# Patient Record
Sex: Female | Born: 1935 | Race: White | Hispanic: No | Smoking: Former smoker
Health system: Southern US, Community
[De-identification: ages and names within clinical notes are randomized; demographics above are authoritative.]

## PROBLEM LIST (undated history)

## (undated) DIAGNOSIS — I252 Old myocardial infarction: Secondary | ICD-10-CM

## (undated) DIAGNOSIS — J45909 Unspecified asthma, uncomplicated: Secondary | ICD-10-CM

## (undated) DIAGNOSIS — K219 Gastro-esophageal reflux disease without esophagitis: Secondary | ICD-10-CM

## (undated) DIAGNOSIS — K5792 Diverticulitis of intestine, part unspecified, without perforation or abscess without bleeding: Secondary | ICD-10-CM

## (undated) DIAGNOSIS — R0602 Shortness of breath: Secondary | ICD-10-CM

## (undated) DIAGNOSIS — I2510 Atherosclerotic heart disease of native coronary artery without angina pectoris: Secondary | ICD-10-CM

## (undated) DIAGNOSIS — Z9981 Dependence on supplemental oxygen: Secondary | ICD-10-CM

## (undated) DIAGNOSIS — J449 Chronic obstructive pulmonary disease, unspecified: Secondary | ICD-10-CM

## (undated) DIAGNOSIS — M1990 Unspecified osteoarthritis, unspecified site: Secondary | ICD-10-CM

## (undated) DIAGNOSIS — I255 Ischemic cardiomyopathy: Secondary | ICD-10-CM

## (undated) DIAGNOSIS — E039 Hypothyroidism, unspecified: Secondary | ICD-10-CM

## (undated) DIAGNOSIS — J8410 Pulmonary fibrosis, unspecified: Secondary | ICD-10-CM

## (undated) DIAGNOSIS — I10 Essential (primary) hypertension: Secondary | ICD-10-CM

## (undated) DIAGNOSIS — J189 Pneumonia, unspecified organism: Secondary | ICD-10-CM

## (undated) DIAGNOSIS — E785 Hyperlipidemia, unspecified: Secondary | ICD-10-CM

## (undated) DIAGNOSIS — I340 Nonrheumatic mitral (valve) insufficiency: Secondary | ICD-10-CM

## (undated) DIAGNOSIS — K7460 Unspecified cirrhosis of liver: Secondary | ICD-10-CM

## (undated) DIAGNOSIS — R51 Headache: Secondary | ICD-10-CM

## (undated) HISTORY — DX: Atherosclerotic heart disease of native coronary artery without angina pectoris: I25.10

## (undated) HISTORY — DX: Old myocardial infarction: I25.2

## (undated) HISTORY — PX: VARICOSE VEIN SURGERY: SHX832

## (undated) HISTORY — DX: Hypothyroidism, unspecified: E03.9

## (undated) HISTORY — PX: DILATION AND CURETTAGE OF UTERUS: SHX78

## (undated) HISTORY — PX: TONSILLECTOMY: SUR1361

## (undated) HISTORY — DX: Nonrheumatic mitral (valve) insufficiency: I34.0

## (undated) HISTORY — PX: RETINAL TEAR REPAIR CRYOTHERAPY: SHX5304

## (undated) HISTORY — PX: BACK SURGERY: SHX140

## (undated) HISTORY — DX: Ischemic cardiomyopathy: I25.5

## (undated) HISTORY — PX: APPENDECTOMY: SHX54

---

## 1997-12-08 ENCOUNTER — Other Ambulatory Visit: Admission: RE | Admit: 1997-12-08 | Discharge: 1997-12-08 | Payer: Self-pay | Admitting: Obstetrics and Gynecology

## 1998-08-09 ENCOUNTER — Ambulatory Visit (HOSPITAL_BASED_OUTPATIENT_CLINIC_OR_DEPARTMENT_OTHER): Admission: RE | Admit: 1998-08-09 | Discharge: 1998-08-09 | Payer: Self-pay | Admitting: Orthopaedic Surgery

## 1998-12-13 ENCOUNTER — Other Ambulatory Visit: Admission: RE | Admit: 1998-12-13 | Discharge: 1998-12-13 | Payer: Self-pay | Admitting: Obstetrics and Gynecology

## 1999-01-16 ENCOUNTER — Encounter: Payer: Self-pay | Admitting: Obstetrics and Gynecology

## 1999-01-16 ENCOUNTER — Encounter: Admission: RE | Admit: 1999-01-16 | Discharge: 1999-01-16 | Payer: Self-pay | Admitting: Obstetrics and Gynecology

## 1999-12-19 ENCOUNTER — Other Ambulatory Visit: Admission: RE | Admit: 1999-12-19 | Discharge: 1999-12-19 | Payer: Self-pay | Admitting: Obstetrics and Gynecology

## 2000-01-17 ENCOUNTER — Encounter: Payer: Self-pay | Admitting: Obstetrics and Gynecology

## 2000-01-17 ENCOUNTER — Encounter: Admission: RE | Admit: 2000-01-17 | Discharge: 2000-01-17 | Payer: Self-pay | Admitting: Obstetrics and Gynecology

## 2000-04-11 ENCOUNTER — Encounter: Payer: Self-pay | Admitting: *Deleted

## 2000-04-11 ENCOUNTER — Encounter: Admission: RE | Admit: 2000-04-11 | Discharge: 2000-04-11 | Payer: Self-pay | Admitting: *Deleted

## 2000-06-26 ENCOUNTER — Ambulatory Visit (HOSPITAL_COMMUNITY): Admission: RE | Admit: 2000-06-26 | Discharge: 2000-06-26 | Payer: Self-pay | Admitting: Endocrinology

## 2000-10-02 ENCOUNTER — Ambulatory Visit (HOSPITAL_COMMUNITY): Admission: RE | Admit: 2000-10-02 | Discharge: 2000-10-02 | Payer: Self-pay | Admitting: *Deleted

## 2000-10-02 ENCOUNTER — Encounter (INDEPENDENT_AMBULATORY_CARE_PROVIDER_SITE_OTHER): Payer: Self-pay | Admitting: Specialist

## 2000-11-26 ENCOUNTER — Other Ambulatory Visit: Admission: RE | Admit: 2000-11-26 | Discharge: 2000-11-26 | Payer: Self-pay | Admitting: Endocrinology

## 2000-12-19 ENCOUNTER — Other Ambulatory Visit: Admission: RE | Admit: 2000-12-19 | Discharge: 2000-12-19 | Payer: Self-pay | Admitting: Obstetrics and Gynecology

## 2001-01-20 ENCOUNTER — Encounter: Payer: Self-pay | Admitting: Obstetrics and Gynecology

## 2001-01-20 ENCOUNTER — Encounter: Admission: RE | Admit: 2001-01-20 | Discharge: 2001-01-20 | Payer: Self-pay | Admitting: Obstetrics and Gynecology

## 2002-01-21 ENCOUNTER — Encounter: Admission: RE | Admit: 2002-01-21 | Discharge: 2002-01-21 | Payer: Self-pay | Admitting: Obstetrics and Gynecology

## 2002-01-21 ENCOUNTER — Encounter: Payer: Self-pay | Admitting: Obstetrics and Gynecology

## 2002-02-12 ENCOUNTER — Encounter: Admission: RE | Admit: 2002-02-12 | Discharge: 2002-02-12 | Payer: Self-pay | Admitting: Endocrinology

## 2002-02-12 ENCOUNTER — Encounter: Payer: Self-pay | Admitting: Endocrinology

## 2003-01-13 ENCOUNTER — Ambulatory Visit (HOSPITAL_COMMUNITY): Admission: RE | Admit: 2003-01-13 | Discharge: 2003-01-13 | Payer: Self-pay | Admitting: Internal Medicine

## 2003-02-04 ENCOUNTER — Ambulatory Visit (HOSPITAL_COMMUNITY): Admission: RE | Admit: 2003-02-04 | Discharge: 2003-02-04 | Payer: Self-pay | Admitting: Obstetrics and Gynecology

## 2003-12-13 ENCOUNTER — Ambulatory Visit (HOSPITAL_COMMUNITY): Admission: RE | Admit: 2003-12-13 | Discharge: 2003-12-13 | Payer: Self-pay | Admitting: Cardiology

## 2004-01-11 ENCOUNTER — Encounter: Admission: RE | Admit: 2004-01-11 | Discharge: 2004-01-11 | Payer: Self-pay | Admitting: Endocrinology

## 2004-02-22 ENCOUNTER — Ambulatory Visit (HOSPITAL_COMMUNITY): Admission: RE | Admit: 2004-02-22 | Discharge: 2004-02-22 | Payer: Self-pay | Admitting: *Deleted

## 2005-02-27 ENCOUNTER — Other Ambulatory Visit: Admission: RE | Admit: 2005-02-27 | Discharge: 2005-02-27 | Payer: Self-pay | Admitting: Obstetrics and Gynecology

## 2005-03-01 ENCOUNTER — Ambulatory Visit (HOSPITAL_COMMUNITY): Admission: RE | Admit: 2005-03-01 | Discharge: 2005-03-01 | Payer: Self-pay | Admitting: *Deleted

## 2005-03-30 ENCOUNTER — Encounter: Admission: RE | Admit: 2005-03-30 | Discharge: 2005-03-30 | Payer: Self-pay | Admitting: Endocrinology

## 2005-10-12 ENCOUNTER — Inpatient Hospital Stay (HOSPITAL_COMMUNITY): Admission: AD | Admit: 2005-10-12 | Discharge: 2005-10-16 | Payer: Self-pay | Admitting: *Deleted

## 2005-10-15 ENCOUNTER — Encounter (INDEPENDENT_AMBULATORY_CARE_PROVIDER_SITE_OTHER): Payer: Self-pay | Admitting: Specialist

## 2005-10-17 ENCOUNTER — Ambulatory Visit: Payer: Self-pay | Admitting: Internal Medicine

## 2006-01-08 DIAGNOSIS — K5792 Diverticulitis of intestine, part unspecified, without perforation or abscess without bleeding: Secondary | ICD-10-CM

## 2006-01-08 HISTORY — DX: Diverticulitis of intestine, part unspecified, without perforation or abscess without bleeding: K57.92

## 2006-01-08 HISTORY — PX: ROTATOR CUFF REPAIR: SHX139

## 2006-05-09 ENCOUNTER — Encounter: Admission: RE | Admit: 2006-05-09 | Discharge: 2006-05-09 | Payer: Self-pay | Admitting: Obstetrics and Gynecology

## 2006-12-11 ENCOUNTER — Encounter: Admission: RE | Admit: 2006-12-11 | Discharge: 2006-12-11 | Payer: Self-pay | Admitting: Orthopedic Surgery

## 2006-12-12 ENCOUNTER — Ambulatory Visit (HOSPITAL_BASED_OUTPATIENT_CLINIC_OR_DEPARTMENT_OTHER): Admission: RE | Admit: 2006-12-12 | Discharge: 2006-12-12 | Payer: Self-pay | Admitting: Orthopedic Surgery

## 2007-07-17 ENCOUNTER — Encounter: Admission: RE | Admit: 2007-07-17 | Discharge: 2007-07-17 | Payer: Self-pay | Admitting: Obstetrics and Gynecology

## 2007-08-18 ENCOUNTER — Encounter (INDEPENDENT_AMBULATORY_CARE_PROVIDER_SITE_OTHER): Payer: Self-pay | Admitting: *Deleted

## 2007-08-18 ENCOUNTER — Ambulatory Visit (HOSPITAL_COMMUNITY): Admission: RE | Admit: 2007-08-18 | Discharge: 2007-08-18 | Payer: Self-pay | Admitting: *Deleted

## 2007-12-22 ENCOUNTER — Inpatient Hospital Stay (HOSPITAL_COMMUNITY): Admission: RE | Admit: 2007-12-22 | Discharge: 2007-12-23 | Payer: Self-pay | Admitting: Neurosurgery

## 2008-05-08 HISTORY — PX: EYE SURGERY: SHX253

## 2008-05-20 ENCOUNTER — Ambulatory Visit (HOSPITAL_BASED_OUTPATIENT_CLINIC_OR_DEPARTMENT_OTHER): Admission: RE | Admit: 2008-05-20 | Discharge: 2008-05-20 | Payer: Self-pay | Admitting: Orthopedic Surgery

## 2008-07-19 ENCOUNTER — Encounter: Admission: RE | Admit: 2008-07-19 | Discharge: 2008-07-19 | Payer: Self-pay | Admitting: Obstetrics and Gynecology

## 2008-11-08 HISTORY — PX: TOTAL KNEE ARTHROPLASTY: SHX125

## 2008-12-07 ENCOUNTER — Inpatient Hospital Stay (HOSPITAL_COMMUNITY): Admission: RE | Admit: 2008-12-07 | Discharge: 2008-12-10 | Payer: Self-pay | Admitting: Orthopaedic Surgery

## 2008-12-09 ENCOUNTER — Ambulatory Visit: Payer: Self-pay | Admitting: Surgery

## 2008-12-09 ENCOUNTER — Encounter (INDEPENDENT_AMBULATORY_CARE_PROVIDER_SITE_OTHER): Payer: Self-pay | Admitting: Orthopaedic Surgery

## 2009-02-14 ENCOUNTER — Encounter: Admission: RE | Admit: 2009-02-14 | Discharge: 2009-02-14 | Payer: Self-pay | Admitting: Neurosurgery

## 2009-08-29 ENCOUNTER — Encounter: Admission: RE | Admit: 2009-08-29 | Discharge: 2009-08-29 | Payer: Self-pay | Admitting: Obstetrics and Gynecology

## 2010-04-11 LAB — CBC
HCT: 27.7 % — ABNORMAL LOW (ref 36.0–46.0)
HCT: 29.8 % — ABNORMAL LOW (ref 36.0–46.0)
HCT: 32.8 % — ABNORMAL LOW (ref 36.0–46.0)
Hemoglobin: 10.3 g/dL — ABNORMAL LOW (ref 12.0–15.0)
Hemoglobin: 11.2 g/dL — ABNORMAL LOW (ref 12.0–15.0)
Hemoglobin: 9.5 g/dL — ABNORMAL LOW (ref 12.0–15.0)
MCHC: 34.1 g/dL (ref 30.0–36.0)
MCHC: 34.5 g/dL (ref 30.0–36.0)
MCV: 92.3 fL (ref 78.0–100.0)
MCV: 92.9 fL (ref 78.0–100.0)
MCV: 93 fL (ref 78.0–100.0)
Platelets: 168 10*3/uL (ref 150–400)
Platelets: 179 10*3/uL (ref 150–400)
Platelets: 199 10*3/uL (ref 150–400)
RBC: 3.23 MIL/uL — ABNORMAL LOW (ref 3.87–5.11)
RBC: 3.53 MIL/uL — ABNORMAL LOW (ref 3.87–5.11)
RDW: 13.6 % (ref 11.5–15.5)
RDW: 13.6 % (ref 11.5–15.5)
RDW: 13.7 % (ref 11.5–15.5)
WBC: 7.1 10*3/uL (ref 4.0–10.5)
WBC: 8.6 10*3/uL (ref 4.0–10.5)
WBC: 8.6 10*3/uL (ref 4.0–10.5)

## 2010-04-11 LAB — BASIC METABOLIC PANEL
BUN: 10 mg/dL (ref 6–23)
BUN: 10 mg/dL (ref 6–23)
BUN: 12 mg/dL (ref 6–23)
CO2: 27 mEq/L (ref 19–32)
CO2: 28 mEq/L (ref 19–32)
CO2: 29 mEq/L (ref 19–32)
Calcium: 8.3 mg/dL — ABNORMAL LOW (ref 8.4–10.5)
Calcium: 8.3 mg/dL — ABNORMAL LOW (ref 8.4–10.5)
Calcium: 8.5 mg/dL (ref 8.4–10.5)
Chloride: 101 mEq/L (ref 96–112)
Chloride: 103 mEq/L (ref 96–112)
Chloride: 105 mEq/L (ref 96–112)
Creatinine, Ser: 0.68 mg/dL (ref 0.4–1.2)
Creatinine, Ser: 0.7 mg/dL (ref 0.4–1.2)
Creatinine, Ser: 0.71 mg/dL (ref 0.4–1.2)
GFR calc Af Amer: >60 mL/min (ref >60)
GFR calc Af Amer: >60 mL/min (ref >60)
GFR calc Af Amer: >60 mL/min (ref >60)
GFR calc non Af Amer: >60 mL/min (ref >60)
GFR calc non Af Amer: >60 mL/min (ref >60)
GFR calc non Af Amer: >60 mL/min (ref >60)
Glucose, Bld: 122 mg/dL — ABNORMAL HIGH (ref 70–99)
Glucose, Bld: 133 mg/dL — ABNORMAL HIGH (ref 70–99)
Glucose, Bld: 169 mg/dL — ABNORMAL HIGH (ref 70–99)
Potassium: 3.7 mEq/L (ref 3.5–5.1)
Potassium: 3.8 mEq/L (ref 3.5–5.1)
Potassium: 4.6 mEq/L (ref 3.5–5.1)
Sodium: 133 mEq/L — ABNORMAL LOW (ref 135–145)
Sodium: 135 mEq/L (ref 135–145)
Sodium: 138 mEq/L (ref 135–145)

## 2010-04-11 LAB — PROTIME-INR
INR: 1.07 (ref 0.00–1.49)
INR: 1.81 — ABNORMAL HIGH (ref 0.00–1.49)
INR: 2.33 — ABNORMAL HIGH (ref 0.00–1.49)
Prothrombin Time: 13.8 seconds (ref 11.6–15.2)
Prothrombin Time: 20.8 seconds — ABNORMAL HIGH (ref 11.6–15.2)
Prothrombin Time: 25.4 s — ABNORMAL HIGH (ref 11.6–15.2)

## 2010-04-12 LAB — CBC
HCT: 40.9 % (ref 36.0–46.0)
MCHC: 34.3 g/dL (ref 30.0–36.0)
MCV: 92.6 fL (ref 78.0–100.0)
RBC: 4.42 MIL/uL (ref 3.87–5.11)
WBC: 6.9 10*3/uL (ref 4.0–10.5)

## 2010-04-12 LAB — COMPREHENSIVE METABOLIC PANEL
AST: 44 U/L — ABNORMAL HIGH (ref 0–37)
BUN: 16 mg/dL (ref 6–23)
CO2: 28 mEq/L (ref 19–32)
Calcium: 9.7 mg/dL (ref 8.4–10.5)
Chloride: 104 mEq/L (ref 96–112)
Creatinine, Ser: 0.82 mg/dL (ref 0.4–1.2)
GFR calc Af Amer: >60 mL/min (ref >60)
GFR calc non Af Amer: >60 mL/min (ref >60)
Total Bilirubin: 0.6 mg/dL (ref 0.3–1.2)

## 2010-04-12 LAB — URINALYSIS, ROUTINE W REFLEX MICROSCOPIC
Glucose, UA: NEGATIVE mg/dL
Ketones, ur: NEGATIVE mg/dL
Protein, ur: NEGATIVE mg/dL
Urobilinogen, UA: 0.2 mg/dL (ref 0.0–1.0)

## 2010-04-12 LAB — DIFFERENTIAL
Basophils Absolute: 0 10*3/uL (ref 0.0–0.1)
Eosinophils Relative: 1 % (ref 0–5)
Lymphocytes Relative: 20 % (ref 12–46)
Lymphs Abs: 1.4 10*3/uL (ref 0.7–4.0)
Neutro Abs: 4.8 10*3/uL (ref 1.7–7.7)
Neutrophils Relative %: 69 % (ref 43–77)

## 2010-04-12 LAB — URINE CULTURE: Colony Count: >=100000

## 2010-04-12 LAB — PROTIME-INR
INR: 0.95 (ref 0.00–1.49)
Prothrombin Time: 12.6 seconds (ref 11.6–15.2)

## 2010-04-12 LAB — TYPE AND SCREEN: ABO/RH(D): A POS

## 2010-04-12 LAB — URINE MICROSCOPIC-ADD ON

## 2010-04-12 LAB — APTT: aPTT: 34 seconds (ref 24–37)

## 2010-04-18 LAB — POCT HEMOGLOBIN-HEMACUE: Hemoglobin: 14.2 g/dL (ref 12.0–15.0)

## 2010-05-03 ENCOUNTER — Other Ambulatory Visit: Payer: Self-pay | Admitting: Orthopaedic Surgery

## 2010-05-03 DIAGNOSIS — R52 Pain, unspecified: Secondary | ICD-10-CM

## 2010-05-04 ENCOUNTER — Ambulatory Visit
Admission: RE | Admit: 2010-05-04 | Discharge: 2010-05-04 | Disposition: A | Discharge status: Home / Self Care | Payer: Medicare Other | Source: Ambulatory Visit | Attending: Orthopaedic Surgery | Admitting: Orthopaedic Surgery

## 2010-05-04 DIAGNOSIS — R52 Pain, unspecified: Secondary | ICD-10-CM

## 2010-05-04 MED ORDER — IOHEXOL 300 MG/ML  SOLN
100.0000 mL | Freq: Once | INTRAMUSCULAR | Status: AC | PRN
  Administered 2010-05-04: 100 mL via INTRAVENOUS

## 2010-05-23 NOTE — Op Note (Signed)
NAMELAURALYNN, [name]               ACCOUNT NO.:  [id number]   MEDICAL RECORD NO.:  [id number]          PATIENT TYPE:  INP   LOCATION:  3018                         FACILITY:  [facility]   PHYSICIAN:  Hewitt Shorts, M.D.DATE OF BIRTH:  [birth year]   DATE OF PROCEDURE:  12/22/2007  DATE OF DISCHARGE:                               OPERATIVE REPORT   PREOPERATIVE DIAGNOSES:  1. Multilevel, multifactorial lumbar stenosis.  2. Lumbar spondylosis.  3. Lumbar degenerative disk disease.  4. Lumbar radiculopathy.   POSTOPERATIVE DIAGNOSES:  1. Multilevel, multifactorial lumbar stenosis.  2. Lumbar spondylosis.  3. Lumbar degenerative disk disease.  4. Lumbar radiculopathy.   PROCEDURE:  L2-S1 decompressive lumbar laminectomy with decompressions  of the L2, L3, L4, L5, and S1 nerve roots bilaterally with  microdissection.   SURGEON:  Hewitt Shorts, MD   ASSISTANTS:  1. Russell L. Webb Silversmith, RN  2. Danae Orleans. Venetia Maxon, MD   ANESTHESIA:  General endotracheal.   INDICATIONS:  The patient is a 77 year old woman who presented with low  back and radicular pain.  She had extensive degenerative disk disease  and spondylosis throughout the lumbar spine with multilevel,  multifactorial lumbar stenosis with resulting back and radicular pain.  A decision was made to proceed with decompression.   PROCEDURE:  The patient was brought to the operating room and placed  under general endotracheal anesthesia.  The patient was turned to a  prone position.  Lumbar region was prepped with Betadine soap and  solution and draped in sterile fashion.  The midline was infiltrated  with local anesthetic with epinephrine and an x-ray was taken.  The L2-  S1 levels were identified and a midline incision made over the lumbar  region and carried down to the subcutaneous tissue.  Bipolar cautery and  electrocautery was used to maintain hemostasis throughout the procedure.  Dissection was carried down to the  subcutaneous tissue to the lumbar  fascia which was incised bilaterally.  The paraspinal muscles were  dissected from the spinous process and lamina in a subperiosteal  fashion.  The self-retaining retractors were placed and another x-ray  was taken and we identified the L2, L3, L4, L5, and S1 spinous process  and lamina.   Using modification with microdissection and microsurgical technique we  proceeded with the decompression.  Laminectomy was performed using  double action rongeurs, the X-Max drill and Kerrison punches.  Care was  taken to leave the underlying thecal sac undisturbed.  Edge of the bone  were waxed with a little bit of bone wax to maintain hemostasis and  Gelfoam with thrombin was used to help maintain hemostasis in the  epidural space.  There was extensive spondylotic overgrowth.  There was  significant ligamentum flavum thickening and calcification of ligamentum  flavum.  This was carefully dissected from the thecal sac and nerve  roots.  The decompression was carried out laterally using an upward-  angled curette to scrape the ligamentum flavum from the undersurface of  the lateral lamina and neural foramen and in the end good decompression  was achieved to the thecal sac  and central canal as well as the neural  foramina and nerve roots.  We were able to decompress the L2, L3, L4,  L5, and S1 nerve roots bilaterally.  Throughout the procedure the wound  was irrigated on numerous occasions with saline solution and towards the  conclusion of the procedure, it was irrigated with Bacitracin solution.  Good hemostasis was maintained throughout the procedure and after  decompression was completed and hemostasis was established, we placed  Gelfoam with thrombin in the laminectomy defect and proceeded with  closure.  Paraspinal muscles were reapproximated with interrupted undyed  1 Vicryl sutures.  Deep fascia was closed with interrupted undyed 1  Vicryl sutures.  The Scarpa  fascia closed with interrupted undyed 1  Vicryl sutures.  The subcutaneous and subcuticular were closed with  interrupted inverted 2-0 undyed Vicryl sutures, and skin was closed with  surgical staples.  The wound was dressed with Adaptic and sterile gauze.  Procedure is tolerated well.  The estimated blood loss was 75 mL.  Sponge and needle count were correct.  Following surgery, the patient  was returned back to the supine position to be reversed from the  anesthetic, extubated, and transferred to the recovery room for further  care.      Hewitt Shorts, M.D.  Electronically Signed     RWN/MEDQ  D:  12/22/2007  T:  12/23/2007  Job:  [id number]   cc:   Hewitt Shorts, M.D.

## 2010-05-23 NOTE — Op Note (Signed)
[name], [name]               ACCOUNT NO.:  [id number]   MEDICAL RECORD NO.:  [id number]          PATIENT TYPE:  AMB   LOCATION:  ENDO                         [facility]ACILITY:  [facility]   PHYSICIAN:  Georgiana Spinner, M.D.    DATE OF BIRTH:  [birth year]   DATE OF PROCEDURE:  08/18/2007  DATE OF DISCHARGE:                               OPERATIVE REPORT   PROCEDURE:  Upper endoscopy.   INDICATIONS:  GERD and GI bleeding.   ANESTHESIA:  Fentanyl 50 mcg, Versed 5 mg.   PROCEDURE:  With the patient mildly sedated in the left lateral  decubitus position, the Pentax videoscopic endoscope was inserted in the  mouth and passed under direct vision through the esophagus which  appeared normal.  There was a questionable area of Barrett's seen,  photographed and biopsied.  No evidence of esophageal varices noted.  We  entered into the stomach and blood was seen in the stomach prior to the  biopsy.  Flecks of blood seen throughout gastric body and fundus area,  but no lesions seen as we advanced the endoscope through the pylorus  into the duodenal bulb where a small amount of blood was seen here too  as  second portion duodenum appeared clear.  From this point, the  endoscope was slowly withdrawn taking circumferential views of duodenal  mucosa until the endoscope had been pulled back into the stomach and  placed in retroflexion to view the stomach from below.  The endoscope  was straightened and withdrawn, taking circumferential views of the  remaining gastric and esophageal mucosa.  The patient's vital signs and  pulse oximeter remained stable.  The patient tolerated the procedure  well without apparent complications.   FINDINGS:  Patient with chronic hoarseness and possibly reflux.  Will  start the patient on PPI to see if it affects her clinically, but also  blood noticed in the stomach, possibly related to reflux.  The distal  esophagus was mildly erythematous and biopsied.  Await biopsy  reports.  The patient will call me for results and follow-up with me as an  outpatient.           ______________________________  Georgiana Spinner, M.D.     GMO/MEDQ  D:  08/18/2007  T:  08/18/2007  Job:  [id number]

## 2010-05-23 NOTE — Op Note (Signed)
NAMEMARVETTA, [name]               ACCOUNT NO.:  [id number]   MEDICAL RECORD NO.:  [id number]          PATIENT TYPE:  AMB   LOCATION:  DSC                          FACILITY:  [facility]   PHYSICIAN:  Rodney A. Mortenson, M.D.DATE OF BIRTH:  January 03, 1928   DATE OF PROCEDURE:  12/12/2006  DATE OF DISCHARGE:                               OPERATIVE REPORT   JUSTIFICATION:  Elderly Caucasian female injured her right shoulder has  an 8 to 42-month history of shoulder pain which was getting  progressively worse.  Examination shows positive impingement sign.  Weakness to external rotation.  She has failed all conservative care.  Subsequently MRI was done and this shows a full thickness tear of the  distal supraspinatus some bone spur inferior surface of the AC joint and  secondary subacromial and subdeltoid bursitis.  Because of persistent  pain and discomfort, it is felt that surgical reconstruction was  indicated and necessary.  Complication discussed extensively  preoperatively.  Questions answered encouraged.   JUSTIFICATION FOR OUTPATIENT SURGERY:  Minimal morbidity.   PREOPERATIVE DIAGNOSIS:  Full-thickness rotator cuff tear, right  shoulder with bone spurring in the acromioclavicular joint.   POSTOPERATIVE DIAGNOSIS:  Avulsion tear biceps right shoulder; bone  spurs inferior aspect of the right distal clavicle; full thickness tear  rotator cuff right shoulder.   OPERATION:  Arthroscopic debridement and biceps right shoulder; open  acromioplasty and debride bone spur inferior aspect right distal  clavicle; open repair, full thickness tear rotator cuff right shoulder  using two Mitek anchors.   SURGEON:  Lenard Galloway. Chaney Malling, M.D.   ANESTHESIA:  General.   PROCEDURE:  The patient placed on the operating table in supine  position.  After satisfactory general  anesthesia, the patient was  placed in semi-sitting position.  The right upper extremity shoulder  prepped with DuraPrep, draped  out in the usual manner.  An arthroscope  was introduced into the posterior portal and an anterior operative  portal was made.  Very careful examination the shoulder was done.  The  humeral head and articular cartilage of the glenoid appeared normal as  did the labrum.  There was marked fraying of the biceps as it exited the  shoulder and there was a full thickness tear of the rotator cuff which  was seen from the articular surface.  Through the anterior portal a  radial resector was inserted and the marked fraying and tearing the  biceps was a very carefully debrided.  The undersurface of the rotator  cuff was also debrided.  The arthroscope was then placed in subacromial  space and the tear in the rotator cuff could clearly be seen again.  This was marked with spinal needle.   Next a saber-cut incision made over the anterolateral aspect of the  right shoulder.  Skin edges were retracted.  Bleeders were coagulated.  Deltoid fibers released off about 1 cm of the distal clavicle.  The  deltoid fibers were then split and self-retaining retractor put in  place.  The bursa was excised.  An acromioplasty was done with the power  saw and this  gave excellent access to the shoulder.  A large bone spur  was then felt on inferior surface of the distal clavicle and this could  be seen directly.  The saw was then used to debride the undersurface of  the distal clavicle.  Once this accomplished a rasp was inserted and  this was smoothed off.  The bursa was excised and a full-thickness tear  clearly could be seen.  This pulled off the footprint and retracted for  about a centimeter.  The footprint was then debrided with a rongeur.  A  Mitek anchor was then placed into the proximal humerus and suture  brought through the rotator cuff tear and this pulled down distally.  Excellent anatomic closure was achieved and the footprint was totally  covered with the rotator cuff.  Because of significantly soft  bone a  second Mitek anchor was used to reinforce the first repair and this was  also brought through the distal rotator cuff and anchored down more  securely.  Shoulder was then put through full range of motion.  There is  no impingement whatsoever.  Pulsating lavage was then used to irrigate  the shoulder with copious amounts saline solution.  Deltoid fibers then  reattached with 0 Vicryl.  2-0 Vicryl used to close subcutaneous tissue  and stainless steel staples used to close the skin.  Sterile dressings  were applied and the patient returned recovery room in excellent  condition.  Technically I was extremely pleased with the surgical  repair.   DISPOSITION:  1. To my office on Wednesday.  2. Percocet for pain.  3. Will start physical therapy next week.      Rodney A. Chaney Malling, M.D.  Electronically Signed     RAM/MEDQ  D:  12/12/2006  T:  12/12/2006  Job:  [id number]

## 2010-05-23 NOTE — Op Note (Signed)
[name], [name]               ACCOUNT NO.:  [id number]   MEDICAL RECORD NO.:  [id number]          PATIENT TYPE:  AMB   LOCATION:  DSC                          FACILITY:  [facility]   PHYSICIAN:  [name], M.D.DATE OF BIRTH:  [birth year]   DATE OF PROCEDURE:  05/20/2008  DATE OF DISCHARGE:                               OPERATIVE REPORT   JUSTIFICATION:  A 77 year old female with long history of left knee  pain.  She has been initially treated with cortisone injections for  about some time.  She continued to have significant pain and discomfort,  and injections became ineffective.  An MRI shows a tear at medial  meniscus and progressive osteoarthritis medial compartment, left knee.  Because of persistent pain and discomfort, she now wish to have  arthroscopic evaluation and hopes the debridement of the torn meniscus  were less in her pain.  She fully understands, no guarantee could be  given.  Pain could be caused by arthritic changes, which cannot be  addressed through the arthroscope.  Questions were answered and  encouraged preoperatively.  Complication were discussed extensively and  the patient wished to proceed.   JUSTIFICATION FOR OUTPATIENT SURGERY:  Minimal morbidity.   PREOPERATIVE DIAGNOSES:  1. Osteoarthritis, medial compartment, left knee.  2. Tear of medial meniscus, left knee.   POSTOPERATIVE DIAGNOSES:  1. Osteoarthritis, medial compartment, left knee, extensor tendon and      posterior horn, medial meniscus, left knee.  2. Tear, anterior horn and lateral meniscus, left knee.   OPERATION:  1. Diagnostic arthroscopy.  2. Partial debridement, anterior horn, lateral meniscus.  3. Partial debridement, posterior horn and medial meniscus, left knee.   SURGEON:  [name]. Mortenson, MD   ANESTHESIA:  MAC followed by LMA.   PATHOLOGY:  The arthroscope of knee with very careful examination was  undertaken.  The patellofemoral joint showed some early  arthritic  changes, some mild loss of articular cartilage.  Anterior cruciate  ligament was normal.  In lateral compartment, there was tear of the  anterior horn of the lateral meniscus.  The articular cartilage over  lateral femoral condyle and lateral tibial plateau appeared fairly  normal.  In the medial compartment, there was huge areas of total loss  of articular cartilage over the weightbearing area of medial femoral  condyle, nd medial tibial plateau, and extensive complex tear of the  posterior half of the medial meniscus.  This was markedly frayed and  torn.   PROCEDURE:  The patient was placed on the operating table in supine  position.  Pneumatic tourniquet was brought to the left upper thigh.  Left leg was placed in a leg holder.  Entire left lower extremity was  prepped with DuraPrep and draped down in the usual manner.  An infusion  cannula was placed in superior medial pouch and knee distended with  saline.  Anteromedial and anterolateral portals were made and the  arthroscope was introduced.  Findings were described as above.  Attention was first turned to the lateral compartment.  The anterior  horn was then debrided.  Once this  was done to my satisfaction, intra-  articular shaver was introduced and this was smoothed and balanced.  About 50% of width to the anterior horn was preserved and the posterior  two-thirds of lateral meniscus was preserved.  An excellent  decompression was achieved.  Attention was then turned to the medial  compartment.  Through both medial and lateral portals, a series of  baskets were inserted and markedly frayed and torn.  Posterior third of  medial meniscus was debrided back to smooth and stable rim.  Once this  was accomplished, intra-articular shaver was introduced and the  remaining rim was then smoothed and balanced and nice transition to the  anterior half of the medial meniscus, which was preserved.  Again, there  were large areas of  total articular cartilage loss in the medial  compartment.  Knee was then filled with Marcaine.  A large bulky  pressure dressing applied and the patient returned to recovery room in  nice condition.  Technically, procedure went extremely well.   DISPOSITION:  1. Percocet for pain.  2. Usual postoperative instructions.  3. To my office on Wednesday for followup exam.  4. Any problem, she is instructed to call me.      [name], M.D.  Electronically Signed     RAM/MEDQ  D:  05/20/2008  T:  05/20/2008  Job:  [id number]

## 2010-05-26 NOTE — H&P (Signed)
NAMEAVONDA, TOSO NO.:  [id number]   MEDICAL RECORD NO.:  [id number]          PATIENT TYPE:  INP   LOCATION:  5739                         FACILITY:  [facility]   PHYSICIAN:  Georgiana Spinner, M.D.    DATE OF BIRTH:  11/14/1927   DATE OF ADMISSION:  10/12/2005  DATE OF DISCHARGE:                                HISTORY & PHYSICAL   [name] is a 77 year old lady whom I have known for a number of years who  has a past medical history of primary biliary cirrhosis with a positive ANA  and she presented today stating that yesterday in the evening she had six  bloody bowel movements.  They were painless.  She thought that the stools  were bright red and she subsequently woke up this morning and had another  blood bowel movement early on about 7 a.m. and was seen in my office around  3 p.m.  The patient denies abdominal pain, vomiting, previous GI bleeding  except for a small amount of bleeding about a year ago.  She does not smoke.  Does not drink alcohol.  As stated, denies abdominal pain, weight loss,  fever, chills.  In addition to her past medical history of primary biliary  cirrhosis, she has had hypothyroidism on Synthroid 0.125 mg daily.  She  takes Restasis eye drops, Estrace 1 mg daily, Provera 2.5 mg daily, aspirin  81 mg, Aleve up to 3 times daily, and she has no known medical allergies.  She does not smoke.  Does not drink.  Family history noncontributory at this  time.  She has never really had a colonoscopy.  Has had a flexible  sigmoidoscopy many years ago, and had refused most recently a colonoscopy  within this year.  She did have an endoscopy done by me earlier this year  because of thickening seen on CT scan and the endoscopy done earlier this  year showed no esophageal or gastric varices.  but rather a loose wrap of  the GE junction around the endoscope indicating laxity of the GE junction  and otherwise a negative examination.  The patient denies chest  pain,  shortness of breath.  Has had some lightheadedness when she took her shower  earlier but did not pass out.  When I saw her today, she was an alert,  oriented female who appeared in no distress and on exam her weight is 155  pounds which is stable.  Blood pressure was 130/74 by the nurse and I got a  blood pressure of 150/90, both recumbent and upright, with a pulse of 88,  both recumbent and upright.  HEENT is without jaundice.  The thyroid was not  enlarged.  No bruits appreciated in neck.  Lungs were clear.  Heart regular  rhythm without murmur, gallop, or rub.  Abdomen was nontender.  She does  have 1 to 2+ peripheral edema which she says she has had all summer.  Most  recent laboratory studies done here showed earlier this year a prothrombin  time of 11.6 seconds and in January her hematocrit was 39,  a CMET was  entirely normal, a platelet count was 250,000, and her SGOT was 71-a normal  being 51, SGPT 63-normal being 51, and interestingly enough her alkaline  phosphatase was 146 which was normal.  Of note, she has had a diagnosis of  primary biliary cirrhosis and CT scan has shown an irregular surface with  relative enlargement of the left lobe compared to the right and this was  about a year and a half ago.  There was no evidence of portal hypertension.  No splenomegaly.  No ascites was noted.  The remainder of the examination  other than the ascending colon diverticulosis was unremarkable.  Impression  at this time, acute GI bleeding probably from diverticulosis of the colon.  It appears to have slowed or even stopped at this point with the last blood  per rectum being at least 8 hours ago and no orthostatic findings are noted  at this time but it was felt that best management of this patient would be  to admit her to the hospital for further observation and plan to do  colonoscopy electively hopefully on Monday.  We explained this all to her  and she is agreeable to this.  We  explained the procedure, the risks,  benefits, rationale, and she is also agreeable to this.  Further management  will be contained in the orders that we gave to her to take to the hospital.          ______________________________  Georgiana Spinner, M.D.    GMO/MEDQ  D:  10/12/2005  T:  10/13/2005  Job:  [id number]

## 2010-05-26 NOTE — Op Note (Signed)
NAMEELLAMARIE, NAEVE NO.:  [id number]   MEDICAL RECORD NO.:  [id number]          PATIENT TYPE:  INP   LOCATION:  5739                         FACILITY:  [facility]   PHYSICIAN:  Georgiana Spinner, M.D.    DATE OF BIRTH:  01-27-1927   DATE OF PROCEDURE:  10/15/2005  DATE OF DISCHARGE:                                 OPERATIVE REPORT   PROCEDURE:  Flexible sigmoidoscopy.   INDICATIONS:  GI bleeding.   ANESTHESIA:  Fentanyl 25 mcg, Versed 2 mg.   PROCEDURE:  With the patient mildly sedated in the left lateral decubitus  position, the Olympus videoscopic colonoscope was inserted into the rectum,  passed under direct vision to the sigmoid colon, at which point we  encountered a turn that we could not traverse with the pediatric colonoscope  and elected, therefore, to terminate the procedure and withdraw the  colonoscope taking circumferential views of remaining colonic mucosa,  stopping to photograph some of many diverticula seen in the sigmoid colon  until we reached the rectum, which appeared normal on direct and showed  hemorrhoids on retroflexed view.  The endoscope was straightened and  withdrawn.  The patient's vital signs, pulse oximeter remained stable.  The  patient tolerated the procedure well and without apparent complications.   FINDINGS:  1. Numerous diverticula of the sigmoid colon.  2. Internal hemorrhoids.  3. Otherwise an unremarkable examination.   PLAN:  Will proceed with barium enema.           ______________________________  Georgiana Spinner, M.D.     GMO/MEDQ  D:  10/15/2005  T:  10/16/2005  Job:  [id number]

## 2010-05-26 NOTE — Op Note (Signed)
NAMENATIA, FAHMY NO.:  [id number]   MEDICAL RECORD NO.:  [id number]          PATIENT TYPE:  INP   LOCATION:  5739                         FACILITY:  [facility]   PHYSICIAN:  Georgiana Spinner, M.D.    DATE OF BIRTH:  08/29/1927   DATE OF PROCEDURE:  DATE OF DISCHARGE:                                 OPERATIVE REPORT   PROCEDURE:  Upper endoscopy.   INDICATIONS:  GI bleed.   ANESTHESIA:  Fentanyl 50 mcg, Versed 4 mg.   PROCEDURE:  With the patient mildly sedated in the left lateral decubitus  position, the Olympus videoscopic endoscope was inserted in the mouth and  passed under direct vision through the esophagus which appeared normal.  There was no evidence of varices question of small area of Barrett's  esophagus that was photographed and biopsied.  We entered into the stomach,  fundus, body, antrum, duodenal bulb, second portion duodenum appeared  normal.  From this point the endoscope was slowly withdrawn taking  circumferential views of duodenal mucosa until the endoscope had been pulled  back into the stomach, placed in retroflexion to view the stomach from below  and once again a loose wrap of the GE junction was noted.  The endoscope was  straightened and withdrawn taking circumferential views of the remaining  gastric and esophageal mucosa.  The patient's vital signs, pulse oximeter  remained stable.  The patient tolerated procedure well without apparent  complications.   FINDINGS:  Loose wrap of the GE junction, rule out Barrett's esophagus.  Await biopsy report.  The patient will call me for results and follow-up  with me as an outpatient.  Proceed to colonoscopy.           ______________________________  Georgiana Spinner, M.D.     GMO/MEDQ  D:  10/15/2005  T:  10/16/2005  Job:  [id number]

## 2010-05-26 NOTE — Op Note (Signed)
NAMENYKERIA, MEALING               ACCOUNT NO.:  [id number]   MEDICAL RECORD NO.:  [id number]          PATIENT TYPE:  AMB   LOCATION:  ENDO                         FACILITY:  [facility]   PHYSICIAN:  Georgiana Spinner, M.D.    DATE OF BIRTH:  08/19/1927   DATE OF PROCEDURE:  03/01/2005  DATE OF DISCHARGE:                                 OPERATIVE REPORT   PROCEDURE:  Upper endoscopy.   INDICATIONS:  Thickening of the esophagus seen on CT scan, rule out GERD,  rule out esophageal varices.   ANESTHESIA:  Demerol 20, Versed 2 mg.   PROCEDURE:  With the patient mildly sedated in the left lateral decubitus  position, the Olympus videoscopic endoscope was inserted in the mouth and  passed under direct vision through the esophagus which appeared normal.  There was no evidence of varices.  We entered into the stomach, the fundus,  body, antrum, duodenal bulb, and second portion duodenum appeared normal.  From this point, the endoscope was slowly withdrawn taking circumferential  views of duodenal mucosa until the endoscope was pulled back into the  stomach and placed in retroflexion to view the stomach from below.  The  endoscope was straightened and withdrawn taking circumferential views of the  remaining gastric and esophageal mucosa.  The patient's vital signs and  pulse oximeter remained stable. The patient tolerated procedure well without  apparent complications.   FINDINGS:  Other than a loose wrap of the GE junction around the endoscope  indicating laxity of the GE junction, this was a negative examination.   PLAN:  Have the patient follow-up with me on as-needed basis.           ______________________________  Georgiana Spinner, M.D.     GMO/MEDQ  D:  03/01/2005  T:  03/01/2005  Job:  [id number]

## 2010-05-26 NOTE — Procedures (Signed)
[facility]  Patient:    [name], [name] Visit Number: [id number] MRN: [id number]          Service Type: END Location: ENDO Attending Physician:  Sabino Gasser Dictated by:   Sabino Gasser, M.D. Proc. Date: 10/02/00 Admit Date:  10/02/2000                             Procedure Report  PROCEDURE PERFORMED:  Upper endoscopy.  ENDOSCOPIST:  Sabino Gasser, M.D.  INDICATIONS FOR PROCEDURE:  The patient is here for endoscopy for evaluation of blood in stool and evaluation of varices.  She is known to have primary biliary cirrhosis.  ANESTHESIA:  Demerol 50 mg, Versed 5 mg.  DESCRIPTION OF PROCEDURE:  With the patient mildly sedated in the left lateral decubitus position, the Olympus video endoscope was inserted in the mouth and passed under direct vision through the esophagus which appeared normal.  We entered into the stomach.  The fundus, body, antrum, duodenal bulb and second portion of the duodenum were visualized.  Photographs were taken.  From this point, the endoscope was slowly withdrawn taking circumferential views of the entire duodenal mucosa until the endoscope had been pulled back into the stomach and placed on retroflexion to view the stomach from below and this appeared normal.  The endoscope was then straightened and pulled back.  It was noted that before we entered into the stomach, some blood streaking was seen in the stomach. The endoscope was then slowly withdrawn, taking circumferential views of the entire gastric and esophageal mucosa, stopping in the fundus to biopsy randomly gastric and subsequently esophageal mucosa which appeared grossly unremarkable.  Patients vital signs and pulse oximeter remained stable.  The patient tolerated the procedure well without apparent complications.  FINDINGS:  Blood in the stomach, the etiology of which is not clear from this examination.  Random biopsies of stomach and esophagus taken.  No varices,  no signs of passive gastropathy.  PLAN:  Will have patient call me for results of biopsy and follow up with me as an outpatient. Dictated by:   Sabino Gasser, M.D. Attending Physician:  Sabino Gasser DD:  10/02/00 TD:  10/02/00 Job: [id number]

## 2010-05-26 NOTE — Op Note (Signed)
[name], [name] NO.:  [id number]   MEDICAL RECORD NO.:  [id number]          PATIENT TYPE:  INP   LOCATION:  5739                         FACILITY:  [facility]   PHYSICIAN:  Georgiana Spinner, M.D.    DATE OF BIRTH:  1927/07/08   DATE OF PROCEDURE:  DATE OF DISCHARGE:  10/16/2005                                 OPERATIVE REPORT   Ms. [name] is a 77 year old lady admitted with a history of red blood per  rectum.  See history and physical for details, but appeared to stop when she  got to the hospital.  She remained stable without orthostatic changes.  The  patient had a hemoglobin 12.2 and hematocrit 35.6. PT/PTT was normal.  CMP  showed SGOT of 44, SGPT of 50, alkaline phosphatase of 165.  BUN 24, glucose  125.  A PTT was 40, slightly above normal.  The patient had a history of  primary biliary cirrhosis followed as an outpatient with no evidence of  portal hypertension but she did have a nodular liver on CT scan.   CLINICAL COURSE:  As stated, no further bleeding was noted over the hospital  stay. The patient remained stable on a liquid diet. On October 8, she  underwent endoscopy and colonoscopy. Endoscopy showed question of Barrett's  esophagus and biopsy was pending at the time of discharge.  Flexible  sigmoidoscopy was performed and showed diverticulosis of the sigmoid colon  and internal hemorrhoids.  The scope could not be advanced above this level  due to tortuosity of the colon.  A barium enema was performed and it showed  a large number of diverticula throughout the colon, most numerous in  transverse, descending and sigmoid colon, and no other lesions noted.  The  terminal ileum appeared normal.  No masses were noted and she was status  post appendectomy.  The patient continued to do well on a regular diet and  she was discharged home on her admitting medications with outpatient follow-  up in 4-6 weeks in my office.  At the time of discharge, hemoglobin was  10.9  and she was advised to start on vitamins with iron, eat a diet rich in iron,  and resume previous medications.   DIAGNOSIS:  Gastrointestinal bleeding, presumably from diverticulosis.   PLAN:  Follow clinically.  The patient was in improved condition.           ______________________________  Georgiana Spinner, M.D.     GMO/MEDQ  D:  10/16/2005  T:  10/17/2005  Job:  [id number]

## 2010-10-13 LAB — HEPATIC FUNCTION PANEL
ALT: 50 U/L — ABNORMAL HIGH (ref 0–35)
AST: 45 U/L — ABNORMAL HIGH (ref 0–37)
Albumin: 3.8 g/dL (ref 3.5–5.2)
Alkaline Phosphatase: 136 U/L — ABNORMAL HIGH (ref 39–117)
Bilirubin, Direct: 0.2 mg/dL (ref 0.0–0.3)
Indirect Bilirubin: 0.6 mg/dL (ref 0.3–0.9)
Total Bilirubin: 0.8 mg/dL (ref 0.3–1.2)
Total Protein: 6.9 g/dL (ref 6.0–8.3)

## 2010-10-13 LAB — BASIC METABOLIC PANEL
BUN: 15 mg/dL (ref 6–23)
CO2: 28 mEq/L (ref 19–32)
Calcium: 9 mg/dL (ref 8.4–10.5)
Chloride: 104 mEq/L (ref 96–112)
Creatinine, Ser: 0.78 mg/dL (ref 0.4–1.2)
GFR calc Af Amer: >60 mL/min (ref >60)
GFR calc non Af Amer: >60 mL/min (ref >60)
Glucose, Bld: 102 mg/dL — ABNORMAL HIGH (ref 70–99)
Potassium: 3.3 mEq/L — ABNORMAL LOW (ref 3.5–5.1)
Sodium: 137 mEq/L (ref 135–145)

## 2010-10-13 LAB — CBC
HCT: 42.2 % (ref 36.0–46.0)
Hemoglobin: 14.2 g/dL (ref 12.0–15.0)
MCHC: 33.7 g/dL (ref 30.0–36.0)
MCV: 92.1 fL (ref 78.0–100.0)
Platelets: 229 10*3/uL (ref 150–400)
RBC: 4.59 MIL/uL (ref 3.87–5.11)
RDW: 13.3 % (ref 11.5–15.5)
WBC: 6.3 10*3/uL (ref 4.0–10.5)

## 2011-02-05 ENCOUNTER — Other Ambulatory Visit: Payer: Self-pay | Admitting: Orthopaedic Surgery

## 2011-02-05 DIAGNOSIS — T148XXA Other injury of unspecified body region, initial encounter: Secondary | ICD-10-CM

## 2011-02-06 ENCOUNTER — Ambulatory Visit
Admission: RE | Admit: 2011-02-06 | Discharge: 2011-02-06 | Disposition: A | Discharge status: Home / Self Care | Payer: Medicare Other | Source: Ambulatory Visit | Attending: Orthopaedic Surgery | Admitting: Orthopaedic Surgery

## 2011-02-06 DIAGNOSIS — T148XXA Other injury of unspecified body region, initial encounter: Secondary | ICD-10-CM

## 2011-04-12 HISTORY — PX: TRANSTHORACIC ECHOCARDIOGRAM: SHX275

## 2011-06-18 ENCOUNTER — Encounter (HOSPITAL_COMMUNITY): Payer: Self-pay

## 2011-06-21 NOTE — H&P (Signed)
CHIEF COMPLAINT:  Painful right knee.   HISTORY:   [name] is seen today for evaluation of her right knee. She has had long-standing problems with her right knee for many months. She was seen back in January of this year with a 4 month history of extreme pain and discomfort in the right knee after a twisting injury when she bent over to put something in the garbage. She twisted her knee and had severe medial pain in the right knee since that time. She has had already a left total knee replacement with fairly good results. In regards to her right knee though she is having this constant right knee pain which is more throbbing and aching pain with occasional sharpness. It wakes her at nighttime. She is now having pain with every step as well as with activities of daily living. She has tried multiple medications and even still uses Motrin and Aleve. She is on glucosamine and chondroitin. All of them, however, are not helping as much anymore. She denies any neurovascular compromise. She is seen today for reevaluation of her right knee.  PAST MEDICAL HISTORY:   Past medical history and general health is fair.   PAST SURGICAL HISTORY:   Surgeries have included that of a tonsillectomy and appendectomy. She has had right shoulder surgery in December 2008, a tour retina in 2004 in the left eye. She had surgery on her lumbar spine at L2-3 and 4-5 in December of 2009. Left knee replacement in November of 2010. She did have diverticulitis and was hospitalized back in 2008 for 4 days. She has had 5 children.   CURRENT MEDICATION:   Synthroid 112 mcg per day. Azor 5/20 mg daily. Mag oxide 400 mg daily. Aspirin 81 mg daily. Motrin and Aleve every other week as needed. Fish oil 1,000 mg a day. Glucosamine/chondroitin triple strength daily. Vitamin E 400 international units 2 daily. Calcium and vitamin D 600 mg daily. Estradiol 0.5 nightly. Medroxy progesterone 2.5 mg nightly. Motrin PM 200 mg 2  nightly. Triamterene/hydrochlorothiazide 37.5 one daily. Tramadol 50 mg every 6-8 hours as needed for pain. Prilosec 20 mg daily.  Allergies: None known.  REVIEW OF SYSTEMS:   A 14 point review of systems unremarkable except for glasses and this chronic voice change which she feels is on the basis of her asthma and COPD. She does have dyspnea on exertion when she is trying to climb and do strenuous activities. She did have pneumonia in 2010 and bronchitis previously. She has been classified as COPD and does have asthma chronically for the past 12-15 years. She has had hypertension for just several months and has been treated with that. 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 She has hypothyroidism and is presently on Synthroid. She does have chronic ankle swelling but her physician does not feel that it is on the basis of her heart. She had a bladder infection in 1969. Migraines without severe pain. All other symptomatology is denied.  FAMILY HISTORY:  Family history reveals a mother who died at 53 from peritonitis. Father died in his 30s from heart disease. Brother died at age 39 from myocardial infarction. She has 4 sisters that are all deceased, one at 29, the second at 19, third at 36, and the fourth one died in her 26s but she had multiple sclerosis.  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  She smoked cigarettes for 2 years when she was 78 years of age. None since. She drinks maybe once yearly on the holiday.  PHYSICAL EXAM:  Examination today reveals an 78 year old white female. Well-developed, well-nourished, alert, pleasant and cooperative in moderate distress secondary to right knee pain.  She is 5 foot 4 inches and weighs 150 pounds with a BMI of 25.7.  Vital signs reveal temperature 97.6, pulse 89, respirations 16, blood pressure 141/78.  Head is  normocephalic. Eyes: pupils equal, round and react to light and accommodation with extraocular movements intact.   Neck was supple; no bruits were noted. Chest had fair expansion. Lungs had bilateral inspiratory wheezes. Cardiac had a regular rhythm and rate; normal S1-S2; no murmur was noted. Pulses were 1+ bilateral and symmetrical in the dorsalis pedis. Abdomen is scaphoid, soft, nontender; no mass palpable. Normal bowel sounds present. CNS oriented x3 and cranial nerves II through XII grossly intact. Skin is intact over the right knee. Musculoskeletal: range of motion is from 5 degrees to about 90-95 degrees. She does have an effusion. A Baker's cyst is palpable posteriorly. She has pseudo-laxity with valgus stressing at the medial joint line. Crepitus with range of motion. Smooth motion of the right hip and left hip.  X-RAYS: 3 views from 02-05-11 reveals bone-on-bone medial compartment OA with periarticular spurring. She does have some patellofemoral OA also.  CLINICAL IMPRESSION:   1.    End-stage osteoarthritis of the right knee. 2.    Hypothyroidism. 3.    COPD. 4.    History of asthma. 5.    Hypertension. 6.    Nonalcoholic cirrhosis. 7.    Chronic ankle swelling. 8.    Migraines.   recommendations: At this time she is to the point where she wants to have the knee replaced. We have discussed conservative versus operative treatment. She would like to proceed with total joint replacement. Procedure risks and benefits were fully explained to her in detail and she is understanding. She would like to proceed with this in the near future. All questions were answered.   Oris Drone Aleda Grana [facility] [phone]  06/21/2011 8:03 PM

## 2011-06-22 ENCOUNTER — Ambulatory Visit (HOSPITAL_COMMUNITY)
Admission: RE | Admit: 2011-06-22 | Discharge: 2011-06-22 | Disposition: A | Discharge status: Home / Self Care | Payer: Medicare Other | Source: Ambulatory Visit | Attending: Orthopaedic Surgery | Admitting: Orthopaedic Surgery

## 2011-06-22 DIAGNOSIS — M79609 Pain in unspecified limb: Secondary | ICD-10-CM | POA: Insufficient documentation

## 2011-06-22 DIAGNOSIS — Z9889 Other specified postprocedural states: Secondary | ICD-10-CM

## 2011-06-22 DIAGNOSIS — R52 Pain, unspecified: Secondary | ICD-10-CM

## 2011-06-22 NOTE — Progress Notes (Addendum)
VASCULAR LAB PRELIMINARY  PRELIMINARY  PRELIMINARY  PRELIMINARY  Bilateral lower extremity venous duplex completed.    Preliminary report:  Bilateral:  No evidence of DVTor superficial thrombosis. There is an area of mixed echoes and irregular shape in the right popliteal fossa consistent with a Baker's cyst. Due to size and shape measurement of 4cm X 1.5 cm may be slightly inaccurate. Appears larger and will not fit the screen. Probably larger than 4 cm.  Called report to [facility], Loel Betancur D, RVS 06/22/2011, 11:44 AM

## 2011-06-26 ENCOUNTER — Encounter (HOSPITAL_COMMUNITY)
Admission: RE | Admit: 2011-06-26 | Discharge: 2011-06-26 | Disposition: A | Discharge status: Home / Self Care | Payer: Medicare Other | Source: Ambulatory Visit | Attending: Orthopaedic Surgery | Admitting: Orthopaedic Surgery

## 2011-06-26 ENCOUNTER — Encounter (HOSPITAL_COMMUNITY): Payer: Self-pay

## 2011-06-26 HISTORY — DX: Headache: R51

## 2011-06-26 HISTORY — DX: Essential (primary) hypertension: I10

## 2011-06-26 HISTORY — DX: Hyperlipidemia, unspecified: E78.5

## 2011-06-26 HISTORY — DX: Gastro-esophageal reflux disease without esophagitis: K21.9

## 2011-06-26 HISTORY — DX: Chronic obstructive pulmonary disease, unspecified: J44.9

## 2011-06-26 HISTORY — DX: Diverticulitis of intestine, part unspecified, without perforation or abscess without bleeding: K57.92

## 2011-06-26 HISTORY — DX: Pulmonary fibrosis, unspecified: J84.10

## 2011-06-26 HISTORY — DX: Pneumonia, unspecified organism: J18.9

## 2011-06-26 HISTORY — DX: Unspecified cirrhosis of liver: K74.60

## 2011-06-26 HISTORY — DX: Shortness of breath: R06.02

## 2011-06-26 HISTORY — DX: Unspecified asthma, uncomplicated: J45.909

## 2011-06-26 HISTORY — DX: Unspecified osteoarthritis, unspecified site: M19.90

## 2011-06-26 LAB — CBC
HCT: 38.8 % (ref 36.0–46.0)
MCH: 29.7 pg (ref 26.0–34.0)
MCHC: 32.7 g/dL (ref 30.0–36.0)
MCV: 90.7 fL (ref 78.0–100.0)
RDW: 13.9 % (ref 11.5–15.5)

## 2011-06-26 LAB — PROTIME-INR
INR: 1.09 (ref 0.00–1.49)
Prothrombin Time: 14.3 seconds (ref 11.6–15.2)

## 2011-06-26 LAB — DIFFERENTIAL
Basophils Relative: 1 % (ref 0–1)
Lymphs Abs: 1.7 10*3/uL (ref 0.7–4.0)
Monocytes Relative: 7 % (ref 3–12)
Neutro Abs: 4.6 10*3/uL (ref 1.7–7.7)
Neutrophils Relative %: 66 % (ref 43–77)

## 2011-06-26 LAB — COMPREHENSIVE METABOLIC PANEL
Albumin: 4.1 g/dL (ref 3.5–5.2)
BUN: 39 mg/dL — ABNORMAL HIGH (ref 6–23)
Creatinine, Ser: 1.42 mg/dL — ABNORMAL HIGH (ref 0.50–1.10)
Total Protein: 7.7 g/dL (ref 6.0–8.3)

## 2011-06-26 LAB — URINALYSIS, ROUTINE W REFLEX MICROSCOPIC
Hgb urine dipstick: NEGATIVE
Leukocytes, UA: NEGATIVE
Specific Gravity, Urine: 1.01 (ref 1.005–1.030)
Urobilinogen, UA: 0.2 mg/dL (ref 0.0–1.0)

## 2011-06-26 NOTE — Progress Notes (Signed)
Pt is followed by [facility].  Cardiac Clearance in on the chart dated 04/24/11. Current EKG, Chest X- Ray, 2 D Echo and Pulmonary Function results are on the chart.

## 2011-06-26 NOTE — Pre-Procedure Instructions (Signed)
[facility]  06/26/2011   Your procedure is scheduled on:  Tuesday, June 25th.  Report to [facility] at 5:30 AM.  Call this number if you have problems the morning of surgery: [phone]   Remember:   Do not eat food or drink any liquid after midnight.   Take these medicines the morning of surgery with A SIP OF WATER: Levothyroxine (Synthroid).   Use Tiotropium (Sprivia) Inhaler and may use Albuterol Inhaler if needed, bring Albuterol inhaler to the hospital with you.   Stop taking:  Aspirin, Glucos- Chond- Sterol-Fish Oil (Glucosamine Plus), Ibuprofen Diphenhydramine ( Advil PM), Meloxicam (Mobic) and Omega 3 Fatty Acids (Fish Oil): today, June 18th.   Do not wear jewelry, make-up or nail polish.  Do not wear lotions, powders, or perfumes. You may wear deodorant.  Do not shave 48 hours prior to surgery. Men may shave face and neck.  Do not bring valuables to the hospital.  Contacts, dentures or bridgework may not be worn into surgery.  Leave suitcase in the car. After surgery it may be brought to your room.  For patients admitted to the hospital, checkout time is 11:00 AM the day of discharge.   Patients discharged the day of surgery will not be allowed to drive home.  Name and phone number of your driver: NA    Special Instructions: CHG Shower Use Special Wash: 1/2 bottle night before surgery and 1/2 bottle morning of surgery.   Please read over the following fact sheets that you were given: Pain Booklet, Coughing and Deep Breathing, Blood Transfusion Information, MRSA Information and Surgical Site Infection Prevention

## 2011-06-27 LAB — URINE CULTURE

## 2011-07-02 MED ORDER — CEFAZOLIN SODIUM 1-5 GM-% IV SOLN
1.0000 g | INTRAVENOUS | Status: AC
  Administered 2011-07-03: 1 g via INTRAVENOUS

## 2011-07-02 MED ORDER — ACETAMINOPHEN 10 MG/ML IV SOLN
1000.0000 mg | Freq: Once | INTRAVENOUS | Status: AC
  Administered 2011-07-03: 1000 mg via INTRAVENOUS
  Filled 2011-07-02: qty 100

## 2011-07-03 ENCOUNTER — Encounter (HOSPITAL_COMMUNITY)
Admission: RE | Disposition: A | Discharge status: Skilled Nursing Facility | Payer: Self-pay | Source: Ambulatory Visit | Attending: Orthopaedic Surgery

## 2011-07-03 ENCOUNTER — Encounter (HOSPITAL_COMMUNITY): Payer: Self-pay | Admitting: *Deleted

## 2011-07-03 ENCOUNTER — Ambulatory Visit (HOSPITAL_COMMUNITY): Payer: Medicare Other | Admitting: Anesthesiology

## 2011-07-03 ENCOUNTER — Encounter (HOSPITAL_COMMUNITY): Payer: Self-pay | Admitting: Anesthesiology

## 2011-07-03 ENCOUNTER — Inpatient Hospital Stay (HOSPITAL_COMMUNITY): Payer: Medicare Other

## 2011-07-03 ENCOUNTER — Encounter (HOSPITAL_COMMUNITY): Payer: Self-pay | Admitting: Internal Medicine

## 2011-07-03 ENCOUNTER — Inpatient Hospital Stay (HOSPITAL_COMMUNITY)
Admission: RE | Admit: 2011-07-03 | Discharge: 2011-07-06 | DRG: 470 | Disposition: A | Discharge status: Skilled Nursing Facility | Payer: Medicare Other | Source: Ambulatory Visit | Attending: Orthopaedic Surgery | Admitting: Orthopaedic Surgery

## 2011-07-03 DIAGNOSIS — E039 Hypothyroidism, unspecified: Secondary | ICD-10-CM | POA: Diagnosis present

## 2011-07-03 DIAGNOSIS — M1990 Unspecified osteoarthritis, unspecified site: Secondary | ICD-10-CM | POA: Diagnosis present

## 2011-07-03 DIAGNOSIS — Z7901 Long term (current) use of anticoagulants: Secondary | ICD-10-CM

## 2011-07-03 DIAGNOSIS — J45909 Unspecified asthma, uncomplicated: Secondary | ICD-10-CM

## 2011-07-03 DIAGNOSIS — I10 Essential (primary) hypertension: Secondary | ICD-10-CM | POA: Diagnosis present

## 2011-07-03 DIAGNOSIS — J4489 Other specified chronic obstructive pulmonary disease: Secondary | ICD-10-CM | POA: Diagnosis present

## 2011-07-03 DIAGNOSIS — Z96659 Presence of unspecified artificial knee joint: Secondary | ICD-10-CM

## 2011-07-03 DIAGNOSIS — K219 Gastro-esophageal reflux disease without esophagitis: Secondary | ICD-10-CM | POA: Diagnosis present

## 2011-07-03 DIAGNOSIS — Z8249 Family history of ischemic heart disease and other diseases of the circulatory system: Secondary | ICD-10-CM

## 2011-07-03 DIAGNOSIS — K745 Biliary cirrhosis, unspecified: Secondary | ICD-10-CM

## 2011-07-03 DIAGNOSIS — Z79899 Other long term (current) drug therapy: Secondary | ICD-10-CM

## 2011-07-03 DIAGNOSIS — J8410 Pulmonary fibrosis, unspecified: Secondary | ICD-10-CM | POA: Diagnosis present

## 2011-07-03 DIAGNOSIS — D62 Acute posthemorrhagic anemia: Secondary | ICD-10-CM | POA: Diagnosis not present

## 2011-07-03 DIAGNOSIS — J449 Chronic obstructive pulmonary disease, unspecified: Secondary | ICD-10-CM | POA: Diagnosis present

## 2011-07-03 DIAGNOSIS — K7460 Unspecified cirrhosis of liver: Secondary | ICD-10-CM | POA: Diagnosis present

## 2011-07-03 DIAGNOSIS — M1710 Unilateral primary osteoarthritis, unspecified knee: Principal | ICD-10-CM | POA: Diagnosis present

## 2011-07-03 DIAGNOSIS — G43909 Migraine, unspecified, not intractable, without status migrainosus: Secondary | ICD-10-CM | POA: Diagnosis present

## 2011-07-03 DIAGNOSIS — E785 Hyperlipidemia, unspecified: Secondary | ICD-10-CM | POA: Diagnosis present

## 2011-07-03 DIAGNOSIS — Z833 Family history of diabetes mellitus: Secondary | ICD-10-CM

## 2011-07-03 DIAGNOSIS — Z87891 Personal history of nicotine dependence: Secondary | ICD-10-CM

## 2011-07-03 DIAGNOSIS — J84112 Idiopathic pulmonary fibrosis: Secondary | ICD-10-CM

## 2011-07-03 HISTORY — PX: TOTAL KNEE ARTHROPLASTY: SHX125

## 2011-07-03 LAB — CBC
HCT: 31.7 % — ABNORMAL LOW (ref 36.0–46.0)
MCH: 29.5 pg (ref 26.0–34.0)
MCV: 90.1 fL (ref 78.0–100.0)
Platelets: 190 10*3/uL (ref 150–400)
RBC: 3.52 MIL/uL — ABNORMAL LOW (ref 3.87–5.11)

## 2011-07-03 LAB — COMPREHENSIVE METABOLIC PANEL
AST: 25 U/L (ref 0–37)
BUN: 29 mg/dL — ABNORMAL HIGH (ref 6–23)
CO2: 25 mEq/L (ref 19–32)
Calcium: 8.8 mg/dL (ref 8.4–10.5)
Creatinine, Ser: 1.18 mg/dL — ABNORMAL HIGH (ref 0.50–1.10)
GFR calc Af Amer: 48 mL/min — ABNORMAL LOW (ref >90)
GFR calc non Af Amer: 42 mL/min — ABNORMAL LOW (ref >90)
Glucose, Bld: 151 mg/dL — ABNORMAL HIGH (ref 70–99)

## 2011-07-03 SURGERY — ARTHROPLASTY, KNEE, TOTAL
Anesthesia: General | Site: Knee | Laterality: Right | Wound class: Clean

## 2011-07-03 MED ORDER — DOCUSATE SODIUM 100 MG PO CAPS
100.0000 mg | ORAL_CAPSULE | Freq: Two times a day (BID) | ORAL | Status: DC
  Administered 2011-07-03 – 2011-07-06 (×6): 100 mg via ORAL
  Filled 2011-07-03 (×7): qty 1

## 2011-07-03 MED ORDER — TIOTROPIUM BROMIDE MONOHYDRATE 18 MCG IN CAPS
18.0000 ug | ORAL_CAPSULE | Freq: Every day | RESPIRATORY_TRACT | Status: DC
  Administered 2011-07-03 – 2011-07-05 (×3): 18 ug via RESPIRATORY_TRACT
  Filled 2011-07-03: qty 5

## 2011-07-03 MED ORDER — CEFAZOLIN SODIUM 1-5 GM-% IV SOLN
1.0000 g | Freq: Four times a day (QID) | INTRAVENOUS | Status: AC
  Administered 2011-07-03 (×2): 1 g via INTRAVENOUS
  Filled 2011-07-03 (×2): qty 50

## 2011-07-03 MED ORDER — LIDOCAINE HCL (CARDIAC) 20 MG/ML IV SOLN
INTRAVENOUS | Status: DC | PRN
  Administered 2011-07-03: 80 mg via INTRAVENOUS

## 2011-07-03 MED ORDER — TRIAMTERENE-HCTZ 37.5-25 MG PO CAPS
1.0000 | ORAL_CAPSULE | Freq: Every day | ORAL | Status: DC
  Administered 2011-07-04 – 2011-07-06 (×3): 1 via ORAL
  Filled 2011-07-03 (×3): qty 1

## 2011-07-03 MED ORDER — IRBESARTAN 150 MG PO TABS
150.0000 mg | ORAL_TABLET | Freq: Every day | ORAL | Status: DC
  Filled 2011-07-03: qty 1

## 2011-07-03 MED ORDER — OXYCODONE HCL 5 MG PO TABS
5.0000 mg | ORAL_TABLET | ORAL | Status: DC | PRN
  Administered 2011-07-03 – 2011-07-04 (×6): 10 mg via ORAL
  Administered 2011-07-05: 5 mg via ORAL
  Administered 2011-07-05 – 2011-07-06 (×2): 10 mg via ORAL
  Filled 2011-07-03 (×8): qty 2

## 2011-07-03 MED ORDER — KETOROLAC TROMETHAMINE 30 MG/ML IJ SOLN
INTRAMUSCULAR | Status: AC
  Administered 2011-07-03: 15 mg
  Filled 2011-07-03: qty 1

## 2011-07-03 MED ORDER — METOCLOPRAMIDE HCL 10 MG PO TABS: 5.0000 mg | ORAL_TABLET | Freq: Three times a day (TID) | ORAL | Status: DC | PRN

## 2011-07-03 MED ORDER — ONDANSETRON HCL 4 MG/2ML IJ SOLN
INTRAMUSCULAR | Status: DC | PRN
  Administered 2011-07-03: 4 mg via INTRAVENOUS

## 2011-07-03 MED ORDER — KETOROLAC TROMETHAMINE 15 MG/ML IJ SOLN: 15.0000 mg | Freq: Four times a day (QID) | INTRAMUSCULAR | Status: DC

## 2011-07-03 MED ORDER — SODIUM CHLORIDE 0.9 % IR SOLN
Status: DC | PRN
  Administered 2011-07-03: 3000 mL

## 2011-07-03 MED ORDER — RIVAROXABAN 10 MG PO TABS
10.0000 mg | ORAL_TABLET | ORAL | Status: DC
  Administered 2011-07-03 – 2011-07-05 (×3): 10 mg via ORAL
  Filled 2011-07-03 (×4): qty 1

## 2011-07-03 MED ORDER — SODIUM CHLORIDE 0.9 % IV SOLN
75.0000 mL/h | INTRAVENOUS | Status: DC
  Administered 2011-07-04: 75 mL/h via INTRAVENOUS

## 2011-07-03 MED ORDER — HYDROMORPHONE HCL PF 1 MG/ML IJ SOLN: 0.5000 mg | INTRAMUSCULAR | Status: DC | PRN

## 2011-07-03 MED ORDER — ACETAMINOPHEN 10 MG/ML IV SOLN
INTRAVENOUS | Status: AC
  Filled 2011-07-03: qty 100

## 2011-07-03 MED ORDER — HYDROMORPHONE HCL PF 1 MG/ML IJ SOLN
0.2500 mg | INTRAMUSCULAR | Status: DC | PRN
Start: 2011-07-03 — End: 2011-07-03
  Administered 2011-07-03 (×2): 0.5 mg via INTRAVENOUS

## 2011-07-03 MED ORDER — ACETAMINOPHEN 10 MG/ML IV SOLN
1000.0000 mg | Freq: Four times a day (QID) | INTRAVENOUS | Status: AC
  Administered 2011-07-03 – 2011-07-04 (×3): 1000 mg via INTRAVENOUS
  Filled 2011-07-03 (×5): qty 100

## 2011-07-03 MED ORDER — LACTATED RINGERS IV SOLN
INTRAVENOUS | Status: DC | PRN
  Administered 2011-07-03 (×2): via INTRAVENOUS

## 2011-07-03 MED ORDER — PHENYLEPHRINE HCL 10 MG/ML IJ SOLN
INTRAMUSCULAR | Status: DC | PRN
  Administered 2011-07-03 (×3): 80 ug via INTRAVENOUS

## 2011-07-03 MED ORDER — MEDROXYPROGESTERONE ACETATE 2.5 MG PO TABS
2.5000 mg | ORAL_TABLET | Freq: Every day | ORAL | Status: DC
  Administered 2011-07-03 – 2011-07-05 (×3): 2.5 mg via ORAL
  Filled 2011-07-03 (×4): qty 1

## 2011-07-03 MED ORDER — CHLORHEXIDINE GLUCONATE 4 % EX LIQD
60.0000 mL | Freq: Once | CUTANEOUS | Status: DC
  Filled 2011-07-03: qty 60

## 2011-07-03 MED ORDER — PANTOPRAZOLE SODIUM 40 MG PO TBEC
40.0000 mg | DELAYED_RELEASE_TABLET | Freq: Every day | ORAL | Status: DC
  Administered 2011-07-03 – 2011-07-06 (×3): 40 mg via ORAL
  Filled 2011-07-03 (×3): qty 1

## 2011-07-03 MED ORDER — METHOCARBAMOL 500 MG PO TABS
500.0000 mg | ORAL_TABLET | Freq: Four times a day (QID) | ORAL | Status: DC | PRN
  Administered 2011-07-03 – 2011-07-06 (×4): 500 mg via ORAL
  Filled 2011-07-03 (×3): qty 1

## 2011-07-03 MED ORDER — FENTANYL CITRATE 0.05 MG/ML IJ SOLN
INTRAMUSCULAR | Status: DC | PRN
  Administered 2011-07-03 (×2): 100 ug via INTRAVENOUS
  Administered 2011-07-03: 50 ug via INTRAVENOUS

## 2011-07-03 MED ORDER — PHENOL 1.4 % MT LIQD: 1.0000 | OROMUCOSAL | Status: DC | PRN

## 2011-07-03 MED ORDER — CHLORHEXIDINE GLUCONATE 4 % EX LIQD
60.0000 mL | Freq: Every day | CUTANEOUS | Status: DC
  Filled 2011-07-03: qty 60

## 2011-07-03 MED ORDER — ESTRADIOL 1 MG PO TABS
0.5000 mg | ORAL_TABLET | Freq: Every day | ORAL | Status: DC
Start: 2011-07-03 — End: 2011-07-03

## 2011-07-03 MED ORDER — AMLODIPINE BESYLATE 5 MG PO TABS
5.0000 mg | ORAL_TABLET | Freq: Every day | ORAL | Status: DC
  Filled 2011-07-03: qty 1

## 2011-07-03 MED ORDER — PROPOFOL 10 MG/ML IV EMUL
INTRAVENOUS | Status: DC | PRN
  Administered 2011-07-03: 170 mg via INTRAVENOUS
  Administered 2011-07-03: 30 mg via INTRAVENOUS

## 2011-07-03 MED ORDER — METOCLOPRAMIDE HCL 5 MG/ML IJ SOLN: 5.0000 mg | Freq: Three times a day (TID) | INTRAMUSCULAR | Status: DC | PRN

## 2011-07-03 MED ORDER — DEXTROSE 5 % IV SOLN
INTRAVENOUS | Status: DC | PRN
  Administered 2011-07-03: 08:00:00 via INTRAVENOUS

## 2011-07-03 MED ORDER — BUPIVACAINE-EPINEPHRINE PF 0.25-1:200000 % IJ SOLN
INTRAMUSCULAR | Status: AC
  Filled 2011-07-03: qty 30

## 2011-07-03 MED ORDER — ALBUTEROL SULFATE HFA 108 (90 BASE) MCG/ACT IN AERS: 2.0000 | INHALATION_SPRAY | Freq: Four times a day (QID) | RESPIRATORY_TRACT | Status: DC | PRN

## 2011-07-03 MED ORDER — AMLODIPINE-OLMESARTAN 5-20 MG PO TABS: 1.0000 | ORAL_TABLET | Freq: Every day | ORAL | Status: DC

## 2011-07-03 MED ORDER — METHOCARBAMOL 500 MG PO TABS
ORAL_TABLET | ORAL | Status: AC
  Filled 2011-07-03: qty 1

## 2011-07-03 MED ORDER — HYDROMORPHONE HCL PF 1 MG/ML IJ SOLN
INTRAMUSCULAR | Status: AC
  Filled 2011-07-03: qty 1

## 2011-07-03 MED ORDER — MENTHOL 3 MG MT LOZG: 1.0000 | LOZENGE | OROMUCOSAL | Status: DC | PRN

## 2011-07-03 MED ORDER — POLYETHYL GLYCOL-PROPYL GLYCOL 0.4-0.3 % OP SOLN: 2.0000 [drp] | Freq: Three times a day (TID) | OPHTHALMIC | Status: DC | PRN

## 2011-07-03 MED ORDER — KETOROLAC TROMETHAMINE 15 MG/ML IJ SOLN
15.0000 mg | Freq: Four times a day (QID) | INTRAMUSCULAR | Status: AC
  Administered 2011-07-03: 15 mg via INTRAVENOUS
  Filled 2011-07-03 (×2): qty 1

## 2011-07-03 MED ORDER — BUPIVACAINE-EPINEPHRINE 0.25% -1:200000 IJ SOLN
INTRAMUSCULAR | Status: DC | PRN
  Administered 2011-07-03: 30 mL

## 2011-07-03 MED ORDER — OXYCODONE HCL 5 MG PO TABS
ORAL_TABLET | ORAL | Status: AC
  Filled 2011-07-03: qty 2

## 2011-07-03 MED ORDER — CEFAZOLIN SODIUM 1-5 GM-% IV SOLN
INTRAVENOUS | Status: AC
  Filled 2011-07-03: qty 50

## 2011-07-03 MED ORDER — MIDAZOLAM HCL 5 MG/5ML IJ SOLN
INTRAMUSCULAR | Status: DC | PRN
  Administered 2011-07-03: .5 mg via INTRAVENOUS

## 2011-07-03 MED ORDER — ONDANSETRON HCL 4 MG PO TABS: 4.0000 mg | ORAL_TABLET | Freq: Four times a day (QID) | ORAL | Status: DC | PRN

## 2011-07-03 MED ORDER — ESTRADIOL 1 MG PO TABS
0.5000 mg | ORAL_TABLET | Freq: Every day | ORAL | Status: DC
  Administered 2011-07-03 – 2011-07-05 (×3): 0.5 mg via ORAL
  Filled 2011-07-03 (×4): qty 0.5

## 2011-07-03 MED ORDER — LEVOTHYROXINE SODIUM 112 MCG PO TABS
112.0000 ug | ORAL_TABLET | Freq: Every day | ORAL | Status: DC
  Administered 2011-07-04 – 2011-07-06 (×3): 112 ug via ORAL
  Filled 2011-07-03 (×4): qty 1

## 2011-07-03 MED ORDER — 0.9 % SODIUM CHLORIDE (POUR BTL) OPTIME
TOPICAL | Status: DC | PRN
  Administered 2011-07-03: 1000 mL

## 2011-07-03 MED ORDER — METHOCARBAMOL 100 MG/ML IJ SOLN
500.0000 mg | Freq: Four times a day (QID) | INTRAVENOUS | Status: DC | PRN
  Filled 2011-07-03: qty 5

## 2011-07-03 MED ORDER — ONDANSETRON HCL 4 MG/2ML IJ SOLN: 4.0000 mg | Freq: Four times a day (QID) | INTRAMUSCULAR | Status: DC | PRN

## 2011-07-03 MED ORDER — SODIUM CHLORIDE 0.9 % IV SOLN
INTRAVENOUS | Status: DC
  Administered 2011-07-03: 12:00:00 via INTRAVENOUS

## 2011-07-03 MED ORDER — POLYVINYL ALCOHOL 1.4 % OP SOLN
2.0000 [drp] | Freq: Three times a day (TID) | OPHTHALMIC | Status: DC | PRN
  Filled 2011-07-03: qty 15

## 2011-07-03 SURGICAL SUPPLY — 65 items
BANDAGE ESMARK 6X9 LF (GAUZE/BANDAGES/DRESSINGS) ×1 IMPLANT
BLADE SAGITTAL 25.0X1.19X90 (BLADE) ×2 IMPLANT
BNDG ESMARK 6X9 LF (GAUZE/BANDAGES/DRESSINGS) ×2
BOWL SMART MIX CTS (DISPOSABLE) ×2 IMPLANT
CEMENT HV SMART SET (Cement) ×4 IMPLANT
CLOTH BEACON ORANGE TIMEOUT ST (SAFETY) ×2 IMPLANT
COVER BACK TABLE 24X17X13 BIG (DRAPES) IMPLANT
COVER SURGICAL LIGHT HANDLE (MISCELLANEOUS) ×2 IMPLANT
CUFF TOURNIQUET SINGLE 34IN LL (TOURNIQUET CUFF) ×2 IMPLANT
CUFF TOURNIQUET SINGLE 44IN (TOURNIQUET CUFF) IMPLANT
DRAPE EXTREMITY T 121X128X90 (DRAPE) ×2 IMPLANT
DRAPE PROXIMA HALF (DRAPES) ×2 IMPLANT
DRSG ADAPTIC 3X8 NADH LF (GAUZE/BANDAGES/DRESSINGS) ×2 IMPLANT
DRSG PAD ABDOMINAL 8X10 ST (GAUZE/BANDAGES/DRESSINGS) ×2 IMPLANT
DURAPREP 26ML APPLICATOR (WOUND CARE) ×2 IMPLANT
ELECT CAUTERY BLADE 6.4 (BLADE) ×2 IMPLANT
ELECT REM PT RETURN 9FT ADLT (ELECTROSURGICAL) ×2
ELECTRODE REM PT RTRN 9FT ADLT (ELECTROSURGICAL) ×1 IMPLANT
EVACUATOR 1/8 PVC DRAIN (DRAIN) ×2 IMPLANT
FACESHIELD LNG OPTICON STERILE (SAFETY) ×4 IMPLANT
FLOSEAL 10ML (HEMOSTASIS) IMPLANT
GLOVE BIOGEL PI IND STRL 7.0 (GLOVE) ×3 IMPLANT
GLOVE BIOGEL PI IND STRL 7.5 (GLOVE) ×1 IMPLANT
GLOVE BIOGEL PI IND STRL 8 (GLOVE) ×1 IMPLANT
GLOVE BIOGEL PI IND STRL 8.5 (GLOVE) ×2 IMPLANT
GLOVE BIOGEL PI INDICATOR 7.0 (GLOVE) ×3
GLOVE BIOGEL PI INDICATOR 7.5 (GLOVE) ×1
GLOVE BIOGEL PI INDICATOR 8 (GLOVE) ×1
GLOVE BIOGEL PI INDICATOR 8.5 (GLOVE) ×2
GLOVE ECLIPSE 8.0 STRL XLNG CF (GLOVE) ×6 IMPLANT
GLOVE SURG ORTHO 8.5 STRL (GLOVE) ×6 IMPLANT
GLOVE SURG SS PI 6.5 STRL IVOR (GLOVE) ×2 IMPLANT
GOWN PREVENTION PLUS XLARGE (GOWN DISPOSABLE) IMPLANT
GOWN STRL NON-REIN LRG LVL3 (GOWN DISPOSABLE) ×6 IMPLANT
GOWN STRL REIN 3XL LVL4 (GOWN DISPOSABLE) ×2 IMPLANT
HANDPIECE INTERPULSE COAX TIP (DISPOSABLE) ×1
KIT BASIN OR (CUSTOM PROCEDURE TRAY) ×2 IMPLANT
KIT ROOM TURNOVER OR (KITS) ×2 IMPLANT
MANIFOLD NEPTUNE II (INSTRUMENTS) ×2 IMPLANT
MARKER SPHERE PSV REFLC THRD 5 (MARKER) IMPLANT
NEEDLE 22X1 1/2 (OR ONLY) (NEEDLE) ×2 IMPLANT
NS IRRIG 1000ML POUR BTL (IV SOLUTION) ×2 IMPLANT
PACK TOTAL JOINT (CUSTOM PROCEDURE TRAY) ×2 IMPLANT
PAD ARMBOARD 7.5X6 YLW CONV (MISCELLANEOUS) ×2 IMPLANT
PAD CAST 4YDX4 CTTN HI CHSV (CAST SUPPLIES) ×1 IMPLANT
PADDING CAST COTTON 4X4 STRL (CAST SUPPLIES) ×1
PADDING CAST COTTON 6X4 STRL (CAST SUPPLIES) ×2 IMPLANT
PIN SCHANZ 4MM 130MM (PIN) IMPLANT
SET HNDPC FAN SPRY TIP SCT (DISPOSABLE) ×1 IMPLANT
SPONGE GAUZE 4X4 12PLY (GAUZE/BANDAGES/DRESSINGS) ×2 IMPLANT
STAPLER VISISTAT 35W (STAPLE) ×2 IMPLANT
SUCTION FRAZIER TIP 10 FR DISP (SUCTIONS) ×2 IMPLANT
SUT BONE WAX W31G (SUTURE) ×2 IMPLANT
SUT ETHIBOND NAB CT1 #1 30IN (SUTURE) ×6 IMPLANT
SUT MNCRL AB 3-0 PS2 18 (SUTURE) ×4 IMPLANT
SUT VIC AB 0 CT1 27 (SUTURE) ×2
SUT VIC AB 0 CT1 27XBRD ANBCTR (SUTURE) ×2 IMPLANT
SUT VIC AB 1 CT1 27 (SUTURE)
SUT VIC AB 1 CT1 27XBRD ANBCTR (SUTURE) IMPLANT
SYR CONTROL 10ML LL (SYRINGE) ×2 IMPLANT
TOWEL OR 17X24 6PK STRL BLUE (TOWEL DISPOSABLE) ×2 IMPLANT
TOWEL OR 17X26 10 PK STRL BLUE (TOWEL DISPOSABLE) ×2 IMPLANT
TRAY FOLEY CATH 14FR (SET/KITS/TRAYS/PACK) ×2 IMPLANT
WATER STERILE IRR 1000ML POUR (IV SOLUTION) ×2 IMPLANT
WRAP KNEE MAXI GEL POST OP (GAUZE/BANDAGES/DRESSINGS) ×2 IMPLANT

## 2011-07-03 NOTE — Anesthesia Preprocedure Evaluation (Addendum)
Anesthesia Evaluation  Patient identified by MRN, date of birth, ID band Patient awake    Reviewed: Allergy & Precautions, H&P , NPO status , Patient's Chart, lab work & pertinent test results  Airway Mallampati: II      Dental  (+) Teeth Intact and Dental Advisory Given   Pulmonary shortness of breath and with exertion, asthma , pneumonia , COPD COPD inhaler,  breath sounds clear to auscultation  Pulmonary exam normal       Cardiovascular Exercise Tolerance: Poor hypertension, Pt. on medications Rhythm:Regular Rate:Normal     Neuro/Psych  Headaches,    GI/Hepatic GERD-  Medicated and Controlled,(+) Cirrhosis -       ,   Endo/Other  negative endocrine ROS  Renal/GU Renal InsufficiencyRenal diseasenegative Renal ROS     Musculoskeletal  (+) Arthritis -, Osteoarthritis,    Abdominal   Peds  Hematology negative hematology ROS (+)   Anesthesia Other Findings   Reproductive/Obstetrics                        Anesthesia Physical Anesthesia Plan  ASA: III  Anesthesia Plan: General   Post-op Pain Management:    Induction: Intravenous  Airway Management Planned: Oral ETT  Additional Equipment:   Intra-op Plan:   Post-operative Plan: Extubation in OR  Informed Consent:   Dental advisory given  Plan Discussed with: CRNA  Anesthesia Plan Comments:        Anesthesia Quick Evaluation

## 2011-07-03 NOTE — Progress Notes (Signed)
CARE MANAGEMENT NOTE 07/03/2011  Patient:  [name], [name]   Account Number:  [id number]  Date Initiated:  07/03/2011  Documentation initiated by:  Vance Peper  Subjective/Objective Assessment:   78 yr old female s/p right total knee arthroplasty     Action/Plan:   Patient will go to SNF for shortterm rehab, Social Worker aware.   Anticipated DC Date:  07/06/2011   Anticipated DC Plan:  SKILLED NURSING FACILITY  In-house referral  Clinical Social Worker      DC Planning Services  CM consult      [facility] Choice  NA   Choice offered to / List presented to:             Status of service:  Completed, signed off Discharge Disposition:  SKILLED NURSING FACILITY

## 2011-07-03 NOTE — Anesthesia Procedure Notes (Addendum)
Procedure Name: LMA Insertion Date/Time: 07/03/2011 8:10 AM Performed by: Tyrone Nine Pre-anesthesia Checklist: Patient identified, Emergency Drugs available, Suction available, Patient being monitored and Timeout performed Patient Re-evaluated:Patient Re-evaluated prior to inductionPreoxygenation: Pre-oxygenation with 100% oxygen Intubation Type: IV induction Ventilation: Mask ventilation without difficulty LMA: LMA with gastric port inserted LMA Size: 4.0 Number of attempts: 1 Placement Confirmation: CO2 detector,  positive ETCO2,  ETT inserted through vocal cords under direct vision and breath sounds checked- equal and bilateral Tube secured with: Tape Dental Injury: Teeth and Oropharynx as per pre-operative assessment    Anesthesia Regional Block:  Femoral nerve block  Pre-Anesthetic Checklist: ,, timeout performed, Correct Patient, Correct Site, Correct Laterality, Correct Procedure, Correct Position, site marked, Risks and benefits discussed,  Surgical consent,  Pre-op evaluation,  At surgeon's request and post-op pain management  Laterality: Right  Prep: Maximum Sterile Barrier Precautions used and chloraprep       Needles:  Injection technique: Single-shot  Needle Type: Echogenic Needle          Additional Needles:  Procedures: Doppler guided and nerve stimulator Femoral nerve block  Nerve Stimulator or Paresthesia:  Response: 0.5 mA,   Additional Responses:   Narrative:  Start time: 07/03/2011 7:30 AM End time: 07/03/2011 7:45 AM Injection made incrementally with aspirations every 5 mL.  Performed by: Personally  Anesthesiologist: Dr. Randa Evens    A functioning IV was confirmed and monitors were applied.  Sterile prep and drape, hand hygiene and sterile gloves were used.  Negative aspiration and test dose prior to incremental administration of local anesthetic. The patient tolerated the procedure well. .ce

## 2011-07-03 NOTE — Progress Notes (Signed)
UR COMPLETED  

## 2011-07-03 NOTE — Progress Notes (Signed)
CPM begun on right knee 0-60 degrees.

## 2011-07-03 NOTE — Consult Note (Signed)
[name] is an 78 y.o. female.    PCP: Michiel Sites, MD    Reason for Consult: Management of hypertension Referring Physician: Dr. Cleophas Dunker   Chief Complaint: My knee hurts  HPI: This is a 78 year old, Caucasian female, with past medical history of hypertension, biliary cirrhosis, osteoarthritis, asthma, who underwent a right total knee replacement earlier today. We were asked to see the patient for management of her medical problems. Patient is currently lying comfortably on the bed. She denies any pain. Denies any chest pain or shortness of breath. No nausea, vomiting. Denies abdominal pain. The surgery was uneventful. Her daughter is at the bedside   Prior to Admission medications   Medication Sig Start Date End Date Taking? Authorizing Provider  albuterol (PROVENTIL HFA;VENTOLIN HFA) 108 (90 BASE) MCG/ACT inhaler Inhale 2 puffs into the lungs every 6 (six) hours as needed. For shortness of breath   Yes Historical Provider, MD  amLODipine-olmesartan (AZOR) 5-20 MG per tablet Take 1 tablet by mouth daily.   Yes Historical Provider, MD  aspirin EC 81 MG tablet Take 81 mg by mouth daily.   Yes Historical Provider, MD  Calcium 600-200 MG-UNIT per tablet Take 1 tablet by mouth daily.   Yes Historical Provider, MD  Cholecalciferol (VITAMIN D-3 PO) Take 1 tablet by mouth daily.   Yes Historical Provider, MD  estradiol (ESTRACE) 0.5 MG tablet Take 0.5 mg by mouth at bedtime.   Yes Historical Provider, MD  Glucos-Chond-Sterol-Fish Oil (GLUCOSAMINE CHONDROITIN PLUS PO) Take 1 tablet by mouth daily.   Yes Historical Provider, MD  Ibuprofen-Diphenhydramine Cit (MOTRIN PM PO) Take 2 tablets by mouth at bedtime.   Yes Historical Provider, MD  levothyroxine (SYNTHROID, LEVOTHROID) 112 MCG tablet Take 112 mcg by mouth daily.   Yes Historical Provider, MD  magnesium oxide (MAG-OX) 400 MG tablet Take 400 mg by mouth daily.   Yes Historical Provider, MD  medroxyPROGESTERone (PROVERA)  2.5 MG tablet Take 2.5 mg by mouth daily.   Yes Historical Provider, MD  meloxicam (MOBIC) 15 MG tablet Take 15 mg by mouth daily.   Yes Historical Provider, MD  Naproxen Sodium (ALEVE) 220 MG CAPS Take 440 mg by mouth every morning.   Yes Historical Provider, MD  Omega-3 Fatty Acids (FISH OIL) 1000 MG CAPS Take 1 capsule by mouth daily.   Yes Historical Provider, MD  omeprazole (PRILOSEC) 20 MG capsule Take 20 mg by mouth as needed. For heart burn   Yes Historical Provider, MD  Polyethyl Glycol-Propyl Glycol (SYSTANE OP) Place 1 drop into both eyes 3 (three) times daily as needed. For dry eyes   Yes Historical Provider, MD  tiotropium (SPIRIVA) 18 MCG inhalation capsule Place 18 mcg into inhaler and inhale daily.   Yes Historical Provider, MD  traMADol (ULTRAM) 50 MG tablet Take 50 mg by mouth daily.   Yes Historical Provider, MD  triamterene-hydrochlorothiazide (DYAZIDE) 37.5-25 MG per capsule Take 1 capsule by mouth daily.   Yes Historical Provider, MD  vitamin E 400 UNIT capsule Take 800 Units by mouth daily.   Yes Historical Provider, MD    Allergies: No Known Allergies  Past Medical History  Diagnosis Date  . COPD (chronic obstructive pulmonary disease)   . Hyperlipemia   . Heart murmur   . Shortness of breath     with exertion  . Asthma   . Pneumonia     hx of frequent episodes of pneumonia  . Headache     MIgraine- Visualchanges-  flashing lights and cant focus and cant see.  Marland Kitchen GERD (gastroesophageal reflux disease)   . Arthritis   . Diverticulitis 2008  . Pulmonary fibrosis   . Hypertension   . Cirrhosis of liver not due to alcohol     Past Surgical History  Procedure Date  . Eye surgery 05/2008    - bilateral  . Back surgery   . Total knee arthroplasty 11/2008  . Rotator cuff repair 2008    right  . Retinal tear repair cryotherapy   . Tonsillectomy   . Appendectomy   . Dilation and curettage of uterus     Social History:  reports that she has quit smoking. She  does not have any smokeless tobacco history on file. She reports that she does not drink alcohol or use illicit drugs.  Family History:  Family History  Problem Relation Age of Onset  . Multiple sclerosis Sister   . Diabetes Sister   . Heart disease Sister     Review of Systems - History obtained from the patient General ROS: negative Psychological ROS: negative Ophthalmic ROS: negative ENT ROS: negative Allergy and Immunology ROS: negative Hematological and Lymphatic ROS: negative Endocrine ROS: negative Respiratory ROS: no cough, shortness of breath, or wheezing Cardiovascular ROS: no chest pain or dyspnea on exertion Gastrointestinal ROS: no abdominal pain, change in bowel habits, or black or bloody stools Genito-Urinary ROS: no dysuria, trouble voiding, or hematuria Musculoskeletal ROS: negative Neurological ROS: no TIA or stroke symptoms Dermatological ROS: negative  Physical Examination Blood pressure 126/55, pulse 80, temperature 97.9 F (36.6 C), temperature source Oral, resp. rate 13, weight 69.5 kg (153 lb 3.5 oz), SpO2 100.00%.  General appearance: alert, cooperative, appears stated age and no distress Head: Normocephalic, without obvious abnormality, atraumatic Eyes: conjunctivae/corneas clear. PERRL, EOM's intact. Fundi benign. Throat: lips, mucosa, and tongue normal; teeth and gums normal Neck: no adenopathy, no carotid bruit, no JVD, supple, symmetrical, trachea midline and thyroid not enlarged, symmetric, no tenderness/mass/nodules Resp: few crackles heard bilaterally. Cardio: regular rate and rhythm, S1, S2 normal, no murmur, click, rub or gallop GI: soft, non-tender; bowel sounds normal; no masses,  no organomegaly Extremities: extremities normal, atraumatic, no cyanosis or edema Lymph nodes: Cervical, supraclavicular, and axillary nodes normal. Neurologic: Limited exam but no focal deficits.  Labs: None so far  Assessment/Plan  Active Problems:  HTN  (hypertension)  Hypothyroidism  Pulmonary fibrosis  OA (osteoarthritis)  Asthma  Non-alcoholic cirrhosis   So, this is a 78 year old, Caucasian female, with medical problems as stated above who is status post right knee replacement. She appears to be medically stable at this time. Preoperative labs from a few days ago, showed that her potassium level was mildly elevated. She appears to have chronic kidney disease. We will repeat her labs during this hospitalization.  #1 history of hypertension: Monitor blood pressure closely. May resume her home medications depending on renal function.  #2 history of hypothyroidism. Continue with levothyroxine.  #3 history of nonalcoholic cirrhosis, probably biliary: We will check a liver function test.  #4 history of asthma. Continue with Spiriva nebulizers as needed. Nebulizer treatments will be utilized as needed  #5 pulmonary fibrosis seen on chest x-ray: This is probably causing the crackles in the lung. Patient does not appear to have shortness of breath. Continue to monitor closely. She did have a pulomnary function test prior to her surgery.  #6 status post right total knee replacement: Patient appears to be stable at this time. She was  cleared by [facility]. Heart and vascular cardiology service prior to surgery. She underwent an echocardiogram, which showed normal systolic function with mild diastolic dysfunction. Please be cautious with IV fluids.  #7 DVT, prophylaxis per orthopedic,  She's a full code.  We would like to thank Dr. Cleophas Dunker for this consultation. We will follow this patient along with him while she is hospitalized.  [facility]  [facility] Pager [phone]  07/03/2011, 1:10 PM

## 2011-07-03 NOTE — Op Note (Signed)
[name], [name] NO.:  [id number]  MEDICAL RECORD NO.:  [id number]  LOCATION:  [facility]                         FACILITY:  [facility]  PHYSICIAN:  [name], M.D.DATE OF BIRTH:  1927/11/17  DATE OF PROCEDURE:  07/03/2011 DATE OF DISCHARGE:                              OPERATIVE REPORT   PREOPERATIVE DIAGNOSIS:  End-stage osteoarthritis, right knee.  POSTOPERATIVE DIAGNOSIS:  End-stage osteoarthritis, right knee.  PROCEDURE:  Right total knee replacement.  SURGEON:  Diana Manges. Cleophas Dunker, MD  ASSISTANT:  Diana Drone. Petrarca, PA-C  ANESTHESIA:  General laryngeal with supplemental femoral nerve block.  COMPLICATIONS:  None.  COMPONENTS:  DePuy LCS standard femoral component.  A #3 keeled rotating tray, a 12.5-mm polyethylene bridging bearing, a metal-back 3-peg rotating patella, all was secured with polymethyl methacrylate.  PROCEDURE:  [name] was met in the holding area, identified the right knee as the appropriate operative site.  She did receive a preoperative femoral nerve block.  The patient was then transported to room #7 and placed under general anesthesia without difficulty.  Nursing staff inserted a Foley catheter.  Urine was clear.  Tourniquet was then applied to the right thigh.  The right lower extremity was prepped with Betadine scrub and DuraPrep from the tourniquet to the midfoot.  Sterile draping was performed.  The extremity was elevated with Esmarch, exsanguinated with a proximal tourniquet at 350 mmHg.  A midline longitudinal incision was made, centered about the patella, extending from the superior pouch to tibial tubercle.  Via sharp dissection, incision was down to the subcutaneous tissue.  First layer of capsule was incised in midline and medial parapatellar incision was made with the Bovie.  The joint was then entered.  There was a clear yellow joint effusion of approximately 25 mL.  Patella was everted at 180 degrees  laterally, the knee flexed at 90 degrees.  There were large osteophytes along the medial and lateral femoral condyles, complete absence of articular cartilage on the medial femoral condyle.  There was a moderate amount of beefy-red synovitis.  A synovectomy was performed.  We measured the standard femoral component.  The external tibial guide was then applied and first bony cut was made transversely in the proximal tibia with a 7-degree angle of declination. We checked our alignment, felt that it was perfect.  Subsequent cuts were then made on the femur using the standard guide and 4-degree distal femoral valgus cut.  A 12.5-mm flexion and extension gaps were perfectly symmetrical pop.  Lamina spreaders were inserted to remove medial and menisci as well as ACL and PCL.  Osteophytes removed from the medial and lateral femoral condyles with a 3/4-inch curved osteotome.  There was a large Baker cyst identified posterior medially, this was debrided.  At each bony cut, we checked our alignment and again felt that it was perfectly aligned.  The final cut was then made for tapering on the femur and to make the center holes in the distal femoral condyles.  Retractors were placed about the tibia, was advanced anteriorly.  We measured a #3 tibial tray.  Center cut was made followed by the keeled cut.  With the tibial jig in  place, we used a 12.5-mm bridging bearing trial and then applied the standard femoral component.  With the knee reduced, we had full extension, no opening with varus or valgus stress and we had very nice flexion with no malrotation of the tibial tray.  Patella was prepared by removing 10 mm of patellar thickness leaving about 12.5 of 13 mm patellar thickness. Three holes were then made with the trial jig.  The trial patella was inserted through a full range of motion, it was remained perfectly stable.  The trial components were removed.  The joint was irrigated  with saline solution.  The final components were then impacted with polymethyl methacrylate.  We initially inserted the tibial tray followed by the polyethylene bearing and the standard femoral component.  With the knee in extension, we checked for any peripheral extruded methacrylate, which was removed.  The patella was applied with patellar clamp with methacrylate.  Approximately 16 minutes, the methacrylate had matured. We checked the joint and there was no further extraneous methacrylate. The joint was infiltrated with 0.25% Marcaine with epinephrine, totalling 30 mL.  The tourniquet was deflated at 56 minutes.  We had immediate bleeding, which was controlled with the Bovie.  Medium-size Hemovac was placed in the lateral compartment.  The deep capsule was then closed with interrupted #1 Ethibond, superficial capsule was closed with running 0 Vicryl.  The subcu was closed with 2-0 Vicryl and 3-0 Monocryl.  Skin was closed with skin clips.  Sterile bulky dressing was applied followed by the patient's support stocking.  The patient tolerated the procedure without complications.     [name], M.D.     PWW/MEDQ  D:  07/03/2011  T:  07/03/2011  Job:  [id number]

## 2011-07-03 NOTE — Preoperative (Signed)
Beta Blockers   Reason not to administer Beta Blockers:Not Applicable 

## 2011-07-03 NOTE — Brief Op Note (Signed)
07/03/2011  9:38 AM  PATIENT:  [name]  78 y.o. female  PRE-OPERATIVE DIAGNOSIS:  osteoarthritis right knee-end stage  POST-OPERATIVE DIAGNOSIS:  osteoarthritis right knee  PROCEDURE:  Procedure(s) (LRB): TOTAL KNEE ARTHROPLASTY (Right)  SURGEON:  Surgeon(s) and Role:    * Valeria Batman, MD - Primary  PHYSICIAN ASSISTANT: Jacqualine Code, [facility]  ANESTHESIA: general with femoral nerve block  BLOOD ADMINISTERED:none  DRAINS: (right knee) Hemovact drain(s) in the open with  Suction Open   LOCAL MEDICATIONS USED:  MARCAINE     SPECIMEN:  No Specimen  DISPOSITION OF SPECIMEN:  N/A  COUNTS:  YES  TOURNIQUET:   Total Tourniquet Time Documented: Thigh (Right) - 56 minutes  DICTATION: .Other Dictation: Dictation Number [id number]  PLAN OF CARE: Admit to inpatient   PATIENT DISPOSITION:  PACU - hemodynamically stable.   Delay start of Pharmacological VTE agent (>24hrs) due to surgical blood loss or risk of bleeding: not applicable

## 2011-07-03 NOTE — Progress Notes (Signed)
Orthopedic Tech Progress Note Patient Details:  [name] [birth year] [id number]  CPM Right Knee CPM Right Knee: On Right Knee Flexion (Degrees): 60  Right Knee Extension (Degrees): 0  Additional Comments: trapeze bar   Cammer, Mickie Bail 07/03/2011, 11:35 AM

## 2011-07-03 NOTE — Transfer of Care (Signed)
Immediate Anesthesia Transfer of Care Note  Patient: [name]  Procedure(s) Performed: Procedure(s) (LRB): TOTAL KNEE ARTHROPLASTY (Right)  Patient Location: PACU  Anesthesia Type: General  Level of Consciousness: awake, alert , oriented and patient cooperative  Airway & Oxygen Therapy: Patient Spontanous Breathing and Patient connected to nasal cannula oxygen  Post-op Assessment: Report given to PACU RN and Post -op Vital signs reviewed and stable  Post vital signs: Reviewed and stable  Complications: No apparent anesthesia complications

## 2011-07-03 NOTE — Progress Notes (Signed)
Patient ID: [name], female   DOB: September 07, 1927, 78 y.o.   MRN: [id number] There has been no change in health status since  the current H&P.I have examined the patient and discussed the surgery. No contraindications to the planned procedure exist.

## 2011-07-03 NOTE — Anesthesia Postprocedure Evaluation (Signed)
  Anesthesia Post-op Note  Patient: [name]  Procedure(s) Performed: Procedure(s) (LRB): TOTAL KNEE ARTHROPLASTY (Right)  Patient Location: PACU  Anesthesia Type: General  Level of Consciousness: awake  Airway and Oxygen Therapy: Patient Spontanous Breathing  Post-op Pain: mild  Post-op Assessment: Post-op Vital signs reviewed  Post-op Vital Signs: Reviewed  Complications: No apparent anesthesia complications

## 2011-07-03 NOTE — Anesthesia Postprocedure Evaluation (Signed)
  Anesthesia Post-op Note  Patient: [name]  Procedure(s) Performed: Procedure(s) (LRB): TOTAL KNEE ARTHROPLASTY (Right)  Patient Location: PACU  Anesthesia Type: General and GA combined with regional for post-op pain  Level of Consciousness: awake  Airway and Oxygen Therapy: Patient Spontanous Breathing  Post-op Pain: mild  Post-op Assessment: Post-op Vital signs reviewed  Post-op Vital Signs: Reviewed  Complications: No apparent anesthesia complications

## 2011-07-04 DIAGNOSIS — M159 Polyosteoarthritis, unspecified: Secondary | ICD-10-CM

## 2011-07-04 DIAGNOSIS — D62 Acute posthemorrhagic anemia: Secondary | ICD-10-CM | POA: Diagnosis not present

## 2011-07-04 DIAGNOSIS — M1710 Unilateral primary osteoarthritis, unspecified knee: Secondary | ICD-10-CM | POA: Diagnosis present

## 2011-07-04 DIAGNOSIS — J45909 Unspecified asthma, uncomplicated: Secondary | ICD-10-CM

## 2011-07-04 DIAGNOSIS — I10 Essential (primary) hypertension: Secondary | ICD-10-CM

## 2011-07-04 DIAGNOSIS — M179 Osteoarthritis of knee, unspecified: Secondary | ICD-10-CM | POA: Diagnosis present

## 2011-07-04 LAB — BASIC METABOLIC PANEL
BUN: 27 mg/dL — ABNORMAL HIGH (ref 6–23)
Chloride: 108 mEq/L (ref 96–112)
Creatinine, Ser: 1.37 mg/dL — ABNORMAL HIGH (ref 0.50–1.10)
GFR calc Af Amer: 40 mL/min — ABNORMAL LOW (ref >90)

## 2011-07-04 LAB — CBC
HCT: 27.1 % — ABNORMAL LOW (ref 36.0–46.0)
HCT: 33.9 % — ABNORMAL LOW (ref 36.0–46.0)
Hemoglobin: 11.3 g/dL — ABNORMAL LOW (ref 12.0–15.0)
MCH: 30.1 pg (ref 26.0–34.0)
MCHC: 32.1 g/dL (ref 30.0–36.0)
MCHC: 33.3 g/dL (ref 30.0–36.0)
MCV: 90.9 fL (ref 78.0–100.0)
RDW: 13.9 % (ref 11.5–15.5)
WBC: 5.6 10*3/uL (ref 4.0–10.5)

## 2011-07-04 MED ORDER — DIPHENHYDRAMINE HCL 25 MG PO CAPS
25.0000 mg | ORAL_CAPSULE | Freq: Four times a day (QID) | ORAL | Status: DC | PRN
  Administered 2011-07-04: 25 mg via ORAL
  Filled 2011-07-04: qty 1

## 2011-07-04 MED ORDER — SODIUM CHLORIDE 0.9 % IV SOLN: INTRAVENOUS | Status: DC

## 2011-07-04 MED ORDER — AMLODIPINE BESYLATE 10 MG PO TABS
10.0000 mg | ORAL_TABLET | Freq: Every day | ORAL | Status: DC
  Administered 2011-07-05 – 2011-07-06 (×2): 10 mg via ORAL
  Filled 2011-07-04 (×2): qty 1

## 2011-07-04 NOTE — Evaluation (Signed)
Physical Therapy Evaluation Patient Details Name: [name] MRN: [id number] DOB: 10-28-1927 Today's Date: 07/04/2011 Time: 5784-6962 PT Time Calculation (min): 26 min  PT Assessment / Plan / Recommendation Clinical Impression  pt presents s/p R TKA.  pt very motivated and plans on D/C to [facility] since she lives alone.      PT Assessment  Patient needs continued PT services    Follow Up Recommendations  Skilled nursing facility    Barriers to Discharge None      Equipment Recommendations  Defer to next venue    Recommendations for Other Services     Frequency 7X/week    Precautions / Restrictions Precautions Precautions: Knee Restrictions Weight Bearing Restrictions: Yes RLE Weight Bearing: Partial weight bearing RLE Partial Weight Bearing Percentage or Pounds: 50%   Pertinent Vitals/Pain 4/10      Mobility  Bed Mobility Bed Mobility: Supine to Sit;Sitting - Scoot to Edge of Bed Supine to Sit: 4: Min assist Sitting - Scoot to [place] of Bed: 4: Min assist Details for Bed Mobility Assistance: A with R LE only, cues for use of UEs.   Transfers Transfers: Sit to Stand;Stand to Sit Sit to Stand: 4: Min assist;With upper extremity assist;From bed;From toilet Stand to Sit: 4: Min assist;With upper extremity assist;To toilet;To chair/3-in-1;With armrests Details for Transfer Assistance: cues for use of UEs, positioning of LEs.  A for anterior wt shift Ambulation/Gait Ambulation/Gait Assistance: 4: Min assist Ambulation Distance (Feet): 30 Feet Assistive device: Rolling walker Ambulation/Gait Assistance Details: cues for use of RW, movement of RW, upright posture, sequencing.   Gait Pattern: Step-to pattern;Decreased step length - left;Decreased stance time - right;Decreased stride length Stairs: No Wheelchair Mobility Wheelchair Mobility: No    Exercises Total Joint Exercises Ankle Circles/Pumps: AROM;Both;10 reps Quad Sets: AROM;Both;10 reps Knee Flexion:  AAROM;Right;5 reps Goniometric ROM: ~10-60   PT Diagnosis: Abnormality of gait;Acute pain  PT Problem List: Decreased strength;Decreased range of motion;Decreased activity tolerance;Decreased balance;Decreased mobility;Decreased knowledge of use of DME;Decreased knowledge of precautions;Pain PT Treatment Interventions: DME instruction;Gait training;Stair training;Functional mobility training;Therapeutic activities;Therapeutic exercise;Balance training;Patient/family education   PT Goals Acute Rehab PT Goals PT Goal Formulation: With patient Time For Goal Achievement: 07/11/11 Potential to Achieve Goals: Good Pt will go Supine/Side to Sit: with modified independence PT Goal: Supine/Side to Sit - Progress: Goal set today Pt will go Sit to Supine/Side: with modified independence PT Goal: Sit to Supine/Side - Progress: Goal set today Pt will go Sit to Stand: with supervision PT Goal: Sit to Stand - Progress: Goal set today Pt will go Stand to Sit: with supervision PT Goal: Stand to Sit - Progress: Goal set today Pt will Ambulate: >150 feet;with supervision;with rolling walker PT Goal: Ambulate - Progress: Goal set today Pt will Perform Home Exercise Program: Independently PT Goal: Perform Home Exercise Program - Progress: Goal set today  Visit Information  Last PT Received On: 07/04/11 Assistance Needed: +1    Subjective Data  Subjective: I need to go to the bathroom.   Patient Stated Goal: Get better.     Prior Functioning  Home Living Lives With: Alone Type of Home: House Home Access: Stairs to enter Entrance Stairs-Number of Steps: 2 Entrance Stairs-Rails: Right Home Layout: Two level;Able to live on main level with bedroom/bathroom Bathroom Shower/Tub: Health visitor: Handicapped height Home Adaptive Equipment: Grab bars around toilet;Grab bars in shower;Walker - rolling;Bedside commode/3-in-1;Straight cane Prior Function Level of Independence:  Independent Driving: Yes Vocation: Retired Comments: pt plans to  D/C to [facility] Communication Communication: No difficulties Dominant Hand: Right    Cognition  Overall Cognitive Status: Appears within functional limits for tasks assessed/performed Arousal/Alertness: Awake/alert Orientation Level: Appears intact for tasks assessed Behavior During Session: [facility] for tasks performed    Extremity/Trunk Assessment Right Upper Extremity Assessment RUE ROM/Strength/Tone: [facility] for tasks assessed Left Upper Extremity Assessment LUE ROM/Strength/Tone: WFL for tasks assessed Right Lower Extremity Assessment RLE ROM/Strength/Tone: Deficits RLE ROM/Strength/Tone Deficits: AAROM ~10-60 RLE Sensation: WFL - Light Touch Left Lower Extremity Assessment LLE ROM/Strength/Tone: WFL for tasks assessed LLE Sensation: WFL - Light Touch   Balance Balance Balance Assessed: No  End of Session PT - End of Session Equipment Utilized During Treatment: Gait belt Activity Tolerance: Patient tolerated treatment well Patient left: in chair;with call bell/phone within reach Nurse Communication: Mobility status  GP     Sunny Schlein, Octa [phone] 07/04/2011, 3:26 PM

## 2011-07-04 NOTE — Evaluation (Signed)
Occupational Therapy Evaluation Patient Details Name: [name] MRN: [id number] DOB: 04/27/1927 Today's Date: 07/04/2011 Time: 4132-4401 OT Time Calculation (min): 21 min  OT Assessment / Plan / Recommendation Clinical Impression  Pt doing well POD 1 RTKR. Skilled OT indicated to maximize I w/BADLs to supervision level in prep for d/c to next venue of care.    OT Assessment  Patient needs continued OT Services    Follow Up Recommendations  Skilled nursing facility    Barriers to Discharge Decreased caregiver support    Equipment Recommendations  Defer to next venue    Recommendations for Other Services    Frequency  Min 1X/week    Precautions / Restrictions     Pertinent Vitals/Pain     ADL  Grooming: Simulated;Set up Where Assessed - Grooming: Unsupported sitting Upper Body Bathing: Simulated;Set up Where Assessed - Upper Body Bathing: Unsupported sitting Lower Body Bathing: Simulated;Moderate assistance Where Assessed - Lower Body Bathing: Supported sit to stand Upper Body Dressing: Simulated;Set up Where Assessed - Upper Body Dressing: Unsupported sitting Lower Body Dressing: Simulated;Moderate assistance Where Assessed - Lower Body Dressing: Supported sit to Pharmacist, hospital: Performed;Minimal Dentist Method: Surveyor, minerals: Materials engineer and Hygiene: Simulated;Moderate assistance Where Assessed - Toileting Clothing Manipulation and Hygiene: Sit to stand from 3-in-1 or toilet Tub/Shower Transfer Method: Stand pivot Equipment Used: Rolling walker Transfers/Ambulation Related to ADLs: Pt fatigued, receiving blood, agreeable to transfer to [facility].    OT Diagnosis: Generalized weakness  OT Problem List: Decreased activity tolerance;Decreased knowledge of use of DME or AE OT Treatment Interventions: Self-care/ADL training;Therapeutic activities;DME and/or AE  instruction;Patient/family education   OT Goals Acute Rehab OT Goals OT Goal Formulation: With patient Time For Goal Achievement: 07/11/11 Potential to Achieve Goals: Good ADL Goals Pt Will Perform Grooming: with supervision;Standing at sink ADL Goal: Grooming - Progress: Goal set today Pt Will Perform Lower Body Bathing: with supervision;Sit to stand from chair;Sit to stand from bed ADL Goal: Lower Body Bathing - Progress: Goal set today Pt Will Perform Lower Body Dressing: with supervision;Sit to stand from chair;Sit to stand from bed ADL Goal: Lower Body Dressing - Progress: Goal set today Pt Will Transfer to Toilet: with supervision;3-in-1;Comfort height toilet;Ambulation ADL Goal: Toilet Transfer - Progress: Goal set today Pt Will Perform Toileting - Clothing Manipulation: with supervision;Standing ADL Goal: Toileting - Clothing Manipulation - Progress: Goal set today Pt Will Perform Toileting - Hygiene: with supervision;Sit to stand from 3-in-1/toilet ADL Goal: Toileting - Hygiene - Progress: Goal set today  Visit Information  Last OT Received On: 07/04/11 Assistance Needed: +1    Subjective Data  Subjective: Ive had this done before. Patient Stated Goal: Return home following short rehab stay at [facility].   Prior Functioning  Home Living Lives With: Alone Type of Home: House Home Access: Stairs to enter Entrance Stairs-Number of Steps: 2 Entrance Stairs-Rails: Right Home Layout: Two level;Able to live on main level with bedroom/bathroom Bathroom Shower/Tub: Health visitor: Handicapped height Home Adaptive Equipment: Grab bars around toilet;Grab bars in shower;Walker - rolling;Bedside commode/3-in-1;Straight cane Prior Function Level of Independence: Independent Driving: Yes Vocation: Retired Musician: No difficulties Dominant Hand: Right    Cognition  Overall Cognitive Status: Appears within functional limits for tasks  assessed/performed Arousal/Alertness: Awake/alert Orientation Level: Appears intact for tasks assessed Behavior During Session: [facility] for tasks performed    Extremity/Trunk Assessment Right Upper Extremity Assessment RUE ROM/Strength/Tone: [facility] for tasks assessed Left  Upper Extremity Assessment LUE ROM/Strength/Tone: WFL for tasks assessed   Mobility Transfers Transfers: Sit to Stand;Stand to Sit Sit to Stand: 4: Min assist;With upper extremity assist;From chair/3-in-1;With armrests Stand to Sit: 4: Min assist;With upper extremity assist;With armrests;To chair/3-in-1 Details for Transfer Assistance: Mod VCs for hand placement and RLE management.   Exercise    Balance    End of Session OT - End of Session Activity Tolerance: Patient tolerated treatment well Patient left: in chair;with call bell/phone within reach  GO     Roxsana Riding A OTR/L [phone] 07/04/2011, 3:02 PM

## 2011-07-04 NOTE — Progress Notes (Signed)
[facility] Consult Follow-up Note  [name]:[id number] DOB: 1927/07/24 DOA: 07/03/2011 PCP: Michiel Sites, MD Requesting physician: Dr Cleophas Dunker Reason for consultation: MEDICAL MANAGEMENT.   Brief narrative:  This is a 78 year old, Caucasian female, with past medical history of hypertension, biliary cirrhosis, osteoarthritis, asthma, who underwent a right total knee replacement earlier today. We were asked to see the patient for management of her medical problems. Patient is currently lying comfortably on the bed. She denies any pain. Denies any chest pain or shortness of breath. No nausea, vomiting. Denies abdominal pain. The surgery was uneventful. Her daughter is at the bedside       Procedures:  Right TKR  Interim History:  no new complaints.    Objective: Filed Vitals:   07/04/11 1030 07/04/11 1130 07/04/11 1232 07/04/11 1303  BP: 131/56 141/56  137/55  Pulse: 81 85  90  Temp: 98.5 F (36.9 C) 98.4 F (36.9 C)  99.2 F (37.3 C)  TempSrc: Oral Oral  Oral  Resp: 16 16  18   Height:      Weight:      SpO2:   96% 99%     Intake/Output Summary (Last 24 hours) at 07/04/11 1515 Last data filed at 07/04/11 0925  Gross per 24 hour  Intake    995 ml  Output    850 ml  Net    145 ml    Exam: General appearance: alert, cooperative, appears stated age and no distress  Resp: CTAB, no wheezing.  Cardio: regular rate and rhythm, S1, S2 normal, no murmur, click, rub or gallop  GI: soft, non-tender; bowel sounds normal; no masses, no organomegaly  Extremities: extremities normal, atraumatic, no cyanosis or edema   Data Reviewed: Basic Metabolic Panel:  Lab 07/04/11 8119 07/03/11 1320  NA 140 139  K 4.3 4.6  CL 108 106  CO2 26 25  GLUCOSE 107* 151*  BUN 27* 29*  CREATININE 1.37* 1.18*  CALCIUM 8.2* 8.8  MG -- --  PHOS -- --   Liver Function Tests:  Lab 07/03/11 1320  AST 25  ALT 21  ALKPHOS 86  BILITOT 0.2*  PROT 5.8*  ALBUMIN  3.0*   No results found for this basename: LIPASE:5,AMYLASE:5 in the last 168 hours No results found for this basename: AMMONIA:5 in the last 168 hours CBC:  Lab 07/04/11 0440 07/03/11 1320  WBC 5.6 7.2  NEUTROABS -- --  HGB 8.7* 10.4*  HCT 27.1* 31.7*  MCV 90.9 90.1  PLT 162 190   Cardiac Enzymes: No results found for this basename: CKTOTAL:5,CKMB:5,CKMBINDEX:5,TROPONINI:5 in the last 168 hours BNP: No components found with this basename: POCBNP:5 CBG: No results found for this basename: GLUCAP:5 in the last 168 hours  Recent Results (from the past 240 hour(s))  SURGICAL PCR SCREEN     Status: Abnormal   Collection Time   06/26/11 12:03 PM      Component Value Range Status Comment   MRSA, PCR NEGATIVE  NEGATIVE Final    Staphylococcus aureus POSITIVE (*) NEGATIVE Final   URINE CULTURE     Status: Normal   Collection Time   06/26/11 12:03 PM      Component Value Range Status Comment   Specimen Description URINE, CLEAN CATCH   Final    Special Requests NONE   Final    Culture  Setup Time [id number]   Final    Colony Count 15,000 COLONIES/ML   Final    Culture  Final    Value: Multiple bacterial morphotypes present, none predominant. Suggest appropriate recollection if clinically indicated.   Report Status 06/27/2011 FINAL   Final      Studies:  Chest 2 View  07/03/2011  *RADIOLOGY REPORT*  Clinical Data: Preoperative evaluation.  CHEST - 2 VIEW  Comparison: Chest x-ray 03/14/2011.  Findings: Mild diffuse interstitial prominence is again noted, most severe throughout the periphery of the lung bases, which could suggest underlying interstitial lung disease.  No acute consolidative airspace disease.  Minimal blunting of the left costophrenic sulcus may suggest presence of a left-sided pleural effusion. No evidence of pulmonary edema.  Heart size is mildly enlarged. The patient is rotated to the right on today's exam, resulting in distortion of the mediastinal contours and  reduced diagnostic sensitivity and specificity for mediastinal pathology. Atherosclerotic calcifications within the arch of the aorta. Extensive bilateral apical pleuroparenchymal thickening is similar to prior examinations, most likely representative of chronic scarring.  IMPRESSION: 1.  Trace left-sided pleural effusion is new compared to the prior examination 03/14/2011. 2.  Background of chronic interstitial prominence in the lungs, as above, suggestive of an underlying interstitial lung disease.  This could be better evaluated with non emergent high resolution CT of the thorax if clinically indicated. 3.  Mild cardiomegaly. 4.  Atherosclerosis.  Original Report Authenticated By: Florencia Reasons, M.D.    Scheduled Meds:   . acetaminophen  1,000 mg Intravenous Q6H  . amLODipine  10 mg Oral Daily  . ceFAZolin      .  ceFAZolin (ANCEF) IV  1 g Intravenous Q6H  . chlorhexidine  60 mL Topical Q2000  . chlorhexidine  60 mL Topical Once  . docusate sodium  100 mg Oral BID  . estradiol  0.5 mg Oral QHS  . HYDROmorphone      . ketorolac  15 mg Intravenous Q6H  . levothyroxine  112 mcg Oral Q breakfast  . medroxyPROGESTERone  2.5 mg Oral QHS  . methocarbamol      . oxyCODONE      . pantoprazole  40 mg Oral Q1200  . rivaroxaban  10 mg Oral Q24H  . tiotropium  18 mcg Inhalation Daily  . triamterene-hydrochlorothiazide  1 each Oral Daily  . DISCONTD: amLODipine  5 mg Oral Daily  . DISCONTD: irbesartan  150 mg Oral Daily   Continuous Infusions:   . sodium chloride    . DISCONTD: sodium chloride 75 mL/hr (07/04/11 0222)     Impression/Recommendations: #1 history of hypertension: Monitor blood pressure closely. Will hold her avapro for worsening renal function.  #2 history of hypothyroidism. Continue with levothyroxine.  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  #4 history of asthma. Continue with Spiriva nebulizers as needed. Nebulizer treatments  will be utilized as needed  #5 pulmonary fibrosis seen on chest x-ray: This is probably causing the crackles in the lung. Patient does not appear to have shortness of breath. Continue to monitor closely. She did have a pulomnary function test prior to her surgery.  #6 status post right total knee replacement: Patient appears to be stable at this time. She was cleared by [facility] Heart and vascular cardiology service prior to surgery. She underwent an echocardiogram, which showed normal systolic function with mild diastolic dysfunction. Please be cautious with IV fluids.  #7 DVT, prophylaxis per orthopedic,   Code Status: full code  Disposition Plan: Per attending service  Kathlen Mody , MD  [facility] Pager [phone]  If 7PM-7AM, please contact night-coverage [email or web address] password [facility] 07/04/2011, 3:15 PM   LOS: 1 day

## 2011-07-04 NOTE — Progress Notes (Signed)
Patient ID: [name], female   DOB: 16-May-1927, 78 y.o.   MRN: [id number] PATIENT ID: [name]        MRN:  [id number]          DOB/AGE: 03-04-1927 / 78 y.o.  [name], MD   Jacqualine Code, PA-C [address] [place], [place], [place]  [address]                             [phone]   PROGRESS NOTE  Subjective:  negative for Chest Pain  negative for Shortness of Breath  negative for Nausea/Vomiting   negative for Calf Pain  negative for Bowel Movement   Tolerating Diet: yes         Patient reports pain as mild.     C/o cough and right heel pain.  Has not been doing incentive.  Objective: Vital signs in last 24 hours:   Patient Vitals for the past 24 hrs:  BP Temp Temp src Pulse Resp SpO2 Height Weight  07/04/11 0610 138/59 mmHg 99.1 F (37.3 C) - 85  18  95 % - -  07/04/11 0253 140/61 mmHg 99.1 F (37.3 C) - 81  18  97 % - -  07/03/11 2243 131/52 mmHg 98.8 F (37.1 C) - 87  - 99 % - -  07/03/11 1341 134/54 mmHg 97.4 F (36.3 C) Oral 75  14  98 % - -  07/03/11 1300 - - - - - - 5' 4.17" (1.63 m) -  07/03/11 1208 126/55 mmHg - - 80  13  100 % - -  07/03/11 1200 - 97.9 F (36.6 C) - - - - - -  07/03/11 1138 125/58 mmHg - - 81  9  100 % - -  07/03/11 1123 124/66 mmHg - - 87  12  100 % - -  07/03/11 1108 127/67 mmHg - - 86  14  100 % - -  07/03/11 1053 133/57 mmHg - - 87  11  100 % - -  07/03/11 1038 133/44 mmHg - - 88  14  100 % - -  07/03/11 1023 132/55 mmHg - - 88  16  99 % - -  07/03/11 1008 153/58 mmHg - - - - - - -  07/03/11 1000 153/58 mmHg 98.2 F (36.8 C) - 92  16  99 % - -  07/03/11 0900 - - - - - - - 69.5 kg (153 lb 3.5 oz)      Intake/Output from previous day:   06/25 0701 - 06/26 0700 In: 2050 [I.V.:2050] Out: 1850 [Urine:1300; Drains:450]   Intake/Output this shift:       Intake/Output      06/25 0701 - 06/26 0700 06/26 0701 - 06/27 0700   I.V. (mL/kg) 2050 (29.5)    Total Intake(mL/kg) 2050 (29.5)    Urine (mL/kg/hr) 1300 (0.8)     Drains 450    Blood 100    Total Output 1850    Net +200            LABORATORY DATA:  Basename 07/04/11 0440 07/03/11 1320  WBC 5.6 7.2  HGB 8.7* 10.4*  HCT 27.1* 31.7*  PLT 162 190    Basename 07/04/11 0440 07/03/11 1320  NA 140 139  K 4.3 4.6  CL 108 106  CO2 26 25  BUN 27* 29*  CREATININE 1.37* 1.18*  GLUCOSE 107* 151*  CALCIUM  8.2* 8.8   Lab Results  Component Value Date   INR 1.09 06/26/2011   INR 2.33* 12/10/2008   INR 1.81* 12/09/2008    Examination:  General appearance: alert, cooperative and mild distress Resp: diminished breath sounds bilaterally and rales bilaterally Cardio: regular rate and rhythm GI: normal findings: bowel sounds normal  Wound Exam: clean, dry, intact dressing  Drainage:  None: wound tissue dry  Motor Exam: EHL, FHL, Anterior Tibial and Posterior Tibial Intact  Sensory Exam: Superficial Peroneal, Deep Peroneal and Tibial normal  Vascular Exam: Right posterior tibial artery has trace pulse  Assessment:    1 Day Post-Op  Procedure(s) (LRB): TOTAL KNEE ARTHROPLASTY (Right)  ADDITIONAL DIAGNOSIS:  Active Problems:  HTN (hypertension)  Hypothyroidism  Pulmonary fibrosis  OA (osteoarthritis)  Asthma  Non-alcoholic cirrhosis  Acute Blood Loss Anemia   Plan: Physical Therapy as ordered Touch Down Weight Bearing (TDWB)  DVT Prophylaxis:  Xarelto, Foot Pumps and TED hose  DISCHARGE PLAN: Skilled Nursing Facility/Rehab  DISCHARGE NEEDS: Walker  OOB today.  Will give one unit PRBC's today since hgb 8.7  And has impaired renal functions.  Do not want her to become hypotensive.         Reatha Sur 07/04/2011, 7:58 AM

## 2011-07-05 ENCOUNTER — Encounter (HOSPITAL_COMMUNITY): Payer: Self-pay | Admitting: Orthopaedic Surgery

## 2011-07-05 DIAGNOSIS — I10 Essential (primary) hypertension: Secondary | ICD-10-CM

## 2011-07-05 DIAGNOSIS — J84112 Idiopathic pulmonary fibrosis: Secondary | ICD-10-CM

## 2011-07-05 DIAGNOSIS — J45909 Unspecified asthma, uncomplicated: Secondary | ICD-10-CM

## 2011-07-05 DIAGNOSIS — M159 Polyosteoarthritis, unspecified: Secondary | ICD-10-CM

## 2011-07-05 LAB — BASIC METABOLIC PANEL
Calcium: 8.5 mg/dL (ref 8.4–10.5)
Creatinine, Ser: 1.08 mg/dL (ref 0.50–1.10)
GFR calc Af Amer: 54 mL/min — ABNORMAL LOW (ref >90)
GFR calc non Af Amer: 46 mL/min — ABNORMAL LOW (ref >90)

## 2011-07-05 LAB — CBC
MCH: 29.8 pg (ref 26.0–34.0)
MCHC: 33.4 g/dL (ref 30.0–36.0)
MCV: 89.2 fL (ref 78.0–100.0)
Platelets: 160 10*3/uL (ref 150–400)
RDW: 14.1 % (ref 11.5–15.5)
WBC: 8.1 10*3/uL (ref 4.0–10.5)

## 2011-07-05 LAB — TYPE AND SCREEN: ABO/RH(D): A POS

## 2011-07-05 MED ORDER — OXYCODONE HCL 5 MG PO TABS: 5.0000 mg | ORAL_TABLET | ORAL | Status: AC | PRN

## 2011-07-05 MED ORDER — METHOCARBAMOL 500 MG PO TABS: 500.0000 mg | ORAL_TABLET | Freq: Three times a day (TID) | ORAL | Status: AC | PRN

## 2011-07-05 MED ORDER — WHITE PETROLATUM GEL
Status: AC
  Filled 2011-07-05: qty 5

## 2011-07-05 MED ORDER — RIVAROXABAN 10 MG PO TABS: 10.0000 mg | ORAL_TABLET | ORAL | Status: DC

## 2011-07-05 NOTE — Progress Notes (Signed)
PT Progress Note:     07/05/11 1400  PT Visit Information  Last PT Received On 07/05/11  Assistance Needed +1  PT Time Calculation  PT Start Time 1309  PT Stop Time 1339  PT Time Calculation (min) 30 min  Precautions  Precautions Knee  Restrictions  Weight Bearing Restrictions Yes  RLE Weight Bearing PWB  RLE Partial Weight Bearing Percentage or Pounds 50  Cognition  Overall Cognitive Status Appears within functional limits for tasks assessed/performed  Arousal/Alertness Awake/alert  Orientation Level Appears intact for tasks assessed  Behavior During Session [facility] for tasks performed  Bed Mobility  Bed Mobility Not assessed  Transfers  Transfers Sit to Stand;Stand to Sit  Sit to Stand 4: Min guard;With upper extremity assist;With armrests;From chair/3-in-1  Stand to Sit 4: Min guard;With upper extremity assist;With armrests;To chair/3-in-1  Details for Transfer Assistance Cues for technique  Ambulation/Gait  Ambulation/Gait Assistance 4: Min guard  Ambulation Distance (Feet) 60 Feet  Assistive device Rolling walker  Ambulation/Gait Assistance Details Cues for sequencing, safe RW advancement, tall posture, 50% PWBing, terminal knee extension to prevent buckling of Rt knee.    Gait Pattern Step-to pattern;Decreased stance time - right;Decreased step length - left;Decreased weight shift to right;Decreased hip/knee flexion - left;Decreased hip/knee flexion - right;Trunk flexed;Antalgic  Gait velocity slow  Stairs No  Wheelchair Mobility  Wheelchair Mobility No  Balance  Balance Assessed No  Exercises  Exercises Total Joint  Total Joint Exercises  Ankle Circles/Pumps AROM;Both;10 reps  Quad Sets AROM;Both;10 reps  Knee Flexion AAROM;Right;10 reps  Heel Slides AAROM;Right;10 reps  Hip ABduction/ADduction AAROM;Right;10 reps  Straight Leg Raises AAROM;Right;10 reps  Long [address] [place];Right;10 reps  PT - End of Session  Equipment Utilized During Treatment Gait belt    Activity Tolerance Patient limited by pain;Patient tolerated treatment well  Patient left in chair;with call bell/phone within reach;with family/visitor present  PT - Assessment/Plan  Comments on Treatment Session Pt able to increase ambulation distance today.   Pt with difficulty tolerating knee ROM due to pain per pt.    PT Plan Discharge plan remains appropriate  PT Frequency 7X/week  Follow Up Recommendations Skilled nursing facility  Equipment Recommended Defer to next venue  Acute Rehab PT Goals  Time For Goal Achievement 07/11/11  Potential to Achieve Goals Good  PT Goal: Sit to Stand - Progress Progressing toward goal  PT Goal: Stand to Sit - Progress Progressing toward goal  PT Goal: Ambulate - Progress Progressing toward goal  PT Goal: Perform Home Exercise Program - Progress Progressing toward goal     Pain:  7/10 knee.  Repositioned.  Pt reports having pain medication recently.    Verdell Face, [place] [phone] 07/05/2011

## 2011-07-05 NOTE — Discharge Summary (Signed)
Diana Campbell, MD   Jacqualine Code, PA-C [address] [place], [place], [place]  [address]                             [phone]  PATIENT ID: [name]        MRN:  [id number]          DOB/AGE: [birth year] / 78 y.o.    DISCHARGE SUMMARY  ADMISSION DATE:    07/03/2011 DISCHARGE DATE:   07/06/2011   ADMISSION DIAGNOSIS: osteoarthritis right knee    DISCHARGE DIAGNOSIS:  osteoarthritis right knee    ADDITIONAL DIAGNOSIS:  HTN (hypertension)  Hypothyroidism  Pulmonary fibrosis  OA (osteoarthritis)  Asthma  Non-alcoholic cirrhosis  Postoperative anemia due to acute blood loss  Past Medical History  Diagnosis Date  . COPD (chronic obstructive pulmonary disease)   . Hyperlipemia   . Heart murmur   . Shortness of breath     with exertion  . Asthma   . Pneumonia     hx of frequent episodes of pneumonia  . Headache     MIgraine- Visualchanges- flashing lights and cant focus and cant see.  Marland Kitchen GERD (gastroesophageal reflux disease)   . Arthritis   . Diverticulitis 2008  . Pulmonary fibrosis   . Hypertension   . Cirrhosis of liver not due to alcohol     PROCEDURE: Procedure(s): RIGHT TOTAL KNEE ARTHROPLASTY on 07/03/2011  CONSULTS:   [facility]  HISTORY: [name] is seen today for evaluation of her right knee. She has had long-standing problems with her right knee for many months. She was seen back in January of this year with a 4 month history of extreme pain and discomfort in the right knee after a twisting injury when she bent over to put something in the garbage. She twisted her knee and had severe medial pain in the right knee since that time. She has had already a left total knee replacement with fairly good results. In regards to her right knee though she is having this constant right knee pain which is more throbbing and aching pain with occasional sharpness. It wakes her at nighttime. She is now having pain with every step as well as with activities of  daily living. She has tried multiple medications and even still uses Motrin and Aleve. She is on glucosamine and chondroitin. All of them, however, are not helping as much anymore. She denies any neurovascular compromise   HOSPITAL COURSE:  [name] is a 78 y.o. admitted on 07/03/2011 and found to have a diagnosis of osteoarthritis right knee.  After appropriate laboratory studies were obtained  they were taken to the operating room on 07/03/2011 and underwent Procedure(s): RIGHT TOTAL KNEE ARTHROPLASTY.   They were given perioperative antibiotics:  Anti-infectives     Start     Dose/Rate Route Frequency Ordered Stop   07/03/11 1500   ceFAZolin (ANCEF) IVPB 1 g/50 mL premix        1 g 100 mL/hr over 30 Minutes Intravenous Every 6 hours 07/03/11 1346 07/03/11 2211   07/03/11 0637   ceFAZolin (ANCEF) 1-5 GM-% IVPB     Comments: ALTMAN, DEBORAH: cabinet override         07/03/11 0637 07/03/11 1844   07/02/11 1441   ceFAZolin (ANCEF) IVPB 1 g/50 mL premix        1 g 100 mL/hr over 30 Minutes Intravenous 60 min pre-op 07/02/11 1441  07/03/11 0805        .  Tolerated the procedure well.  Placed with a foley intraoperatively.  Given Ofirmev at induction and for 48 hours.    POD #1, allowed out of bed to a chair.  PT for ambulation and exercise program.  Foley D/C'd in morning.  IV saline locked.  O2 discontionued  Hemovac pulled. Given 1 unit PRBC's without difficulty.  Indicated for age and ABLA with elevation of renal studies.  POD #2, continued PT and ambulation.   POD #3. Continued PT.  Discharged to [facility]. . The remainder of the hospital course was dedicated to ambulation and strengthening.   The patient was discharged on 3 Days Post-Op in  Stable condition.  Blood products given:none and one unit CC PRBC  DIAGNOSTIC STUDIES: Recent vital signs:  Patient Vitals for the past 24 hrs:  BP Temp Temp src Pulse Resp SpO2  07/06/11 0505 141/54 mmHg 99.9 F (37.7 C) Oral 94   18  96 %  2011/08/02 2250 - 99.8 F (37.7 C) - - - -  2011/08/02 2200 141/50 mmHg 101 F (38.3 C) Oral 95  20  95 %  08/02/2011 1317 150/52 mmHg 100.1 F (37.8 C) Oral 96  18  98 %  2011/08/02 0958 134/76 mmHg - - - - -       Recent laboratory studies:  Basename 07/06/11 0556 August 02, 2011 0525 07/04/11 1652 07/04/11 0440 07/03/11 1320  WBC 8.2 8.1 10.0 5.6 7.2  HGB 9.8* 10.5* 11.3* 8.7* 10.4*  HCT 29.3* 31.4* 33.9* 27.1* 31.7*  PLT 170 160 189 162 190    Basename 07/06/11 0556 08-02-2011 0525 07/04/11 0440 07/03/11 1320  NA 136 136 140 139  K 3.8 4.1 4.3 4.6  CL 103 103 108 106  CO2 25 22 26 25   BUN 16 18 27* 29*  CREATININE 1.10 1.08 1.37* 1.18*  GLUCOSE 135* 126* 107* 151*  CALCIUM 8.5 8.5 8.2* 8.8   Lab Results  Component Value Date   INR 1.09 06/26/2011   INR 2.33* 12/10/2008   INR 1.81* 12/09/2008     Recent Radiographic Studies :   Chest 2 View  07/03/2011  *RADIOLOGY REPORT*  Clinical Data: Preoperative evaluation.  CHEST - 2 VIEW  Comparison: Chest x-ray 03/14/2011.  Findings: Mild diffuse interstitial prominence is again noted, most severe throughout the periphery of the lung bases, which could suggest underlying interstitial lung disease.  No acute consolidative airspace disease.  Minimal blunting of the left costophrenic sulcus may suggest presence of a left-sided pleural effusion. No evidence of pulmonary edema.  Heart size is mildly enlarged. The patient is rotated to the right on today's exam, resulting in distortion of the mediastinal contours and reduced diagnostic sensitivity and specificity for mediastinal pathology. Atherosclerotic calcifications within the arch of the aorta. Extensive bilateral apical pleuroparenchymal thickening is similar to prior examinations, most likely representative of chronic scarring.  IMPRESSION: 1.  Trace left-sided pleural effusion is new compared to the prior examination 03/14/2011. 2.  Background of chronic interstitial prominence in the lungs,  as above, suggestive of an underlying interstitial lung disease.  This could be better evaluated with non emergent high resolution CT of the thorax if clinically indicated. 3.  Mild cardiomegaly. 4.  Atherosclerosis.  Original Report Authenticated By: Florencia Reasons, M.D.    DISCHARGE INSTRUCTIONS: Discharge Orders    Future Orders Please Complete By Expires   Diet general      Call MD / Call 911  Comments:   If you experience chest pain or shortness of breath, CALL 911 and be transported to the hospital emergency room.  If you develope a fever above 101 F, pus (white drainage) or increased drainage or redness at the wound, or calf pain, call your surgeon's office.   Constipation Prevention      Comments:   Drink plenty of fluids.  Prune juice may be helpful.  You may use a stool softener, such as Colace (over the counter) 100 mg twice a day.  Use MiraLax (over the counter) for constipation as needed.   Increase activity slowly as tolerated      Patient may shower      Comments:   You may shower without a dressing once there is no drainage.  Do not wash over the wound.  If drainage remains, cover wound with plastic wrap and then shower.   Partial weight bearing      Comments:   50 % WEIGHT BEARING AS TAUGHT IN PHYSICAL THERAPY   Driving restrictions      Comments:   No driving for 6 weeks   Lifting restrictions      Comments:   No lifting for 6 weeks   CPM      Comments:   Continuous passive motion machine (CPM):      Use the CPM from 0 to 60 for 8 hours per day.      You may increase by 5-10 per day.  You may break it up into 2 or 3 sessions per day.      Use CPM for 3 weeks or until you are told to stop.   NO HEEL PRESSURE!!           Change dressing      Comments:   Change dressing on Sunday, then change the dressing daily with sterile 4 x 4 inch gauze dressing and apply TED hose.  You may clean the incision with alcohol prior to redressing.   Do not put a pillow  under the knee. Place it under the heel BUT KEEP HEEL OFF OF PILLOW.  Let it float         DISCHARGE MEDICATIONS:   Medication List  As of 07/06/2011  8:16 AM   STOP taking these medications         ALEVE 220 MG Caps      aspirin EC 81 MG tablet      meloxicam 15 MG tablet      MOTRIN PM PO      traMADol 50 MG tablet      vitamin E 400 UNIT capsule         TAKE these medications         albuterol 108 (90 BASE) MCG/ACT inhaler   Commonly known as: PROVENTIL HFA;VENTOLIN HFA   Inhale 2 puffs into the lungs every 6 (six) hours as needed. For shortness of breath      amLODipine-olmesartan 5-20 MG per tablet   Commonly known as: AZOR   Take 1 tablet by mouth daily.      Calcium 600-200 MG-UNIT per tablet   Take 1 tablet by mouth daily.      estradiol 0.5 MG tablet   Commonly known as: ESTRACE   Take 0.5 mg by mouth at bedtime.      Fish Oil 1000 MG Caps   Take 1 capsule by mouth daily.      GLUCOSAMINE CHONDROITIN PLUS PO   Take 1 tablet  by mouth daily.      levothyroxine 112 MCG tablet   Commonly known as: SYNTHROID, LEVOTHROID   Take 112 mcg by mouth daily.      magnesium oxide 400 MG tablet   Commonly known as: MAG-OX   Take 400 mg by mouth daily.      medroxyPROGESTERone 2.5 MG tablet   Commonly known as: PROVERA   Take 2.5 mg by mouth daily.      methocarbamol 500 MG tablet   Commonly known as: ROBAXIN   Take 1 tablet (500 mg total) by mouth every 8 (eight) hours as needed (spasms).      omeprazole 20 MG capsule   Commonly known as: PRILOSEC   Take 20 mg by mouth as needed. For heart burn      oxyCODONE 5 MG immediate release tablet   Commonly known as: Oxy IR/ROXICODONE   Take 1-2 tablets (5-10 mg total) by mouth every 4 (four) hours as needed.      rivaroxaban 10 MG Tabs tablet   Commonly known as: XARELTO   Take 1 tablet (10 mg total) by mouth daily.      SYSTANE OP   Place 1 drop into both eyes 3 (three) times daily as needed. For dry eyes        tiotropium 18 MCG inhalation capsule   Commonly known as: SPIRIVA   Place 18 mcg into inhaler and inhale daily.      triamterene-hydrochlorothiazide 37.5-25 MG per capsule   Commonly known as: DYAZIDE   Take 1 capsule by mouth daily.      VITAMIN D-3 PO   Take 1 tablet by mouth daily.          Vaseline to nares as needed.  FOLLOW UP VISIT:   Follow-up Information    Follow up with [facility] on 07/18/2011.   Contact information:   [address] [place] [address] [phone]          DISPOSITION:  Skilled nursing facility    CONDITION:  Stable   Krystl Wickware 07/06/2011, 8:16 AM

## 2011-07-05 NOTE — Progress Notes (Signed)
[facility] Consult Follow-up Note  [name]:[id number] DOB: 05/31/1927 DOA: 07/03/2011 PCP: Michiel Sites, MD Requesting physician: Dr Cleophas Dunker Reason for consultation: MEDICAL MANAGEMENT.   Brief narrative:  This is a 78 year old, Caucasian female, with past medical history of hypertension, biliary cirrhosis, osteoarthritis, asthma, who underwent a right total knee replacement earlier today. We were asked to see the patient for management of her medical problems. Patient is currently lying comfortably on the bed. She denies any pain. Denies any chest pain or shortness of breath. No nausea, vomiting. Denies abdominal pain. The surgery was uneventful. Her daughter is at the bedside       Procedures:  Right TKR  Interim History:  no new complaints.    Objective: Filed Vitals:   07/04/11 2100 07/05/11 0636 07/05/11 0958 07/05/11 1317  BP: 154/49 153/68 134/76 150/52  Pulse: 95 88  96  Temp: 100.2 F (37.9 C) 99.9 F (37.7 C)  100.1 F (37.8 C)  TempSrc:    Oral  Resp: 18 16  18   Height:      Weight:      SpO2: 96% 98%  98%    No intake or output data in the 24 hours ending 07/05/11 1825  Exam: General appearance: alert, cooperative, appears stated age and no distress  Resp: CTAB, no wheezing.  Cardio: regular rate and rhythm, S1, S2 normal, no murmur, click, rub or gallop  GI: soft, non-tender; bowel sounds normal; no masses, no organomegaly  Extremities: extremities normal, atraumatic, no cyanosis or edema   Data Reviewed: Basic Metabolic Panel:  Lab 07/05/11 8119 07/04/11 0440 07/03/11 1320  NA 136 140 139  K 4.1 4.3 4.6  CL 103 108 106  CO2 22 26 25   GLUCOSE 126* 107* 151*  BUN 18 27* 29*  CREATININE 1.08 1.37* 1.18*  CALCIUM 8.5 8.2* 8.8  MG -- -- --  PHOS -- -- --   Liver Function Tests:  Lab 07/03/11 1320  AST 25  ALT 21  ALKPHOS 86  BILITOT 0.2*  PROT 5.8*  ALBUMIN 3.0*   No results found for this basename:  LIPASE:5,AMYLASE:5 in the last 168 hours No results found for this basename: AMMONIA:5 in the last 168 hours CBC:  Lab 07/05/11 0525 07/04/11 1652 07/04/11 0440 07/03/11 1320  WBC 8.1 10.0 5.6 7.2  NEUTROABS -- -- -- --  HGB 10.5* 11.3* 8.7* 10.4*  HCT 31.4* 33.9* 27.1* 31.7*  MCV 89.2 90.4 90.9 90.1  PLT 160 189 162 190   Cardiac Enzymes: No results found for this basename: CKTOTAL:5,CKMB:5,CKMBINDEX:5,TROPONINI:5 in the last 168 hours BNP: No components found with this basename: POCBNP:5 CBG: No results found for this basename: GLUCAP:5 in the last 168 hours  Recent Results (from the past 240 hour(s))  SURGICAL PCR SCREEN     Status: Abnormal   Collection Time   06/26/11 12:03 PM      Component Value Range Status Comment   MRSA, PCR NEGATIVE  NEGATIVE Final    Staphylococcus aureus POSITIVE (*) NEGATIVE Final   URINE CULTURE     Status: Normal   Collection Time   06/26/11 12:03 PM      Component Value Range Status Comment   Specimen Description URINE, CLEAN CATCH   Final    Special Requests NONE   Final    Culture  Setup Time [id number]   Final    Colony Count 15,000 COLONIES/ML   Final    Culture     Final  Value: Multiple bacterial morphotypes present, none predominant. Suggest appropriate recollection if clinically indicated.   Report Status 06/27/2011 FINAL   Final      Studies:  Chest 2 View  07/03/2011  *RADIOLOGY REPORT*  Clinical Data: Preoperative evaluation.  CHEST - 2 VIEW  Comparison: Chest x-ray 03/14/2011.  Findings: Mild diffuse interstitial prominence is again noted, most severe throughout the periphery of the lung bases, which could suggest underlying interstitial lung disease.  No acute consolidative airspace disease.  Minimal blunting of the left costophrenic sulcus may suggest presence of a left-sided pleural effusion. No evidence of pulmonary edema.  Heart size is mildly enlarged. The patient is rotated to the right on today's exam, resulting in  distortion of the mediastinal contours and reduced diagnostic sensitivity and specificity for mediastinal pathology. Atherosclerotic calcifications within the arch of the aorta. Extensive bilateral apical pleuroparenchymal thickening is similar to prior examinations, most likely representative of chronic scarring.  IMPRESSION: 1.  Trace left-sided pleural effusion is new compared to the prior examination 03/14/2011. 2.  Background of chronic interstitial prominence in the lungs, as above, suggestive of an underlying interstitial lung disease.  This could be better evaluated with non emergent high resolution CT of the thorax if clinically indicated. 3.  Mild cardiomegaly. 4.  Atherosclerosis.  Original Report Authenticated By: Florencia Reasons, M.D.    Scheduled Meds:    . amLODipine  10 mg Oral Daily  . chlorhexidine  60 mL Topical Q2000  . chlorhexidine  60 mL Topical Once  . docusate sodium  100 mg Oral BID  . estradiol  0.5 mg Oral QHS  . levothyroxine  112 mcg Oral Q breakfast  . medroxyPROGESTERone  2.5 mg Oral QHS  . pantoprazole  40 mg Oral Q1200  . rivaroxaban  10 mg Oral Q24H  . tiotropium  18 mcg Inhalation Daily  . triamterene-hydrochlorothiazide  1 each Oral Daily  . white petrolatum       Continuous Infusions:    . sodium chloride       Impression/Recommendations: #1 history of hypertension: Monitor blood pressure closely. Will hold her avapro for worsening renal function.  #2 history of hypothyroidism. Continue with levothyroxine.  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  #4 history of asthma. Continue with Spiriva nebulizers as needed. Nebulizer treatments will be utilized as needed  #5 pulmonary fibrosis seen on chest x-ray: This is probably causing the crackles in the lung. Patient does not appear to have shortness of breath. Continue to monitor closely. She did have a pulomnary function test prior to her surgery.  #6 status  post right total knee replacement: Patient appears to be stable at this time. She was cleared by [facility]. Heart and vascular cardiology service prior to surgery. She underwent an echocardiogram, which showed normal systolic function with mild diastolic dysfunction. Please be cautious with IV fluids.  #7 low grade temp: probably secondary to atelectasis. If her temp >100.4, will get blood cultures, ua and cxr.  #7 DVT, prophylaxis per orthopedic,   Code Status: full code  Disposition Plan: Per attending service  Kathlen Mody , MD  [facility] Pager [phone]  If 7PM-7AM, please contact night-coverage [email or web address] password [facility] 07/05/2011, 6:25 PM   LOS: 2 days

## 2011-07-05 NOTE — Progress Notes (Signed)
Patient ID: [name], female   DOB: 05-Dec-1927, 78 y.o.   MRN: [id number] PATIENT ID: [name]        MRN:  [id number]          DOB/AGE: 24-Mar-1927 / 78 y.o.  Diana Campbell, MD   Jacqualine Code, PA-C [address] [place], [place], [place]  [address]                             [phone]   PROGRESS NOTE  Subjective:  negative for Chest Pain  negative for Shortness of Breath  negative for Nausea/Vomiting   negative for Calf Pain  negative for Bowel Movement   Tolerating Diet: yes         Patient reports pain as mild.      Objective: Vital signs in last 24 hours:   Patient Vitals for the past 24 hrs:  BP Temp Pulse Resp SpO2  07/05/11 0958 134/76 mmHg - - - -  07/05/11 0636 153/68 mmHg 99.9 F (37.7 C) 88  16  98 %  07/04/11 2100 154/49 mmHg 100.2 F (37.9 C) 95  18  96 %      Intake/Output from previous day:   06/26 0701 - 06/27 0700 In: 515 [I.V.:500] Out: 50 [Urine:50]   Intake/Output this shift:       Intake/Output      06/26 0701 - 06/27 0700 06/27 0701 - 06/28 0700   P.O.     I.V. (mL/kg) 500 (7.2)    Blood 15    Total Intake(mL/kg) 515 (7.4)    Urine (mL/kg/hr) 50 (0)    Drains     Blood     Total Output 50    Net +465         Urine Occurrence 1 x       LABORATORY DATA:  Basename 07/05/11 0525 07/04/11 1652 07/04/11 0440 07/03/11 1320  WBC 8.1 10.0 5.6 7.2  HGB 10.5* 11.3* 8.7* 10.4*  HCT 31.4* 33.9* 27.1* 31.7*  PLT 160 189 162 190    Basename 07/05/11 0525 07/04/11 0440 07/03/11 1320  NA 136 140 139  K 4.1 4.3 4.6  CL 103 108 106  CO2 22 26 25   BUN 18 27* 29*  CREATININE 1.08 1.37* 1.18*  GLUCOSE 126* 107* 151*  CALCIUM 8.5 8.2* 8.8   Lab Results  Component Value Date   INR 1.09 06/26/2011   INR 2.33* 12/10/2008   INR 1.81* 12/09/2008    Examination:  General appearance: alert, cooperative and no distress  Wound Exam: clean, dry, intact   Drainage:  None: wound tissue dry  Motor Exam: EHL, FHL, Anterior Tibial  and Posterior Tibial Intact  Sensory Exam: Superficial Peroneal, Deep Peroneal and Tibial normal  Vascular Exam: Normal  Assessment:    2 Days Post-Op  Procedure(s) (LRB): TOTAL KNEE ARTHROPLASTY (Right)  ADDITIONAL DIAGNOSIS:  Principal Problem:  *Osteoarthritis of knee Active Problems:  HTN (hypertension)  Hypothyroidism  Pulmonary fibrosis  OA (osteoarthritis)  Asthma  Non-alcoholic cirrhosis  Postoperative anemia due to acute blood loss  Acute Blood Loss Anemia   Plan: Physical Therapy as ordered Partial Weight Bearing @ 50% (PWB)  DVT Prophylaxis:  Xarelto  DISCHARGE PLAN: Skilled Nursing Facility/Rehab  DISCHARGE NEEDS: has equipment at home   CBC, BUN and creatinine have stabilized-doing well, expect D/C tomorrow to [facility], Rogue Pautler W 07/05/2011, 1:06 PM

## 2011-07-05 NOTE — Clinical Social Work Placement (Addendum)
    Clinical Social Work Department CLINICAL SOCIAL WORK PLACEMENT NOTE 07/05/2011  Patient:  DESSIREE, SZE  Account Number:  [id number] Admit date:  07/03/2011  Clinical Social Worker:  Lupita Leash Kailoni Vahle, BSW  Date/time:  07/04/2011 05:30 PM  Clinical Social Work is seeking post-discharge placement for this patient at the following level of care:   SKILLED NURSING   (*CSW will update this form in Epic as items are completed)     Patient/family provided with [facility] Department of Clinical Social Work's list of facilities offering this level of care within the geographic area requested by the patient (or if unable, by the patient's family).    Patient/family informed of their freedom to choose among providers that offer the needed level of care, that participate in Medicare, Medicaid or managed care program needed by the patient, have an available bed and are willing to accept the patient.    Patient/family informed of [facility]' ownership interest in [facility], as well as of the fact that they are under no obligation to receive care at this facility.  PASARR submitted to EDS on 07/04/2011 PASARR number received from EDS on 07/04/2011  FL2 transmitted to all facilities in geographic area requested by pt/family on  07/05/2011 FL2 transmitted to all facilities within larger geographic area on   Patient informed that his/her managed care company has contracts with or will negotiate with  certain facilities, including the following:   Patient has [facility]- Medicare Complete.  She declined SNF list as she plans to go to [facility].     Patient/family informed of bed offers received:  07/06/2011 Patient chooses bed at [facility] Physician recommends and patient chooses bed at    Patient to be transferred to [facility] on  07/06/2011 Patient to be transferred to facility by [facility]  The following physician request were entered  in Epic:   Additional Comments: Spoke with [facility]- Admissions at [facility]. She will have a bed available for patient tomorrow. OK per MD for d/c tomorrow. FL2 completed and signed.  DC to [facility] 07/06/11. Patient and family are pleased with d/c plan.  Nursing and SNF notified.   Lorri Frederick. [place]  [phone]

## 2011-07-05 NOTE — Clinical Social Work Psychosocial (Addendum)
     Clinical Social Work Department BRIEF PSYCHOSOCIAL ASSESSMENT 07/05/2011  Patient:  [name], [name]     Account Number:  [id number]     Admit date:  07/03/2011  Clinical Social Worker:  Burnard Hawthorne  Date/Time:  07/04/2011 05:00 PM  Referred by:  Physician  Date Referred:  07/04/2011 Referred for  SNF Placement   Other Referral:   Interview type:  Patient Other interview type:    PSYCHOSOCIAL DATA Living Status:  ALONE Admitted from facility:   Level of care:   Primary support name:  Aletta Edouard Primary support relationship to patient:  CHILD, ADULT Degree of support available:   Very strong family support    CURRENT CONCERNS Current Concerns  Post-Acute Placement   Other Concerns:    SOCIAL WORK ASSESSMENT / PLAN CSW referred to assist patient with short term SNF placement. She states taht she has pretoured [facility] and wants to go there for her SNF placement.  FL2 will be initiated and forwarded to SNF. Message left for Janie at [facility] to iniate referral   Assessment/plan status:  Psychosocial Support/Ongoing Assessment of Needs Other assessment/ plan:   Information/referral to community resources:   SNF list given to patient. Discussed "back-up" plan in case bed at [facility] is not available. Pt's second choice is [facility].    PATIENTS/FAMILYS RESPONSE TO PLAN OF CARE: Patient is alert, oriented and very pleasant. She states she will be happy to go to [facility] for her SNF placement.

## 2011-07-06 DIAGNOSIS — I10 Essential (primary) hypertension: Secondary | ICD-10-CM

## 2011-07-06 DIAGNOSIS — J45909 Unspecified asthma, uncomplicated: Secondary | ICD-10-CM

## 2011-07-06 DIAGNOSIS — J84112 Idiopathic pulmonary fibrosis: Secondary | ICD-10-CM

## 2011-07-06 DIAGNOSIS — M159 Polyosteoarthritis, unspecified: Secondary | ICD-10-CM

## 2011-07-06 LAB — BASIC METABOLIC PANEL
BUN: 16 mg/dL (ref 6–23)
Chloride: 103 mEq/L (ref 96–112)
Glucose, Bld: 135 mg/dL — ABNORMAL HIGH (ref 70–99)
Potassium: 3.8 mEq/L (ref 3.5–5.1)

## 2011-07-06 LAB — CBC
HCT: 29.3 % — ABNORMAL LOW (ref 36.0–46.0)
Hemoglobin: 9.8 g/dL — ABNORMAL LOW (ref 12.0–15.0)
MCHC: 33.4 g/dL (ref 30.0–36.0)

## 2011-07-06 NOTE — Progress Notes (Signed)
[facility] Consult Follow-up Note  [facility] [name]:[id number] DOB: [birth year] DOA: 07/03/2011 PCP: Michiel Sites, MD Requesting physician: Dr Cleophas Dunker Reason for consultation: MEDICAL MANAGEMENT.   Brief narrative:  This is a 78 year old, Caucasian female, with past medical history of hypertension, biliary cirrhosis, osteoarthritis, asthma, who underwent a right total knee replacement earlier today. We were asked to see the patient for management of her medical problems. Patient is currently lying comfortably on the bed. She denies any pain. Denies any chest pain or shortness of breath. No nausea, vomiting. Denies abdominal pain. The surgery was uneventful. Her daughter is at the bedside       Procedures:  Right TKR     Objective: Filed Vitals:   07/05/11 2200 07/05/11 2250 07/06/11 0505 07/06/11 0959  BP: 141/50  141/54 136/66  Pulse: 95  94   Temp: 101 F (38.3 C) 99.8 F (37.7 C) 99.9 F (37.7 C)   TempSrc: Oral  Oral   Resp: 20  18   Height:      Weight:      SpO2: 95%  96%     No intake or output data in the 24 hours ending 07/06/11 1008  Exam: General appearance: alert, cooperative, appears stated age and no distress  Resp: CTAB, no wheezing.  Cardio: regular rate and rhythm, S1, S2 normal, no murmur, click, rub or gallop  GI: soft, non-tender; bowel sounds normal; no masses, no organomegaly  Extremities: extremities normal, atraumatic, no cyanosis or edema   Data Reviewed: Basic Metabolic Panel:  Lab 07/06/11 8119 07/05/11 0525 07/04/11 0440 07/03/11 1320  NA 136 136 140 139  K 3.8 4.1 4.3 4.6  CL 103 103 108 106  CO2 25 22 26 25   GLUCOSE 135* 126* 107* 151*  BUN 16 18 27* 29*  CREATININE 1.10 1.08 1.37* 1.18*  CALCIUM 8.5 8.5 8.2* 8.8  MG -- -- -- --  PHOS -- -- -- --   Liver Function Tests:  Lab 07/03/11 1320  AST 25  ALT 21  ALKPHOS 86  BILITOT 0.2*  PROT 5.8*  ALBUMIN 3.0*   No results found for this basename:  LIPASE:5,AMYLASE:5 in the last 168 hours No results found for this basename: AMMONIA:5 in the last 168 hours CBC:  Lab 07/06/11 0556 07/05/11 0525 07/04/11 1652 07/04/11 0440 07/03/11 1320  WBC 8.2 8.1 10.0 5.6 7.2  NEUTROABS -- -- -- -- --  HGB 9.8* 10.5* 11.3* 8.7* 10.4*  HCT 29.3* 31.4* 33.9* 27.1* 31.7*  MCV 88.8 89.2 90.4 90.9 90.1  PLT 170 160 189 162 190   Cardiac Enzymes: No results found for this basename: CKTOTAL:5,CKMB:5,CKMBINDEX:5,TROPONINI:5 in the last 168 hours BNP: No components found with this basename: POCBNP:5 CBG: No results found for this basename: GLUCAP:5 in the last 168 hours  Recent Results (from the past 240 hour(s))  SURGICAL PCR SCREEN     Status: Abnormal   Collection Time   06/26/11 12:03 PM      Component Value Range Status Comment   MRSA, PCR NEGATIVE  NEGATIVE Final    Staphylococcus aureus POSITIVE (*) NEGATIVE Final   URINE CULTURE     Status: Normal   Collection Time   06/26/11 12:03 PM      Component Value Range Status Comment   Specimen Description URINE, CLEAN CATCH   Final    Special Requests NONE   Final    Culture  Setup Time [id number]   Final    Colony Count 15,000 COLONIES/ML  Final    Culture     Final    Value: Multiple bacterial morphotypes present, none predominant. Suggest appropriate recollection if clinically indicated.   Report Status 06/27/2011 FINAL   Final      Studies:  Chest 2 View  07/03/2011  *RADIOLOGY REPORT*  Clinical Data: Preoperative evaluation.  CHEST - 2 VIEW  Comparison: Chest x-ray 03/14/2011.  Findings: Mild diffuse interstitial prominence is again noted, most severe throughout the periphery of the lung bases, which could suggest underlying interstitial lung disease.  No acute consolidative airspace disease.  Minimal blunting of the left costophrenic sulcus may suggest presence of a left-sided pleural effusion. No evidence of pulmonary edema.  Heart size is mildly enlarged. The patient is rotated to the  right on today's exam, resulting in distortion of the mediastinal contours and reduced diagnostic sensitivity and specificity for mediastinal pathology. Atherosclerotic calcifications within the arch of the aorta. Extensive bilateral apical pleuroparenchymal thickening is similar to prior examinations, most likely representative of chronic scarring.  IMPRESSION: 1.  Trace left-sided pleural effusion is new compared to the prior examination 03/14/2011. 2.  Background of chronic interstitial prominence in the lungs, as above, suggestive of an underlying interstitial lung disease.  This could be better evaluated with non emergent high resolution CT of the thorax if clinically indicated. 3.  Mild cardiomegaly. 4.  Atherosclerosis.  Original Report Authenticated By: Florencia Reasons, M.D.    Scheduled Meds:    . amLODipine  10 mg Oral Daily  . chlorhexidine  60 mL Topical Q2000  . chlorhexidine  60 mL Topical Once  . docusate sodium  100 mg Oral BID  . estradiol  0.5 mg Oral QHS  . levothyroxine  112 mcg Oral Q breakfast  . medroxyPROGESTERone  2.5 mg Oral QHS  . pantoprazole  40 mg Oral Q1200  . rivaroxaban  10 mg Oral Q24H  . tiotropium  18 mcg Inhalation Daily  . triamterene-hydrochlorothiazide  1 each Oral Daily  . white petrolatum       Continuous Infusions:    . DISCONTD: sodium chloride       Impression/Recommendations: #1 history of hypertension: better controlled. Her avapro was held for renal insufficiency. Her renal function is normal the last two days, hence she can be discharged on her home dose of azor 5/20mg  daily. Her bp measurements have been good. #2 history of hypothyroidism. Continue with levothyroxine.  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  #4 history of asthma. Continue with Spiriva nebulizers as needed. Nebulizer treatments will be utilized as needed  #5 pulmonary fibrosis seen on chest x-ray: This is probably causing the  crackles in the lung. Patient does not appear to have shortness of breath. Continue to monitor closely. She did have a pulomnary function test prior to her surgery.  #6 status post right total knee replacement: Patient appears to be stable at this time. She was cleared by [facility]. Heart and vascular cardiology service prior to surgery. She underwent an echocardiogram, which showed normal systolic function with mild diastolic dysfunction. #7 low grade temp: probably secondary to atelectasis. Encourage incentive spirometry. If her temp >100.4, get blood cultures, ua and cxr.  #7 DVT, prophylaxis per orthopedic, Will sign off . Please call if needed.   Code Status: full code  Disposition Plan: Per attending service  Kathlen Mody , MD  [facility] Pager [phone]  If 7PM-7AM, please contact night-coverage [email or web address] password [email or web address] 07/06/2011, 10:08 AM   LOS:  3 days

## 2011-07-06 NOTE — Progress Notes (Addendum)
Patient ID: [name], female   DOB: [birth year], 78 y.o.   MRN: [id number] PATIENT ID: [name]        MRN:  [id number]          DOB/AGE: [birth year] / 78 y.o.  Diana Campbell, MD   Jacqualine Code, PA-C [address] [place], [place], [place]  [address]                             [phone]   PROGRESS NOTE  Subjective:  negative for Chest Pain  negative for Shortness of Breath  negative for Nausea/Vomiting   negative for Calf Pain  negative for Bowel Movement   Tolerating Diet: yes         Patient reports pain as mild.     Pain controlled.  Has right heel pain. Small amount of pink blood with mucous when she blows her nose.  Objective: Vital signs in last 24 hours:   Patient Vitals for the past 24 hrs:  BP Temp Temp src Pulse Resp SpO2  07/06/11 0505 141/54 mmHg 99.9 F (37.7 C) Oral 94  18  96 %  07/05/11 2250 - 99.8 F (37.7 C) - - - -  07/05/11 2200 141/50 mmHg 101 F (38.3 C) Oral 95  20  95 %  07/05/11 1317 150/52 mmHg 100.1 F (37.8 C) Oral 96  18  98 %  07/05/11 0958 134/76 mmHg - - - - -      Intake/Output from previous day:       Intake/Output this shift:       Intake/Output      06/27 0701 - 06/28 0700 06/28 0701 - 06/29 0700   I.V. (mL/kg)     Blood     Total Intake(mL/kg)     Urine (mL/kg/hr)     Total Output     Net          Urine Occurrence 2 x       LABORATORY DATA:  Basename 07/06/11 0556 07/05/11 0525 07/04/11 1652 07/04/11 0440 07/03/11 1320  WBC 8.2 8.1 10.0 5.6 7.2  HGB 9.8* 10.5* 11.3* 8.7* 10.4*  HCT 29.3* 31.4* 33.9* 27.1* 31.7*  PLT 170 160 189 162 190    Basename 07/06/11 0556 07/05/11 0525 07/04/11 0440 07/03/11 1320  NA 136 136 140 139  K 3.8 4.1 4.3 4.6  CL 103 103 108 106  CO2 25 22 26 25   BUN 16 18 27* 29*  CREATININE 1.10 1.08 1.37* 1.18*  GLUCOSE 135* 126* 107* 151*  CALCIUM 8.5 8.5 8.2* 8.8   Lab Results  Component Value Date   INR 1.09 06/26/2011   INR 2.33* 12/10/2008   INR 1.81* 12/09/2008     Examination:  General appearance: alert, cooperative and mild distress Resp: clear to auscultation bilaterally Cardio: regular rate and rhythm GI: normal findings: bowel sounds normal  Wound Exam: clean, dry, intact   Drainage:  None: wound tissue dry  Motor Exam: EHL, FHL, Anterior Tibial and Posterior Tibial Intact  Sensory Exam: Superficial Peroneal, Deep Peroneal and Tibial normal  Vascular Exam: Right posterior tibial artery has 1+ (weak) pulse  Right heel has some redness along lateral border of heel  Assessment:    3 Days Post-Op  Procedure(s) (LRB): TOTAL KNEE ARTHROPLASTY (Right)  ADDITIONAL DIAGNOSIS:  Principal Problem:  *Osteoarthritis of knee Active Problems:  HTN (hypertension)  Hypothyroidism  Pulmonary fibrosis  OA (osteoarthritis)  Asthma  Non-alcoholic cirrhosis  Postoperative anemia due to acute blood loss  Acute Blood Loss Anemia   Plan: Physical Therapy as ordered Partial Weight Bearing @ 50% (PWB)  DVT Prophylaxis:  Xarelto and TED hose  DISCHARGE PLAN: Skilled Nursing Facility/Rehab today  DISCHARGE NEEDS: HHPT, CPM, Walker and 3-in-1 comode seat once d/c from SNF        [facility] 07/06/2011, 8:08 AM

## 2011-07-06 NOTE — Progress Notes (Signed)
PT Progress Note:     07/06/11 1400  PT Visit Information  Last PT Received On 07/06/11  Assistance Needed +1  PT Time Calculation  PT Start Time 1057  PT Stop Time 1121  PT Time Calculation (min) 24 min  Precautions  Precautions Knee  Restrictions  Weight Bearing Restrictions Yes  RLE Weight Bearing PWB  RLE Partial Weight Bearing Percentage or Pounds 50  Cognition  Overall Cognitive Status Appears within functional limits for tasks assessed/performed  Arousal/Alertness Awake/alert  Orientation Level Appears intact for tasks assessed  Behavior During Session [facility] for tasks performed  Bed Mobility  Bed Mobility Supine to Sit  Supine to Sit 4: Min assist  Details for Bed Mobility Assistance (A) for R LE  Transfers  Transfers Sit to Stand;Stand to Sit  Sit to Stand 4: Min guard;With upper extremity assist;From bed  Stand to Sit 4: Min guard;With upper extremity assist;With armrests;To chair/3-in-1  Details for Transfer Assistance cues for technique  Ambulation/Gait  Ambulation/Gait Assistance 4: Min guard  Ambulation Distance (Feet) 120 Feet  Assistive device Rolling walker  Ambulation/Gait Assistance Details cues to look up, tall posture, knee ext. during stance phase, reinforcement of PWB 50%.    Gait Pattern Step-to pattern;Decreased stance time - right;Decreased step length - left;Decreased weight shift to right;Decreased hip/knee flexion - left;Decreased hip/knee flexion - right;Trunk flexed;Antalgic  Gait velocity slow  Stairs No  Wheelchair Mobility  Wheelchair Mobility No  Balance  Balance Assessed No  Total Joint Exercises  Ankle Circles/Pumps AROM;Both;15 reps  Quad Sets AROM;Both;15 reps  Heel Slides AAROM;Right;10 reps;Limitations  Hip ABduction/ADduction AAROM;Right;15 reps  Straight Leg Raises AAROM;Right;15 reps  Long [address] [place];Right;10 reps;Limitations  Heel Slides Limitations (limited by pain)  Long Arc Quad Limitations limited by pain with  flexion  PT - End of Session  Equipment Utilized During Treatment Gait belt  Activity Tolerance Patient tolerated treatment well;Patient limited by pain  Patient left in chair;with call bell/phone within reach  PT - Assessment/Plan  PT Plan Discharge plan remains appropriate  PT Frequency 7X/week  Follow Up Recommendations Skilled nursing facility  Equipment Recommended Defer to next venue  Acute Rehab PT Goals  Time For Goal Achievement 07/11/11  PT Goal: Supine/Side to Sit - Progress Progressing toward goal  PT Goal: Sit to Stand - Progress Progressing toward goal  PT Goal: Stand to Sit - Progress Progressing toward goal  PT Goal: Ambulate - Progress Progressing toward goal  PT Goal: Perform Home Exercise Program - Progress Progressing toward goal    Pain:  9/10 knee with flexion.      Verdell Face, [place] [phone] 07/06/2011

## 2011-07-06 NOTE — Discharge Planning (Signed)
Report called to Tammy at [facility] [employer].  Doyle Askew, RN

## 2011-08-16 ENCOUNTER — Other Ambulatory Visit: Payer: Self-pay | Admitting: Obstetrics and Gynecology

## 2011-10-05 ENCOUNTER — Other Ambulatory Visit: Payer: Self-pay | Admitting: Obstetrics and Gynecology

## 2011-10-05 DIAGNOSIS — Z1231 Encounter for screening mammogram for malignant neoplasm of breast: Secondary | ICD-10-CM

## 2011-10-29 ENCOUNTER — Ambulatory Visit
Admission: RE | Admit: 2011-10-29 | Discharge: 2011-10-29 | Disposition: A | Discharge status: Home / Self Care | Payer: Medicare Other | Source: Ambulatory Visit | Attending: Obstetrics and Gynecology | Admitting: Obstetrics and Gynecology

## 2011-10-29 DIAGNOSIS — Z1231 Encounter for screening mammogram for malignant neoplasm of breast: Secondary | ICD-10-CM

## 2011-11-22 ENCOUNTER — Other Ambulatory Visit (HOSPITAL_COMMUNITY): Payer: Self-pay | Admitting: Internal Medicine

## 2011-11-22 DIAGNOSIS — M7989 Other specified soft tissue disorders: Secondary | ICD-10-CM

## 2011-11-27 ENCOUNTER — Other Ambulatory Visit (HOSPITAL_COMMUNITY): Payer: Self-pay | Admitting: Internal Medicine

## 2011-11-27 DIAGNOSIS — I872 Venous insufficiency (chronic) (peripheral): Secondary | ICD-10-CM

## 2011-11-28 ENCOUNTER — Other Ambulatory Visit (HOSPITAL_COMMUNITY): Payer: Self-pay | Admitting: Podiatry

## 2011-11-28 DIAGNOSIS — R0989 Other specified symptoms and signs involving the circulatory and respiratory systems: Secondary | ICD-10-CM

## 2011-12-03 ENCOUNTER — Encounter (HOSPITAL_COMMUNITY): Payer: Medicare Other

## 2011-12-11 ENCOUNTER — Ambulatory Visit (HOSPITAL_COMMUNITY)
Admission: RE | Admit: 2011-12-11 | Discharge: 2011-12-11 | Disposition: A | Discharge status: Home / Self Care | Payer: Medicare Other | Source: Ambulatory Visit | Attending: Endocrinology | Admitting: Endocrinology

## 2011-12-11 ENCOUNTER — Other Ambulatory Visit (HOSPITAL_COMMUNITY): Payer: Self-pay | Admitting: Endocrinology

## 2011-12-11 ENCOUNTER — Ambulatory Visit (HOSPITAL_COMMUNITY)
Admission: RE | Admit: 2011-12-11 | Discharge: 2011-12-11 | Disposition: A | Discharge status: Home / Self Care | Payer: Medicare Other | Source: Ambulatory Visit | Attending: Internal Medicine | Admitting: Internal Medicine

## 2011-12-11 ENCOUNTER — Other Ambulatory Visit (HOSPITAL_COMMUNITY): Payer: Self-pay | Admitting: Internal Medicine

## 2011-12-11 DIAGNOSIS — I872 Venous insufficiency (chronic) (peripheral): Secondary | ICD-10-CM

## 2011-12-11 DIAGNOSIS — R0989 Other specified symptoms and signs involving the circulatory and respiratory systems: Secondary | ICD-10-CM | POA: Insufficient documentation

## 2011-12-11 NOTE — Progress Notes (Signed)
BLE Arterial Doppler completed. [name] D  

## 2011-12-11 NOTE — Progress Notes (Signed)
BLE Arterial Doppler completed. Chauncy Lean

## 2011-12-11 NOTE — Progress Notes (Signed)
Bilateral LE venous study completed. [name]

## 2011-12-11 NOTE — Progress Notes (Signed)
BLE venous duplex completed. No evidence of VI noted. Chauncy Lean

## 2012-08-12 ENCOUNTER — Encounter: Payer: Self-pay | Admitting: *Deleted

## 2012-08-15 ENCOUNTER — Encounter: Payer: Self-pay | Admitting: Internal Medicine

## 2012-08-15 ENCOUNTER — Ambulatory Visit (INDEPENDENT_AMBULATORY_CARE_PROVIDER_SITE_OTHER): Payer: Medicare Other | Admitting: Internal Medicine

## 2012-08-15 VITALS — BP 110/60 | HR 90 | Ht 64.0 in | Wt 124.8 lb

## 2012-08-15 DIAGNOSIS — R0989 Other specified symptoms and signs involving the circulatory and respiratory systems: Secondary | ICD-10-CM

## 2012-08-15 DIAGNOSIS — R079 Chest pain, unspecified: Secondary | ICD-10-CM

## 2012-08-15 DIAGNOSIS — R0600 Dyspnea, unspecified: Secondary | ICD-10-CM

## 2012-08-15 DIAGNOSIS — R0902 Hypoxemia: Secondary | ICD-10-CM

## 2012-08-15 DIAGNOSIS — Z79899 Other long term (current) drug therapy: Secondary | ICD-10-CM

## 2012-08-15 DIAGNOSIS — R509 Fever, unspecified: Secondary | ICD-10-CM

## 2012-08-15 DIAGNOSIS — J8410 Pulmonary fibrosis, unspecified: Secondary | ICD-10-CM

## 2012-08-15 DIAGNOSIS — I10 Essential (primary) hypertension: Secondary | ICD-10-CM

## 2012-08-15 LAB — CBC WITH DIFFERENTIAL/PLATELET
Basophils Relative: 0 % (ref 0–1)
Eosinophils Absolute: 0 10*3/uL (ref 0.0–0.7)
HCT: 35 % — ABNORMAL LOW (ref 36.0–46.0)
Hemoglobin: 11.8 g/dL — ABNORMAL LOW (ref 12.0–15.0)
MCH: 28.8 pg (ref 26.0–34.0)
MCHC: 33.7 g/dL (ref 30.0–36.0)
Monocytes Absolute: 2.1 10*3/uL — ABNORMAL HIGH (ref 0.1–1.0)
Monocytes Relative: 16 % — ABNORMAL HIGH (ref 3–12)

## 2012-08-15 LAB — COMPREHENSIVE METABOLIC PANEL
Albumin: 3.7 g/dL (ref 3.5–5.2)
Alkaline Phosphatase: 180 U/L — ABNORMAL HIGH (ref 39–117)
BUN: 32 mg/dL — ABNORMAL HIGH (ref 6–23)
Glucose, Bld: 114 mg/dL — ABNORMAL HIGH (ref 70–99)
Total Bilirubin: 1.2 mg/dL (ref 0.3–1.2)

## 2012-08-15 NOTE — Progress Notes (Signed)
OFFICE NOTE  Chief Complaint:  Routine followup  Primary Care Physician: Michiel Sites, MD  HPI:  [name]  is an 79 year old female with a history of knee surgery on the right, low back surgery, hypertension, hypothyroidism, mild dyslipidemia and GERD. She was low risk for surgery, had an echo which showed a normal EF, but has had right lower extremity swelling. She reports she thought she had prior stripping to the left greater saphenous vein graft in the past. She underwent arterial and venous Dopplers of the legs on December 11, 2011 which showed a slightly reduced ABI on the right with an increased velocity at the popliteal artery suggesting 50% reduction, however, normal ABI on the left. The venous Dopplers indicated no evidence of venous insufficiency and all of the visualized vessels were present, including both the left and right greater saphenous veins, suggesting that if they had performed prior venous stripping that perhaps she had an accessory vessel removed but it was not the greater saphenous vein. Overall there is no clear evidence to support venous ablation or other treatment.   Her main complaint today is worsening shortness of breath. She is known to have COPD and pulmonary fibrosis. Recently, perhaps over the past few weeks, she has had worsening shortness of breath ultimately requiring home oxygen. She tells me that her O2 saturations were less than 80% in the office. She was not treated with antibiotics per her report nor given steroids. I do not see a chest x-ray since 2013. At that time a CT scan was recommended to further delineate the extent of her pulmonary fibrosis. She reports that she is having ongoing fevers and chills. Her temperature actually was 100.1 last evening and her MAXIMUM TEMPERATURE was 101.9. She is not coughing up any sputum but has significant bilateral pleuritic chest pain which is worse in the axillary areas. She is markedly fatigued and  appears in mild distress.  PMHx:  Past Medical History  Diagnosis Date  . COPD (chronic obstructive pulmonary disease)   . Hyperlipemia   . Heart murmur   . Shortness of breath     with exertion  . Asthma   . Pneumonia     hx of frequent episodes of pneumonia  . Headache(784.0)     MIgraine- Visualchanges- flashing lights and cant focus and cant see.  Marland Kitchen GERD (gastroesophageal reflux disease)   . Arthritis   . Diverticulitis 2008  . Pulmonary fibrosis   . Hypertension   . Cirrhosis of liver not due to alcohol   . Hypothyroidism     Past Surgical History  Procedure Laterality Date  . Eye surgery  05/2008    - bilateral  . Back surgery    . Total knee arthroplasty  11/2008  . Rotator cuff repair  2008    right  . Retinal tear repair cryotherapy    . Tonsillectomy    . Appendectomy    . Dilation and curettage of uterus    . Total knee arthroplasty  07/03/2011    Procedure: TOTAL KNEE ARTHROPLASTY;  Surgeon: Valeria Batman, MD;  Location: [facility] OR;  Service: Orthopedics;  Laterality: Right;  . Transthoracic echocardiogram  04/12/2011    EF>55%; mod concentric LVH; mild-mod TR  . Varicose vein surgery      FAMHx:  Family History  Problem Relation Age of Onset  . Multiple sclerosis Sister   . Diabetes Sister   . Heart disease Sister   . Heart attack Brother   .  Stroke Maternal Grandmother   . Hypertension Sister   . Hyperlipidemia Sister   . Stroke Sister     SOCHx:   reports that she has quit smoking. She has never used smokeless tobacco. She reports that she does not drink alcohol or use illicit drugs.  ALLERGIES:  No Known Allergies  ROS: A comprehensive review of systems was negative except for: Constitutional: positive for chills, fatigue, fevers and malaise Respiratory: positive for dyspnea on exertion, emphysema and pleurisy/chest pain  HOME MEDS: Current Outpatient Prescriptions  Medication Sig Dispense Refill  . albuterol (PROVENTIL HFA;VENTOLIN HFA)  108 (90 BASE) MCG/ACT inhaler Inhale 2 puffs into the lungs every 6 (six) hours as needed. For shortness of breath      . amLODipine (NORVASC) 10 MG tablet Take 10 mg by mouth daily.      Marland Kitchen aspirin 81 MG tablet Take 81 mg by mouth daily.      . Calcium 600-200 MG-UNIT per tablet Take 1 tablet by mouth daily.      . Cholecalciferol (VITAMIN D-3 PO) Take 2,000 Units by mouth daily.       Marland Kitchen estradiol (ESTRACE) 0.5 MG tablet Take 0.5 mg by mouth at bedtime.      . Glucos-Chond-Sterol-Fish Oil (GLUCOSAMINE CHONDROITIN PLUS PO) Take 1 tablet by mouth daily.      Marland Kitchen levothyroxine (SYNTHROID, LEVOTHROID) 112 MCG tablet Take 112 mcg by mouth daily.      . magnesium oxide (MAG-OX) 400 MG tablet Take 400 mg by mouth 2 (two) times daily.       . medroxyPROGESTERone (PROVERA) 2.5 MG tablet Take 2.5 mg by mouth daily.      . meloxicam (MOBIC) 15 MG tablet Take 1 tablet by mouth daily.      . methocarbamol (ROBAXIN) 500 MG tablet Take 500 mg by mouth daily.      . Omega-3 Fatty Acids (FISH OIL) 1000 MG CAPS Take 1 capsule by mouth daily.      Marland Kitchen omeprazole (PRILOSEC) 20 MG capsule Take 20 mg by mouth as needed. For heart burn      . Polyethyl Glycol-Propyl Glycol (SYSTANE OP) Place 1 drop into both eyes 3 (three) times daily as needed. For dry eyes      . silver sulfADIAZINE (SILVADENE) 1 % cream Apply 1 application topically daily.      Marland Kitchen tiotropium (SPIRIVA) 18 MCG inhalation capsule Place 18 mcg into inhaler and inhale daily.      . traMADol (ULTRAM) 50 MG tablet Take 50 mg by mouth every 6 (six) hours as needed for pain.      Marland Kitchen triamterene-hydrochlorothiazide (DYAZIDE) 37.5-25 MG per capsule Take 1 capsule by mouth daily.       No current facility-administered medications for this visit.    LABS/IMAGING: No results found for this or any previous visit (from the past 48 hour(s)). No results found.  VITALS: BP 110/60  Pulse 90  Ht 5\' 4"  (1.626 m)  Wt 124 lb 12.8 oz (56.609 kg)  BMI 21.41  kg/m2  EXAM: General appearance: alert and mild distress Neck: no adenopathy, no carotid bruit, no JVD, supple, symmetrical, trachea midline and thyroid not enlarged, symmetric, no tenderness/mass/nodules Lungs: diminished breath sounds bibasilar, dullness to percussion bibasilar and rales bilaterally Heart: regular rate and rhythm, S1, S2 normal, no murmur, click, rub or gallop Abdomen: soft, non-tender; bowel sounds normal; no masses,  no organomegaly Extremities: extremities normal, atraumatic, no cyanosis or edema Pulses: 2+ and symmetric Skin:  Skin color, texture, turgor normal. No rashes or lesions Neurologic: Grossly normal  EKG: Normal sinus rhythm at 90  ASSESSMENT: 1. COPD and pulmonary fibrosis, now on oxygen 2. Fevers and chills 3. Malaise and recent 20 pound weight loss  PLAN: 1.   Mrs. Wike appears to be worsening with regards to her COPD and pulmonary fibrosis. She continues to have fevers and chills with a MAXIMUM TEMPERATURE of 101. It does not seem that she's been on antibiotics recently. She was recently placed on oxygen. She has had about 20 pound weight loss since I saw her last and has virtually no appetite. I'm concerned about possible lung cancer or perhaps a chronic pneumonia which is not treated. Her shortness of breath could entirely be due to worsening pulmonary fibrosis, which may be idiopathic. She's not had a CT of the chest to my knowledge to further diagnose this has not currently followed by a pulmonologist. I tried to contact her primary care provider today but was unable to. Therefore I will order laboratory work including a CBC with differential and CMP. Also refer her for a chest CT scan to evaluate for the cause of her pleuritic chest pain, worsening hypoxia and possible infection. Note that the time course of her worsening shortness of breath is not acute, therefore I think pulmonary embolus is less likely.  She should followup with her primary care  provider and pulmonary evaluation is recommended.  Chrystie Nose, MD, [facility] Attending Cardiologist [facility]  Yoshiye Kraft C 08/15/2012, 5:44 PM

## 2012-08-15 NOTE — Patient Instructions (Addendum)
Your physician recommends that you return for lab work TODAY. CBC with diff, CMP  Dr Rennis Golden would like you to have a CT of your chest. You will have this done at [address]   Your physician wants you to follow-up in: 2-3 weeks.

## 2012-08-16 ENCOUNTER — Other Ambulatory Visit: Payer: Self-pay | Admitting: Internal Medicine

## 2012-08-16 DIAGNOSIS — D72829 Elevated white blood cell count, unspecified: Secondary | ICD-10-CM

## 2012-08-16 DIAGNOSIS — R509 Fever, unspecified: Secondary | ICD-10-CM

## 2012-08-16 DIAGNOSIS — J8410 Pulmonary fibrosis, unspecified: Secondary | ICD-10-CM

## 2012-08-16 DIAGNOSIS — J42 Unspecified chronic bronchitis: Secondary | ICD-10-CM

## 2012-08-16 MED ORDER — MOXIFLOXACIN HCL 400 MG PO TABS: 400.0000 mg | ORAL_TABLET | Freq: Every day | ORAL | Status: DC

## 2012-08-18 ENCOUNTER — Ambulatory Visit
Admission: RE | Admit: 2012-08-18 | Discharge: 2012-08-18 | Disposition: A | Discharge status: Home / Self Care | Payer: Medicare Other | Source: Ambulatory Visit | Attending: Internal Medicine | Admitting: Internal Medicine

## 2012-08-18 DIAGNOSIS — R509 Fever, unspecified: Secondary | ICD-10-CM

## 2012-08-18 DIAGNOSIS — R0902 Hypoxemia: Secondary | ICD-10-CM

## 2012-08-18 DIAGNOSIS — R0600 Dyspnea, unspecified: Secondary | ICD-10-CM

## 2012-08-19 ENCOUNTER — Other Ambulatory Visit: Payer: Self-pay | Admitting: *Deleted

## 2012-08-19 DIAGNOSIS — R9389 Abnormal findings on diagnostic imaging of other specified body structures: Secondary | ICD-10-CM

## 2012-08-19 DIAGNOSIS — Z79899 Other long term (current) drug therapy: Secondary | ICD-10-CM

## 2012-08-19 DIAGNOSIS — J189 Pneumonia, unspecified organism: Secondary | ICD-10-CM

## 2012-08-22 ENCOUNTER — Telehealth: Payer: Self-pay | Admitting: Internal Medicine

## 2012-08-22 NOTE — Telephone Encounter (Signed)
Per Eileen Stanford, RN, Dr. Rennis Golden has been following and communicating w/ pt.  Message forwarded to Dr. Rennis Golden.

## 2012-08-22 NOTE — Telephone Encounter (Signed)
Patient is having labs drawn downstairs today about 1:30.  She states she is not getting better and wants Dr. Rennis Bauer to see the results and to let him know that if she needs to go to the hospital she is willing to go.  Does not want to wait.  States Dr. Rennis Bauer will know what she is talking about.

## 2012-08-23 ENCOUNTER — Emergency Department (HOSPITAL_COMMUNITY): Payer: Medicare Other

## 2012-08-23 ENCOUNTER — Inpatient Hospital Stay (HOSPITAL_COMMUNITY)
Admission: EM | Admit: 2012-08-23 | Discharge: 2012-08-25 | DRG: 193 | Disposition: A | Discharge status: Home / Self Care | Payer: Medicare Other | Attending: Internal Medicine | Admitting: Internal Medicine

## 2012-08-23 ENCOUNTER — Encounter (HOSPITAL_COMMUNITY): Payer: Self-pay | Admitting: *Deleted

## 2012-08-23 DIAGNOSIS — Z823 Family history of stroke: Secondary | ICD-10-CM

## 2012-08-23 DIAGNOSIS — M1990 Unspecified osteoarthritis, unspecified site: Secondary | ICD-10-CM

## 2012-08-23 DIAGNOSIS — J8410 Pulmonary fibrosis, unspecified: Secondary | ICD-10-CM | POA: Diagnosis present

## 2012-08-23 DIAGNOSIS — J9611 Chronic respiratory failure with hypoxia: Secondary | ICD-10-CM | POA: Diagnosis present

## 2012-08-23 DIAGNOSIS — G43909 Migraine, unspecified, not intractable, without status migrainosus: Secondary | ICD-10-CM | POA: Diagnosis present

## 2012-08-23 DIAGNOSIS — N289 Disorder of kidney and ureter, unspecified: Secondary | ICD-10-CM

## 2012-08-23 DIAGNOSIS — J45909 Unspecified asthma, uncomplicated: Secondary | ICD-10-CM

## 2012-08-23 DIAGNOSIS — Z9981 Dependence on supplemental oxygen: Secondary | ICD-10-CM

## 2012-08-23 DIAGNOSIS — D62 Acute posthemorrhagic anemia: Secondary | ICD-10-CM

## 2012-08-23 DIAGNOSIS — E785 Hyperlipidemia, unspecified: Secondary | ICD-10-CM | POA: Diagnosis present

## 2012-08-23 DIAGNOSIS — R0902 Hypoxemia: Secondary | ICD-10-CM

## 2012-08-23 DIAGNOSIS — J189 Pneumonia, unspecified organism: Principal | ICD-10-CM | POA: Diagnosis present

## 2012-08-23 DIAGNOSIS — J449 Chronic obstructive pulmonary disease, unspecified: Secondary | ICD-10-CM

## 2012-08-23 DIAGNOSIS — Z8249 Family history of ischemic heart disease and other diseases of the circulatory system: Secondary | ICD-10-CM

## 2012-08-23 DIAGNOSIS — Z833 Family history of diabetes mellitus: Secondary | ICD-10-CM

## 2012-08-23 DIAGNOSIS — J9620 Acute and chronic respiratory failure, unspecified whether with hypoxia or hypercapnia: Secondary | ICD-10-CM | POA: Diagnosis present

## 2012-08-23 DIAGNOSIS — M129 Arthropathy, unspecified: Secondary | ICD-10-CM | POA: Diagnosis present

## 2012-08-23 DIAGNOSIS — K7460 Unspecified cirrhosis of liver: Secondary | ICD-10-CM

## 2012-08-23 DIAGNOSIS — E039 Hypothyroidism, unspecified: Secondary | ICD-10-CM | POA: Diagnosis present

## 2012-08-23 DIAGNOSIS — M1710 Unilateral primary osteoarthritis, unspecified knee: Secondary | ICD-10-CM

## 2012-08-23 DIAGNOSIS — Z96659 Presence of unspecified artificial knee joint: Secondary | ICD-10-CM

## 2012-08-23 DIAGNOSIS — K219 Gastro-esophageal reflux disease without esophagitis: Secondary | ICD-10-CM | POA: Diagnosis present

## 2012-08-23 DIAGNOSIS — Z8701 Personal history of pneumonia (recurrent): Secondary | ICD-10-CM

## 2012-08-23 DIAGNOSIS — J4489 Other specified chronic obstructive pulmonary disease: Secondary | ICD-10-CM | POA: Diagnosis present

## 2012-08-23 DIAGNOSIS — L97309 Non-pressure chronic ulcer of unspecified ankle with unspecified severity: Secondary | ICD-10-CM | POA: Diagnosis present

## 2012-08-23 DIAGNOSIS — I10 Essential (primary) hypertension: Secondary | ICD-10-CM | POA: Diagnosis present

## 2012-08-23 DIAGNOSIS — Z79899 Other long term (current) drug therapy: Secondary | ICD-10-CM

## 2012-08-23 DIAGNOSIS — R509 Fever, unspecified: Secondary | ICD-10-CM

## 2012-08-23 DIAGNOSIS — Z66 Do not resuscitate: Secondary | ICD-10-CM | POA: Diagnosis present

## 2012-08-23 DIAGNOSIS — Z87891 Personal history of nicotine dependence: Secondary | ICD-10-CM

## 2012-08-23 DIAGNOSIS — J9610 Chronic respiratory failure, unspecified whether with hypoxia or hypercapnia: Secondary | ICD-10-CM

## 2012-08-23 LAB — CBC WITH DIFFERENTIAL/PLATELET
Basophils Absolute: 0 10*3/uL (ref 0.0–0.1)
Basophils Absolute: 0 10*3/uL (ref 0.0–0.1)
Basophils Relative: 0 % (ref 0–1)
HCT: 33.6 % — ABNORMAL LOW (ref 36.0–46.0)
Hemoglobin: 10.7 g/dL — ABNORMAL LOW (ref 12.0–15.0)
Hemoglobin: 11.3 g/dL — ABNORMAL LOW (ref 12.0–15.0)
Lymphocytes Relative: 6 % — ABNORMAL LOW (ref 12–46)
Lymphs Abs: 0.9 10*3/uL (ref 0.7–4.0)
MCHC: 31.8 g/dL (ref 30.0–36.0)
Monocytes Absolute: 1.3 10*3/uL — ABNORMAL HIGH (ref 0.1–1.0)
Monocytes Relative: 11 % (ref 3–12)
Monocytes Relative: 9 % (ref 3–12)
Neutro Abs: 11.2 10*3/uL — ABNORMAL HIGH (ref 1.7–7.7)
Neutro Abs: 11.7 10*3/uL — ABNORMAL HIGH (ref 1.7–7.7)
Neutrophils Relative %: 80 % — ABNORMAL HIGH (ref 43–77)
Platelets: 376 10*3/uL (ref 150–400)
RBC: 3.91 MIL/uL (ref 3.87–5.11)
RDW: 13.9 % (ref 11.5–15.5)
WBC: 13.9 10*3/uL — ABNORMAL HIGH (ref 4.0–10.5)

## 2012-08-23 LAB — COMPREHENSIVE METABOLIC PANEL
AST: 27 U/L (ref 0–37)
AST: 45 U/L — ABNORMAL HIGH (ref 0–37)
Albumin: 2.9 g/dL — ABNORMAL LOW (ref 3.5–5.2)
BUN: 28 mg/dL — ABNORMAL HIGH (ref 6–23)
CO2: 24 mEq/L (ref 19–32)
Calcium: 8.9 mg/dL (ref 8.4–10.5)
Chloride: 103 mEq/L (ref 96–112)
Chloride: 103 mEq/L (ref 96–112)
Creatinine, Ser: 1.26 mg/dL — ABNORMAL HIGH (ref 0.50–1.10)
GFR calc non Af Amer: 38 mL/min — ABNORMAL LOW (ref >90)
Glucose, Bld: 99 mg/dL (ref 70–99)
Glucose, Bld: 123 mg/dL — ABNORMAL HIGH (ref 70–99)
Potassium: 4.7 mEq/L (ref 3.5–5.3)
Total Bilirubin: 0.7 mg/dL (ref 0.3–1.2)
Total Protein: 5.9 g/dL — ABNORMAL LOW (ref 6.0–8.3)

## 2012-08-23 LAB — PROTIME-INR: Prothrombin Time: 13.8 seconds (ref 11.6–15.2)

## 2012-08-23 LAB — CBC
MCH: 28.6 pg (ref 26.0–34.0)
MCHC: 33.2 g/dL (ref 30.0–36.0)
MCV: 86 fL (ref 78.0–100.0)
Platelets: 304 10*3/uL (ref 150–400)
RBC: 3.85 MIL/uL — ABNORMAL LOW (ref 3.87–5.11)

## 2012-08-23 LAB — POCT I-STAT TROPONIN I: Troponin i, poc: 0.02 ng/mL (ref 0.00–0.08)

## 2012-08-23 MED ORDER — ALBUTEROL SULFATE (5 MG/ML) 0.5% IN NEBU: 2.5000 mg | INHALATION_SOLUTION | RESPIRATORY_TRACT | Status: DC | PRN

## 2012-08-23 MED ORDER — ESTRADIOL 1 MG PO TABS
0.5000 mg | ORAL_TABLET | Freq: Every day | ORAL | Status: DC
  Administered 2012-08-23 – 2012-08-24 (×2): 0.5 mg via ORAL
  Filled 2012-08-23 (×3): qty 0.5

## 2012-08-23 MED ORDER — METHYLPREDNISOLONE SODIUM SUCC 40 MG IJ SOLR
40.0000 mg | Freq: Four times a day (QID) | INTRAMUSCULAR | Status: DC
  Administered 2012-08-23 – 2012-08-25 (×8): 40 mg via INTRAVENOUS
  Filled 2012-08-23 (×11): qty 1

## 2012-08-23 MED ORDER — POLYETHYL GLYCOL-PROPYL GLYCOL 0.4-0.3 % OP SOLN: 1.0000 [drp] | Freq: Three times a day (TID) | OPHTHALMIC | Status: DC | PRN

## 2012-08-23 MED ORDER — DEXTROSE 5 % IV SOLN
1.0000 g | INTRAVENOUS | Status: DC
  Administered 2012-08-23 – 2012-08-24 (×2): 1 g via INTRAVENOUS
  Filled 2012-08-23 (×3): qty 10

## 2012-08-23 MED ORDER — METHOCARBAMOL 500 MG PO TABS
500.0000 mg | ORAL_TABLET | Freq: Three times a day (TID) | ORAL | Status: DC | PRN
  Administered 2012-08-23 – 2012-08-24 (×2): 500 mg via ORAL
  Filled 2012-08-23 (×3): qty 1

## 2012-08-23 MED ORDER — POLYVINYL ALCOHOL 1.4 % OP SOLN
1.0000 [drp] | Freq: Three times a day (TID) | OPHTHALMIC | Status: DC | PRN
  Filled 2012-08-23: qty 15

## 2012-08-23 MED ORDER — PIPERACILLIN-TAZOBACTAM 3.375 G IVPB
3.3750 g | Freq: Once | INTRAVENOUS | Status: AC
  Administered 2012-08-23: 3.375 g via INTRAVENOUS
  Filled 2012-08-23: qty 50

## 2012-08-23 MED ORDER — OMEGA-3-ACID ETHYL ESTERS 1 G PO CAPS
1.0000 g | ORAL_CAPSULE | Freq: Every day | ORAL | Status: DC
  Administered 2012-08-24 – 2012-08-25 (×2): 1 g via ORAL
  Filled 2012-08-23 (×2): qty 1

## 2012-08-23 MED ORDER — GUAIFENESIN ER 600 MG PO TB12
1200.0000 mg | ORAL_TABLET | Freq: Two times a day (BID) | ORAL | Status: DC
  Administered 2012-08-23 – 2012-08-25 (×4): 1200 mg via ORAL
  Filled 2012-08-23 (×5): qty 2

## 2012-08-23 MED ORDER — BIOTENE DRY MOUTH MT LIQD
15.0000 mL | Freq: Two times a day (BID) | OROMUCOSAL | Status: DC
  Administered 2012-08-24 – 2012-08-25 (×3): 15 mL via OROMUCOSAL

## 2012-08-23 MED ORDER — SODIUM CHLORIDE 0.9 % IJ SOLN
3.0000 mL | Freq: Two times a day (BID) | INTRAMUSCULAR | Status: DC
  Administered 2012-08-24: 3 mL via INTRAVENOUS

## 2012-08-23 MED ORDER — TIOTROPIUM BROMIDE MONOHYDRATE 18 MCG IN CAPS
18.0000 ug | ORAL_CAPSULE | Freq: Every day | RESPIRATORY_TRACT | Status: DC
  Administered 2012-08-24: 18 ug via RESPIRATORY_TRACT
  Filled 2012-08-23: qty 5

## 2012-08-23 MED ORDER — ALBUTEROL SULFATE (5 MG/ML) 0.5% IN NEBU
5.0000 mg | INHALATION_SOLUTION | Freq: Once | RESPIRATORY_TRACT | Status: AC
  Administered 2012-08-23: 5 mg via RESPIRATORY_TRACT
  Filled 2012-08-23: qty 1

## 2012-08-23 MED ORDER — MEDROXYPROGESTERONE ACETATE 2.5 MG PO TABS
2.5000 mg | ORAL_TABLET | Freq: Every day | ORAL | Status: DC
  Administered 2012-08-24 – 2012-08-25 (×2): 2.5 mg via ORAL
  Filled 2012-08-23 (×2): qty 1

## 2012-08-23 MED ORDER — PANTOPRAZOLE SODIUM 40 MG PO TBEC
40.0000 mg | DELAYED_RELEASE_TABLET | Freq: Every day | ORAL | Status: DC
  Administered 2012-08-24 – 2012-08-25 (×2): 40 mg via ORAL
  Filled 2012-08-23 (×3): qty 1

## 2012-08-23 MED ORDER — VANCOMYCIN HCL IN DEXTROSE 1-5 GM/200ML-% IV SOLN
1000.0000 mg | Freq: Once | INTRAVENOUS | Status: AC
  Administered 2012-08-23: 1000 mg via INTRAVENOUS
  Filled 2012-08-23 (×2): qty 200

## 2012-08-23 MED ORDER — ACETAMINOPHEN 650 MG RE SUPP: 650.0000 mg | Freq: Four times a day (QID) | RECTAL | Status: DC | PRN

## 2012-08-23 MED ORDER — ACETAMINOPHEN 325 MG PO TABS: 650.0000 mg | ORAL_TABLET | Freq: Four times a day (QID) | ORAL | Status: DC | PRN

## 2012-08-23 MED ORDER — MORPHINE SULFATE 2 MG/ML IJ SOLN: 2.0000 mg | INTRAMUSCULAR | Status: DC | PRN

## 2012-08-23 MED ORDER — MAGNESIUM OXIDE 400 MG PO TABS
400.0000 mg | ORAL_TABLET | Freq: Two times a day (BID) | ORAL | Status: DC
  Administered 2012-08-23 – 2012-08-24 (×3): 400 mg via ORAL
  Filled 2012-08-23 (×5): qty 1

## 2012-08-23 MED ORDER — LEVOTHYROXINE SODIUM 112 MCG PO TABS
112.0000 ug | ORAL_TABLET | Freq: Every day | ORAL | Status: DC
  Administered 2012-08-24 – 2012-08-25 (×2): 112 ug via ORAL
  Filled 2012-08-23 (×3): qty 1

## 2012-08-23 MED ORDER — SILVER SULFADIAZINE 1 % EX CREA
1.0000 "application " | TOPICAL_CREAM | Freq: Every day | CUTANEOUS | Status: DC
  Administered 2012-08-23 – 2012-08-24 (×2): 1 via TOPICAL
  Filled 2012-08-23: qty 85

## 2012-08-23 MED ORDER — ONDANSETRON HCL 4 MG PO TABS: 4.0000 mg | ORAL_TABLET | Freq: Four times a day (QID) | ORAL | Status: DC | PRN

## 2012-08-23 MED ORDER — ASPIRIN 81 MG PO TABS
81.0000 mg | ORAL_TABLET | Freq: Every day | ORAL | Status: DC
Start: 2012-08-24 — End: 2012-08-24
  Filled 2012-08-23: qty 1

## 2012-08-23 MED ORDER — ALBUTEROL SULFATE (5 MG/ML) 0.5% IN NEBU
2.5000 mg | INHALATION_SOLUTION | Freq: Four times a day (QID) | RESPIRATORY_TRACT | Status: DC
  Administered 2012-08-23 – 2012-08-24 (×4): 2.5 mg via RESPIRATORY_TRACT
  Filled 2012-08-23 (×4): qty 0.5

## 2012-08-23 MED ORDER — VANCOMYCIN HCL 500 MG IV SOLR
500.0000 mg | Freq: Two times a day (BID) | INTRAVENOUS | Status: DC
  Administered 2012-08-24 – 2012-08-25 (×3): 500 mg via INTRAVENOUS
  Filled 2012-08-23 (×5): qty 500

## 2012-08-23 MED ORDER — SODIUM CHLORIDE 0.9 % IV SOLN: 250.0000 mL | INTRAVENOUS | Status: DC | PRN

## 2012-08-23 MED ORDER — TRAMADOL HCL 50 MG PO TABS
50.0000 mg | ORAL_TABLET | Freq: Four times a day (QID) | ORAL | Status: DC | PRN
  Administered 2012-08-24: 50 mg via ORAL
  Filled 2012-08-23: qty 1

## 2012-08-23 MED ORDER — SODIUM CHLORIDE 0.9 % IJ SOLN: 3.0000 mL | INTRAMUSCULAR | Status: DC | PRN

## 2012-08-23 MED ORDER — SODIUM CHLORIDE 0.9 % IV SOLN
INTRAVENOUS | Status: DC
  Administered 2012-08-23: 18:00:00 via INTRAVENOUS

## 2012-08-23 MED ORDER — HEPARIN SODIUM (PORCINE) 5000 UNIT/ML IJ SOLN
5000.0000 [IU] | Freq: Three times a day (TID) | INTRAMUSCULAR | Status: DC
  Administered 2012-08-23 – 2012-08-25 (×5): 5000 [IU] via SUBCUTANEOUS
  Filled 2012-08-23 (×8): qty 1

## 2012-08-23 MED ORDER — IRBESARTAN 150 MG PO TABS
150.0000 mg | ORAL_TABLET | Freq: Every day | ORAL | Status: DC
  Administered 2012-08-24 – 2012-08-25 (×2): 150 mg via ORAL
  Filled 2012-08-23 (×2): qty 1

## 2012-08-23 MED ORDER — DEXTROSE 5 % IV SOLN
500.0000 mg | INTRAVENOUS | Status: DC
  Administered 2012-08-23 – 2012-08-24 (×2): 500 mg via INTRAVENOUS
  Filled 2012-08-23 (×3): qty 500

## 2012-08-23 MED ORDER — SODIUM CHLORIDE 0.9 % IJ SOLN: 3.0000 mL | Freq: Two times a day (BID) | INTRAMUSCULAR | Status: DC

## 2012-08-23 MED ORDER — METHYLPREDNISOLONE SODIUM SUCC 125 MG IJ SOLR
125.0000 mg | Freq: Once | INTRAMUSCULAR | Status: AC
  Administered 2012-08-23: 125 mg via INTRAVENOUS
  Filled 2012-08-23: qty 2

## 2012-08-23 MED ORDER — CALCIUM CARBONATE 600 MG PO TABS
600.0000 mg | ORAL_TABLET | Freq: Every day | ORAL | Status: DC
  Filled 2012-08-23 (×2): qty 1

## 2012-08-23 MED ORDER — ONDANSETRON HCL 4 MG/2ML IJ SOLN
4.0000 mg | Freq: Four times a day (QID) | INTRAMUSCULAR | Status: DC | PRN
  Administered 2012-08-24: 4 mg via INTRAVENOUS
  Filled 2012-08-23: qty 2

## 2012-08-23 NOTE — ED Notes (Signed)
PT wast tx for double pnx by cardiologist last week.  She had labs drawn on Fri and was told to come to ED for decreased kidney function and elevated wbc's.  Pt on 2L o2 with sats of 98% and states that no increased sob, though she does feel sob with ambulation. Pt has completed antibiotics.

## 2012-08-23 NOTE — H&P (Signed)
PCP:   Michiel Sites, MD   Chief Complaint:  Persistent cough/Shortness of breath.   HPI: This is an 79 year old female, with known history of COPD, bronchial asthma, pulmonary fibrosis, on home O2, dyslipidemia,, hypothyroidism, HTN, GERD, Migraines, OA s/p back surgery, bilateral knee replacements and rotator cuff surgery, Liver cirrhosis, diverticulosis, chronic left foot wound, seen by Dr Zoila Shutter on 08/15/12, for worsening SOB, fevers up to 101, and chills. He arranged a chest CT scan, and on 08/16/12, called in a 5-day course of oral Avelox, which she was compliant with. Chest CT scan was done on 08/18/12, and showed multiple areas of airspace consolidation involving all lobes of both lungs, most likely due to multilobar pneumonia. Patient completed her antibiotics on 08/20/12, but because of persistent symptoms and worsening tenacious cough, she called Dr Blanchie Dessert office, and was referred to the ED today. Patient complains of bilateral lower rib cage pains on deep inspiration and coughing.    Allergies:  No Known Allergies    Past Medical History  Diagnosis Date  . COPD (chronic obstructive pulmonary disease)   . Hyperlipemia   . Heart murmur   . Shortness of breath     with exertion  . Asthma   . Pneumonia     hx of frequent episodes of pneumonia  . Headache(784.0)     MIgraine- Visualchanges- flashing lights and cant focus and cant see.  Marland Kitchen GERD (gastroesophageal reflux disease)   . Arthritis   . Diverticulitis 2008  . Pulmonary fibrosis   . Hypertension   . Cirrhosis of liver not due to alcohol   . Hypothyroidism     Past Surgical History  Procedure Laterality Date  . Eye surgery  05/2008    - bilateral  . Back surgery    . Total knee arthroplasty  11/2008  . Rotator cuff repair  2008    right  . Retinal tear repair cryotherapy    . Tonsillectomy    . Appendectomy    . Dilation and curettage of uterus    . Total knee arthroplasty  07/03/2011    Procedure:  TOTAL KNEE ARTHROPLASTY;  Surgeon: Valeria Batman, MD;  Location: [facility] OR;  Service: Orthopedics;  Laterality: Right;  . Transthoracic echocardiogram  04/12/2011    EF>55%; mod concentric LVH; mild-mod TR  . Varicose vein surgery      Prior to Admission medications   Medication Sig Start Date End Date Taking? Authorizing Provider  albuterol (PROVENTIL HFA;VENTOLIN HFA) 108 (90 BASE) MCG/ACT inhaler Inhale 2 puffs into the lungs every 6 (six) hours as needed. For shortness of breath   Yes Historical Provider, MD  aspirin 81 MG tablet Take 81 mg by mouth daily.   Yes Historical Provider, MD  calcium carbonate (OS-CAL) 600 MG TABS tablet Take 600 mg by mouth daily with breakfast.   Yes Historical Provider, MD  Cholecalciferol (VITAMIN D-3 PO) Take 2,000 Units by mouth daily.    Yes Historical Provider, MD  estradiol (ESTRACE) 0.5 MG tablet Take 0.5 mg by mouth at bedtime.   Yes Historical Provider, MD  Glucos-Chond-Sterol-Fish Oil (GLUCOSAMINE CHONDROITIN PLUS PO) Take 1 tablet by mouth daily.   Yes Historical Provider, MD  irbesartan (AVAPRO) 150 MG tablet Take 150 mg by mouth daily. 06/07/12  Yes Historical Provider, MD  levothyroxine (SYNTHROID, LEVOTHROID) 112 MCG tablet Take 112 mcg by mouth daily.   Yes Historical Provider, MD  magnesium oxide (MAG-OX) 400 MG tablet Take 400 mg by mouth  2 (two) times daily.    Yes Historical Provider, MD  medroxyPROGESTERone (PROVERA) 2.5 MG tablet Take 2.5 mg by mouth daily.   Yes Historical Provider, MD  meloxicam (MOBIC) 15 MG tablet Take 1 tablet by mouth daily. 05/26/12  Yes Historical Provider, MD  methocarbamol (ROBAXIN) 500 MG tablet Take 500 mg by mouth 3 (three) times daily as needed (for muscle spasms).    Yes Historical Provider, MD  Omega-3 Fatty Acids (FISH OIL) 1000 MG CAPS Take 1 capsule by mouth daily.   Yes Historical Provider, MD  omeprazole (PRILOSEC) 20 MG capsule Take 20 mg by mouth as needed. For heart burn   Yes Historical Provider, MD   Polyethyl Glycol-Propyl Glycol (SYSTANE OP) Place 1 drop into both eyes 3 (three) times daily as needed. For dry eyes   Yes Historical Provider, MD  silver sulfADIAZINE (SILVADENE) 1 % cream Apply 1 application topically daily. 06/18/12  Yes Historical Provider, MD  tiotropium (SPIRIVA) 18 MCG inhalation capsule Place 18 mcg into inhaler and inhale daily.   Yes Historical Provider, MD  traMADol (ULTRAM) 50 MG tablet Take 50 mg by mouth every 6 (six) hours as needed for pain.   Yes Historical Provider, MD    Social History: Patient reports that she has quit smoking. She has never used smokeless tobacco. She reports that she does not drink alcohol or use illicit drugs.  Family History  Problem Relation Age of Onset  . Multiple sclerosis Sister   . Diabetes Sister   . Heart disease Sister   . Heart attack Brother   . Stroke Maternal Grandmother   . Hypertension Sister   . Hyperlipidemia Sister   . Stroke Sister     Review of Systems:  As per HPI and chief complaint. Patent diminished appetite, headache, blurred vision, difficulty in speaking, dysphagia, orthopnea, paroxysmal nocturnal dyspnea, nausea, diaphoresis, abdominal pain, vomiting, diarrhea, belching, heartburn, hematemesis, melena, dysuria, nocturia, urinary frequency, hematochezia, lower extremity swelling, pain, or redness. Patient admits to.......the rest of the systems review is negative.  Physical Exam:  General:  Patient does not appear to be in obvious acute distress. Alert, communicative, fully oriented, talking in complete sentences, not short of breath at rest.  HEENT:  Mild clinical pallor, no jaundice, no conjunctival injection or discharge. Hydration status is fair.  NECK:  Supple, JVP not seen, no carotid bruits, no palpable lymphadenopathy, no palpable goiter. CHEST:  Bilateral basal and mid-zone crackles, no wheeze. HEART:  Sounds 1 and 2 heard, normal, regular, no murmurs. ABDOMEN:  Full, soft, non-tender, no  palpable organomegaly, no palpable masses, normal bowel sounds. GENITALIA:  Not examined. LOWER EXTREMITIES:  No pitting edema, palpable peripheral pulses. Patient has healing wound just below left lateral malleolus, with perifocal puffiness and mild tenderness.  MUSCULOSKELETAL SYSTEM:  Generalized osteoarthritic changes, otherwise, normal. CENTRAL NERVOUS SYSTEM:  No focal neurologic deficit on gross examination.  Labs on Admission:  Results for orders placed during the hospital encounter of 08/23/12 (from the past 48 hour(s))  CBC WITH DIFFERENTIAL     Status: Abnormal   Collection Time    08/23/12  1:55 PM      Result Value Range   WBC 13.9 (*) 4.0 - 10.5 K/uL   RBC 3.91  3.87 - 5.11 MIL/uL   Hemoglobin 11.3 (*) 12.0 - 15.0 g/dL   HCT 78.2 (*) 95.6 - 21.3 %   MCV 85.9  78.0 - 100.0 fL   MCH 28.9  26.0 - 34.0 pg  MCHC 33.6  30.0 - 36.0 g/dL   RDW 19.1  47.8 - 29.5 %   Platelets 308  150 - 400 K/uL   Neutrophils Relative % 85 (*) 43 - 77 %   Neutro Abs 11.7 (*) 1.7 - 7.7 K/uL   Lymphocytes Relative 6 (*) 12 - 46 %   Lymphs Abs 0.9  0.7 - 4.0 K/uL   Monocytes Relative 9  3 - 12 %   Monocytes Absolute 1.3 (*) 0.1 - 1.0 K/uL   Eosinophils Relative 0  0 - 5 %   Eosinophils Absolute 0.0  0.0 - 0.7 K/uL   Basophils Relative 0  0 - 1 %   Basophils Absolute 0.0  0.0 - 0.1 K/uL  COMPREHENSIVE METABOLIC PANEL     Status: Abnormal   Collection Time    08/23/12  1:55 PM      Result Value Range   Sodium 137  135 - 145 mEq/L   Potassium 4.3  3.5 - 5.1 mEq/L   Chloride 103  96 - 112 mEq/L   CO2 24  19 - 32 mEq/L   Glucose, Bld 123 (*) 70 - 99 mg/dL   BUN 28 (*) 6 - 23 mg/dL   Creatinine, Ser 6.21 (*) 0.50 - 1.10 mg/dL   Calcium 9.5  8.4 - 30.8 mg/dL   Total Protein 6.2  6.0 - 8.3 g/dL   Albumin 2.6 (*) 3.5 - 5.2 g/dL   AST 45 (*) 0 - 37 U/L   ALT 26  0 - 35 U/L   Alkaline Phosphatase 279 (*) 39 - 117 U/L   Total Bilirubin 0.7  0.3 - 1.2 mg/dL   GFR calc non Af Amer 38 (*) >90  mL/min   GFR calc Af Amer 44 (*) >90 mL/min   Comment: (NOTE)     The eGFR has been calculated using the CKD EPI equation.     This calculation has not been validated in all clinical situations.     eGFR's persistently <90 mL/min signify possible Chronic Kidney     Disease.  POCT I-STAT TROPONIN I     Status: None   Collection Time    08/23/12  2:05 PM      Result Value Range   Troponin i, poc 0.02  0.00 - 0.08 ng/mL   Comment 3            Comment: Due to the release kinetics of cTnI,     a negative result within the first hours     of the onset of symptoms does not rule out     myocardial infarction with certainty.     If myocardial infarction is still suspected,     repeat the test at appropriate intervals.  CG4 I-STAT (LACTIC ACID)     Status: None   Collection Time    08/23/12  2:06 PM      Result Value Range   Lactic Acid, Venous 1.06  0.5 - 2.2 mmol/L    Radiological Exams on Admission: Dg Chest 2 View (if Patient Has Fever And/or Copd)  08/23/2012   *RADIOLOGY REPORT*  Clinical Data: Cough, chest pain right worse than left, low grade fever, recent history of pneumonia  CHEST - 2 VIEW  Comparison: 07/01/2012  Findings: Stable scoliosis.  Heart size and vascular pattern normal.  Moderately severe diffuse interstitial change, stable. Round left upper lobe opacity measuring 2.8 cm, new from the prior study.  Hazy density superior segment left lower  lobe, new from the prior study.  2 cm right suprahilar opacity new from prior study.  IMPRESSION: Superimposed on severe chronic interstitial lung disease are bilateral focal areas of consolidation.  These are new from 07/01/2012, but extrapolate in the appearance to that of CT thorax performed 08/18/2012, essentially stable from the CT scan.  The findings are again most consistent with multi focal pneumonia. These should be followed to document complete resolution with appropriate therapy.   Original Report Authenticated By: Esperanza Heir,  M.D.    Assessment/Plan Active Problems:    1. CAP (community acquired pneumonia): Patient presented with progressive SOB, fever, chills and radiologically-confirmed multi-focal pneumonia, based on chest CT scan of 08/18/12 and CXR of 08/23/12. She has failed outpatient antibiotic therapy with Avelox, continues to feel unwell, and wcc is 13.9. We shall admit, manage with iv Vancomycin/Rocephin/Azithromycin, as well as supportive treatment, including parenteral steroids, bronchodilators, mucolytics and oxygen supplementation. 2.  2. Chronic respiratory failure: Patient has known chronic respiratory failure in the setting of obstructive airways disease and pulmonary fibrosis. She was commenced on home O2 in 06/2012, by her PMD, and over the past one month, she has experienced progressive SOB. Now worse, secondary to #1 above. Managing as described above, but it is clear that patient will benefit from a pulmonary consultation. Per Dr Blanchie Dessert office notes of 08/15/12, patient's Echocardiogram revealed a preserved EF. We shall check V/Q scan, as patient appears to have an elevated creatinine at this time.  3. COPD/Pulmonary Fibrosis: See discussion in #2 above.  4. GERD (gastroesophageal reflux disease): Asymptomatic.  5. Cirrhosis: Per EMR, patient has non-alcoholic cirrhosis. She has no clinical features of decompensation at this time. We shall check LFTs, albumin and coagulation profile.  6. Dyslipidemia: Continued on pre-admission lipid-lowering medication.   7. Hypothyroidism: We shall continue thyroxine replacement therapy.   8. HTN: Controlled.  9. Left foot wound: Patient has a healing wound on the dorsum of her left foot, just below the lateral malleolus. Will consult WOC. Per patient, she has an appointment already scheduled with the wound care clinic.   Further management will depend on clinical course.  Comment: have discussed Code Status with HPOA in the ED, and she confirmed that patient is  DNR/DNI.  Time Spent on Admission: 1 Hour.  Kyandre Okray,CHRISTOPHER 08/23/2012, 4:48 PM

## 2012-08-23 NOTE — ED Notes (Signed)
Patient transported to X-ray 

## 2012-08-23 NOTE — ED Provider Notes (Signed)
CSN: [id number]     Arrival date & time 08/23/12  1332 History     First MD Initiated Contact with Patient 08/23/12 1346     Chief Complaint  Patient presents with  . Pneumonia   (Consider location/radiation/quality/duration/timing/severity/associated sxs/prior Treatment) HPI Patient is an 79 year old female who presented to emergency department with complaint of shortness of breath and bilateral chest pain. Patient reports that she has had shortness of breath for over a month. Patient states in June she has seen her primary care doctor, Dr. Dorothe Pea, who has started her on home oxygen but did not tell her why. Patient does report history of COPD for which she takes multiple inhalers. The patient states she went to see Dr. Rennis Golden, cardiology for a nonhealing wound in her toe. Patient states while in the office Dr. Lennice Sites ordered a CT scan of her chest due to her symptoms and lung exam and was told she had bilateral pneumonia. Patient states she was started on Avelox for 5 days but she has finished 2 days ago. Patient states yesterday she had her blood rechecked and was called today and was told her white count is continuing to increase. Patient denies any improvement in her symptoms after the treatment with Avelox. Patient does state she has had increased weakness, weight loss, she has had intermittent fevers up to 101 at home. Last fever was 4 days ago. He should states she has been immobile at home for several days due to her weakness. Patient reports her symptoms do worsen on exertion. Patient denies any history of cardiac problems. Past Medical History  Diagnosis Date  . COPD (chronic obstructive pulmonary disease)   . Hyperlipemia   . Heart murmur   . Shortness of breath     with exertion  . Asthma   . Pneumonia     hx of frequent episodes of pneumonia  . Headache(784.0)     MIgraine- Visualchanges- flashing lights and cant focus and cant see.  Marland Kitchen GERD (gastroesophageal reflux disease)   .  Arthritis   . Diverticulitis 2008  . Pulmonary fibrosis   . Hypertension   . Cirrhosis of liver not due to alcohol   . Hypothyroidism    Past Surgical History  Procedure Laterality Date  . Eye surgery  05/2008    - bilateral  . Back surgery    . Total knee arthroplasty  11/2008  . Rotator cuff repair  2008    right  . Retinal tear repair cryotherapy    . Tonsillectomy    . Appendectomy    . Dilation and curettage of uterus    . Total knee arthroplasty  07/03/2011    Procedure: TOTAL KNEE ARTHROPLASTY;  Surgeon: Valeria Batman, MD;  Location: [facility] OR;  Service: Orthopedics;  Laterality: Right;  . Transthoracic echocardiogram  04/12/2011    EF>55%; mod concentric LVH; mild-mod TR  . Varicose vein surgery     Family History  Problem Relation Age of Onset  . Multiple sclerosis Sister   . Diabetes Sister   . Heart disease Sister   . Heart attack Brother   . Stroke Maternal Grandmother   . Hypertension Sister   . Hyperlipidemia Sister   . Stroke Sister    History  Substance Use Topics  . Smoking status: Former Smoker -- 1 years  . Smokeless tobacco: Never Used  . Alcohol Use: No   OB History   Grav Para Term Preterm Abortions TAB SAB Ect Mult Living  Review of Systems  Constitutional: Positive for fever, chills and fatigue.  Respiratory: Positive for cough, chest tightness and shortness of breath.   Cardiovascular: Positive for chest pain. Negative for palpitations and leg swelling.  Neurological: Positive for weakness.  All other systems reviewed and are negative.    Allergies  Review of patient's allergies indicates no known allergies.  Home Medications   Current Outpatient Rx  Name  Route  Sig  Dispense  Refill  . albuterol (PROVENTIL HFA;VENTOLIN HFA) 108 (90 BASE) MCG/ACT inhaler   Inhalation   Inhale 2 puffs into the lungs every 6 (six) hours as needed. For shortness of breath         . amLODipine (NORVASC) 10 MG tablet   Oral   Take  10 mg by mouth daily.         Marland Kitchen aspirin 81 MG tablet   Oral   Take 81 mg by mouth daily.         . Calcium 600-200 MG-UNIT per tablet   Oral   Take 1 tablet by mouth daily.         . Cholecalciferol (VITAMIN D-3 PO)   Oral   Take 2,000 Units by mouth daily.          Marland Kitchen estradiol (ESTRACE) 0.5 MG tablet   Oral   Take 0.5 mg by mouth at bedtime.         . Glucos-Chond-Sterol-Fish Oil (GLUCOSAMINE CHONDROITIN PLUS PO)   Oral   Take 1 tablet by mouth daily.         Marland Kitchen levothyroxine (SYNTHROID, LEVOTHROID) 112 MCG tablet   Oral   Take 112 mcg by mouth daily.         . magnesium oxide (MAG-OX) 400 MG tablet   Oral   Take 400 mg by mouth 2 (two) times daily.          . medroxyPROGESTERone (PROVERA) 2.5 MG tablet   Oral   Take 2.5 mg by mouth daily.         . meloxicam (MOBIC) 15 MG tablet   Oral   Take 1 tablet by mouth daily.         . methocarbamol (ROBAXIN) 500 MG tablet   Oral   Take 500 mg by mouth daily.         Marland Kitchen moxifloxacin (AVELOX) 400 MG tablet   Oral   Take 1 tablet (400 mg total) by mouth daily.   5 tablet   0   . Omega-3 Fatty Acids (FISH OIL) 1000 MG CAPS   Oral   Take 1 capsule by mouth daily.         Marland Kitchen omeprazole (PRILOSEC) 20 MG capsule   Oral   Take 20 mg by mouth as needed. For heart burn         . Polyethyl Glycol-Propyl Glycol (SYSTANE OP)   Both Eyes   Place 1 drop into both eyes 3 (three) times daily as needed. For dry eyes         . silver sulfADIAZINE (SILVADENE) 1 % cream   Topical   Apply 1 application topically daily.         Marland Kitchen tiotropium (SPIRIVA) 18 MCG inhalation capsule   Inhalation   Place 18 mcg into inhaler and inhale daily.         . traMADol (ULTRAM) 50 MG tablet   Oral   Take 50 mg by mouth every 6 (six) hours as needed for pain.         Marland Kitchen  triamterene-hydrochlorothiazide (DYAZIDE) 37.5-25 MG per capsule   Oral   Take 1 capsule by mouth daily.          BP 131/75  Pulse 96   Temp(Src) 98 F (36.7 C) (Oral)  Resp 20  SpO2 96% Physical Exam  Nursing note and vitals reviewed. Constitutional: She is oriented to person, place, and time. She appears well-developed and well-nourished. No distress.  HENT:  Head: Normocephalic.  Eyes: Conjunctivae are normal.  Neck: Neck supple.  Cardiovascular: Normal rate, regular rhythm and normal heart sounds.   Pulmonary/Chest: Effort normal. She has wheezes. She has rales.  Diffuse Rales bilaterally. Expiratory wheezes in the bilateral lower lung fields.  Abdominal: Soft. Bowel sounds are normal. She exhibits no distension. There is no tenderness. There is no rebound.  Musculoskeletal: She exhibits no edema.  Small 1 x 1 cm wound to the left lateral malleolus. Does not appear to be infected  Neurological: She is alert and oriented to person, place, and time.  Skin: Skin is warm and dry.    ED Course   Procedures (including critical care time)    Date: 08/23/2012  Rate: 87  Rhythm: normal sinus rhythm  QRS Axis: normal  Intervals: normal  ST/T Wave abnormalities: normal  Conduction Disutrbances:none  Narrative Interpretation:   Old EKG Reviewed: unchanged  Results for orders placed during the hospital encounter of 08/23/12  CBC WITH DIFFERENTIAL      Result Value Range   WBC 13.9 (*) 4.0 - 10.5 K/uL   RBC 3.91  3.87 - 5.11 MIL/uL   Hemoglobin 11.3 (*) 12.0 - 15.0 g/dL   HCT 16.1 (*) 09.6 - 04.5 %   MCV 85.9  78.0 - 100.0 fL   MCH 28.9  26.0 - 34.0 pg   MCHC 33.6  30.0 - 36.0 g/dL   RDW 40.9  81.1 - 91.4 %   Platelets 308  150 - 400 K/uL   Neutrophils Relative % 85 (*) 43 - 77 %   Neutro Abs 11.7 (*) 1.7 - 7.7 K/uL   Lymphocytes Relative 6 (*) 12 - 46 %   Lymphs Abs 0.9  0.7 - 4.0 K/uL   Monocytes Relative 9  3 - 12 %   Monocytes Absolute 1.3 (*) 0.1 - 1.0 K/uL   Eosinophils Relative 0  0 - 5 %   Eosinophils Absolute 0.0  0.0 - 0.7 K/uL   Basophils Relative 0  0 - 1 %   Basophils Absolute 0.0  0.0 -  0.1 K/uL  COMPREHENSIVE METABOLIC PANEL      Result Value Range   Sodium 137  135 - 145 mEq/L   Potassium 4.3  3.5 - 5.1 mEq/L   Chloride 103  96 - 112 mEq/L   CO2 24  19 - 32 mEq/L   Glucose, Bld 123 (*) 70 - 99 mg/dL   BUN 28 (*) 6 - 23 mg/dL   Creatinine, Ser 7.82 (*) 0.50 - 1.10 mg/dL   Calcium 9.5  8.4 - 95.6 mg/dL   Total Protein 6.2  6.0 - 8.3 g/dL   Albumin 2.6 (*) 3.5 - 5.2 g/dL   AST 45 (*) 0 - 37 U/L   ALT 26  0 - 35 U/L   Alkaline Phosphatase 279 (*) 39 - 117 U/L   Total Bilirubin 0.7  0.3 - 1.2 mg/dL   GFR calc non Af Amer 38 (*) >90 mL/min   GFR calc Af Amer 44 (*) >90 mL/min  CG4 I-STAT (  LACTIC ACID)      Result Value Range   Lactic Acid, Venous 1.06  0.5 - 2.2 mmol/L  POCT I-STAT TROPONIN I      Result Value Range   Troponin i, poc 0.02  0.00 - 0.08 ng/mL   Comment 3            Dg Chest 2 View (if Patient Has Fever And/or Copd)  08/23/2012   *RADIOLOGY REPORT*  Clinical Data: Cough, chest pain right worse than left, low grade fever, recent history of pneumonia  CHEST - 2 VIEW  Comparison: 07/01/2012  Findings: Stable scoliosis.  Heart size and vascular pattern normal.  Moderately severe diffuse interstitial change, stable. Round left upper lobe opacity measuring 2.8 cm, new from the prior study.  Hazy density superior segment left lower lobe, new from the prior study.  2 cm right suprahilar opacity new from prior study.  IMPRESSION: Superimposed on severe chronic interstitial lung disease are bilateral focal areas of consolidation.  These are new from 07/01/2012, but extrapolate in the appearance to that of CT thorax performed 08/18/2012, essentially stable from the CT scan.  The findings are again most consistent with multi focal pneumonia. These should be followed to document complete resolution with appropriate therapy.   Original Report Authenticated By: Esperanza Heir, M.D.   Ct Chest Wo Contrast  08/18/2012   *RADIOLOGY REPORT*  Clinical Data: Chronic shortness of  breath.  On oxygen therapy. Chest pain.  Fever and weight loss.  CT CHEST WITHOUT CONTRAST  Technique:  Multidetector CT imaging of the chest was performed following the standard protocol without IV contrast.  Comparison: Chest radiograph stating back to 07/03/2011.  Findings: Mediastinal lymph nodes measure up to 10 mm in the low right paratracheal station.  Hilar regions are difficult to definitively evaluate without IV contrast.  No axillary adenopathy. Atherosclerotic calcification of the arterial vasculature, including coronary arteries.  Heart size normal.  No pericardial effusion.  Esophagus is mildly dilated throughout most of its course.  Probable biapical pleural parenchymal scarring.  There is an underlying pulmonary parenchymal pattern of reticular coarsening, traction bronchiectasis and subpleural reticulation without clear zonal predominance.  Peribronchovascular nodularity predominates in the mid and lower lung zones.  Superimposed airspace consolidation involves all lobes of both lungs.  Areas of consolidation are predominantly peripheral, especially in the left lower lobe, where findings are worst.  No pleural fluid.  Airway is unremarkable.  Incidental imaging of the upper abdomen shows an irregular liver margin.  Marked degenerative changes in the spine.  No worrisome lytic or sclerotic lesions.  IMPRESSION:  1.  Multiple areas of airspace consolidation involving all lobes of both lungs, most likely due to multilobar pneumonia.  Peripheral wedge shaped appearance in the left lower lobe can be seen with pulmonary infarction.  If there is concern for pulmonary embolus, CTA chest with contrast recommended. 2.  Underlying pattern of pulmonary parenchymal fibrosis is somewhat nonspecific and may be post infectious or post inflammatory in etiology. 3.  Mid and lower lung zone predominant peribronchovascular nodularity may be chronic.  Difficult to exclude an acute infectious bronchiolitis. 4.   Cirrhosis.   Original Report Authenticated By: Leanna Battles, M.D.     1. Healthcare-associated pneumonia   2. Pulmonary fibrosis   3. Renal insufficiency     MDM  Patient in the emergency department with shortness of breath and multilobar pneumonia on CT scan that was done a week ago. Patient finished a course of Avelox for  5 days which did not improve her symptoms. Patient continues to complain of cough, shortness of breath, bilateral lower chest pains. Patient's vital signs in emergency department are normal with normal oxygen saturation on 2 L which she is on at home. Patient's exam is positive for depressive Rales and mild expiratory wheezing. Patient was giving anlbuterol treatment, Solu-Medrol. His chest x-ray today confirmed multifocal pneumonia superimposed on severe chronic interstitial lung disease. Patient was started on vancomycin and Zosyn due to failed outpatient treatment on Avelox and is afebrile, lactic acid is negative, she is not tachycardic or tachypneic, doubt sepsis. Based on the CT scan from 08/18/2012 there is possibility of pulmonary infarction which is concerning for a PE however given patient does have elevated white blood count, fevers or home, I do suspect this is most likely pneumonia. If continues to have symptoms despite antibiotics, CT imaging could be considered. I have discussed the patient with dry hospitalist who will admit her for IV antibiotics and further evaluation  Filed Vitals:   08/23/12 1336  BP: 131/75  Pulse: 96  Temp: 98 F (36.7 C)  TempSrc: Oral  Resp: 20  SpO2: 96%      Mckay Tegtmeyer A [place], PA-C 08/23/12 1601

## 2012-08-23 NOTE — Progress Notes (Signed)
ANTIBIOTIC CONSULT NOTE - INITIAL  Pharmacy Consult for vancomycin Indication: rule out pneumonia  No Known Allergies  Vital Signs: Temp: 98 F (36.7 C) (08/16 1336) Temp src: Oral (08/16 1336) BP: 131/75 mmHg (08/16 1336) Pulse Rate: 96 (08/16 1336) Intake/Output from previous day:   Intake/Output from this shift:    Labs:  Recent Labs  08/22/12 1419 08/22/12 1421 08/23/12 1355  WBC 14.0*  --  13.9*  HGB 10.7*  --  11.3*  PLT 376  --  308  CREATININE  --  1.18* 1.26*   The CrCl is unknown because both a height and weight (above a minimum accepted value) are required for this calculation. No results found for this basename: VANCOTROUGH, VANCOPEAK, VANCORANDOM, GENTTROUGH, GENTPEAK, GENTRANDOM, TOBRATROUGH, TOBRAPEAK, TOBRARND, AMIKACINPEAK, AMIKACINTROU, AMIKACIN,  in the last 72 hours   Microbiology: No results found for this or any previous visit (from the past 720 hour(s)).  Medical History: Past Medical History  Diagnosis Date  . COPD (chronic obstructive pulmonary disease)   . Hyperlipemia   . Heart murmur   . Shortness of breath     with exertion  . Asthma   . Pneumonia     hx of frequent episodes of pneumonia  . Headache(784.0)     MIgraine- Visualchanges- flashing lights and cant focus and cant see.  Marland Kitchen GERD (gastroesophageal reflux disease)   . Arthritis   . Diverticulitis 2008  . Pulmonary fibrosis   . Hypertension   . Cirrhosis of liver not due to alcohol   . Hypothyroidism    Assessment: 79 year old female with suspected pneumonia. No fevers noted, wbc is elevated at 14. Scr impaired at 1.26. Patient received zosyn in ED already. She was taking avelox pta x 5d with no relief in her symptoms. Abx broadened to vancomycin and zosyn.  Goal of Therapy:  Vancomycin trough level 15-20 mcg/ml  Plan:  Measure antibiotic drug levels at steady state Follow up culture results Vancomycin 1g IV x1 now then 500mg  q12 hours  Sheppard Coil PharmD.,  BCPS Clinical Pharmacist Pager [phone] 08/23/2012 4:26 PM

## 2012-08-23 NOTE — Telephone Encounter (Signed)
Spoke with [name] today (08/23/12 - 11:56 am) - she initially improved some with antibiotics, but is feeling worse. Labs shows an improvement in her dehydration off of dyazide, but her WBC count is still elevated. CT shows possible multilobar pneumonia. I encouraged her to go to the hospital as she will likely need IV antibiotics and a pulmonary evaluation.  -Dr. Octavio Graves

## 2012-08-24 DIAGNOSIS — J45909 Unspecified asthma, uncomplicated: Secondary | ICD-10-CM

## 2012-08-24 DIAGNOSIS — K7460 Unspecified cirrhosis of liver: Secondary | ICD-10-CM

## 2012-08-24 LAB — COMPREHENSIVE METABOLIC PANEL
Alkaline Phosphatase: 215 U/L — ABNORMAL HIGH (ref 39–117)
BUN: 26 mg/dL — ABNORMAL HIGH (ref 6–23)
CO2: 24 mEq/L (ref 19–32)
Chloride: 103 mEq/L (ref 96–112)
Creatinine, Ser: 1 mg/dL (ref 0.50–1.10)
GFR calc Af Amer: 59 mL/min — ABNORMAL LOW (ref >90)
GFR calc non Af Amer: 51 mL/min — ABNORMAL LOW (ref >90)
Glucose, Bld: 183 mg/dL — ABNORMAL HIGH (ref 70–99)
Potassium: 4.2 mEq/L (ref 3.5–5.1)
Total Bilirubin: 0.3 mg/dL (ref 0.3–1.2)

## 2012-08-24 LAB — CBC
HCT: 29.7 % — ABNORMAL LOW (ref 36.0–46.0)
Hemoglobin: 9.8 g/dL — ABNORMAL LOW (ref 12.0–15.0)
MCV: 85.6 fL (ref 78.0–100.0)
WBC: 7.4 10*3/uL (ref 4.0–10.5)

## 2012-08-24 LAB — TSH: TSH: 0.203 u[IU]/mL — ABNORMAL LOW (ref 0.350–4.500)

## 2012-08-24 MED ORDER — CALCIUM CARBONATE 1250 (500 CA) MG PO TABS
1.0000 | ORAL_TABLET | Freq: Every day | ORAL | Status: DC
  Administered 2012-08-24 – 2012-08-25 (×2): 500 mg via ORAL
  Filled 2012-08-24 (×4): qty 1

## 2012-08-24 MED ORDER — ASPIRIN EC 81 MG PO TBEC
81.0000 mg | DELAYED_RELEASE_TABLET | Freq: Every day | ORAL | Status: DC
  Administered 2012-08-24 – 2012-08-25 (×2): 81 mg via ORAL
  Filled 2012-08-24 (×2): qty 1

## 2012-08-24 NOTE — Consult Note (Signed)
WOC consult Note Reason for Consult:chronic partial thickness ulcer at left lateral malleolus.  Blood filled blister at left great toe (LGT). Two family members present at time of assessment today. Patient is in excellent spirits. Wound type:venous insufficiency and trauma (LGT) Pressure Ulcer POA: No Measurement:Left lateral malleolus:  1cm round area of dry skin with 0.2cm round center that is recently reepithelialized (no depth).  LGT with blood-filled blister at nail bed absent of nail. Wound AVW:[id number] above. Drainage (amount, consistency, odor) None Periwound:intact, clear.  No evidence of hemosiderin staining in pretibial area. Dressing procedure/placement/frequency: I will continue the use of the silicone dressing applied at admission as both family members present today remark how much the wound has improved since admission.  Elevation is the likely assistant and we will continue foot elevation to prevent a heel pressure ulcer while in house and LE elevation when OOB in the chair.  The LGT is with an intact blood-filled blister and rather than open it prematurely, I will implement twice daily saline dressings to draw and perhaps reduce this while in house.  Follow up with the PCP should ensue pose discharge. WOC Nursing team will not follow, but will remain available to this aptient, her family and the medical and nursing teams.  Please re-consult if needed. Thanks, Ladona Mow, MSN, RN, [facility], CWOCN [phone])

## 2012-08-24 NOTE — ED Provider Notes (Signed)
Medical screening examination/treatment/procedure(s) were conducted as a shared visit with non-physician practitioner(s) and myself.  I personally evaluated the patient during the encounter Pt with persistent SOB and chest pain despite outpatient treatment. Will admit for pneumonia failing outpt treatment.   Loren Racer, MD 08/24/12 [phone]

## 2012-08-24 NOTE — Progress Notes (Signed)
[facility] PROGRESS NOTE  [name] VHQ:[id number] DOB: 1927/08/29 DOA: 08/23/2012 PCP: Michiel Sites, MD  Assessment/Plan: CAP -Agree with current abx of vanc/rocephin/azithro as she failed OP avelox.  Acute on Chronic Respiratory Failure -Is on her baseline oxygen of 2 L with good O2 sats. -2/2 CAP on top of pulmonary fibrosis and COPD. -Since we have good reasons for her SOB, will cancel VQ that was ordered. -Consider pulmonary consult if does not improve. -Continue current steroid dose as I suspect this is helping with her pulmonary fibrosis.  Hypothyroidism -Continue synthroid.  Left Foot Wound -As per wound care consultation.  HTN -Fair control. -Continue current regimen.  Hyperlipidemia -Continue lovaza.  Cirrhosis -Non-alcoholic. -Compensated   Code Status: DNR Family Communication: Patient only  Disposition Plan: Home when medically ready.   Consultants:  None   Antibiotics:  Vancomycin  Rocephin  Azithromycin   Subjective: Feels that SOB has improved.  Objective: Filed Vitals:   08/23/12 2134 08/23/12 2137 08/24/12 0448 08/24/12 0917  BP: 136/57  163/78   Pulse: 86 86 67   Temp: 98.1 F (36.7 C)  98.3 F (36.8 C)   TempSrc: Oral  Oral   Resp: 22 22 20    Height:      Weight:      SpO2: 96% 96% 96% 97%   No intake or output data in the 24 hours ending 08/24/12 1346 Filed Weights   08/23/12 1723  Weight: 56.6 kg (124 lb 12.5 oz)    Exam:   General:  AA Ox3, NAD  Cardiovascular: RRR  Respiratory: Bilateral dry crackles  Abdomen: S/NT/ND/+BS  Extremities: 1+ edema bilaterally   Neurologic:  Grossly intact and non-focal.  Data Reviewed: Basic Metabolic Panel:  Recent Labs Lab 08/22/12 1421 08/23/12 1355 08/23/12 1731 08/24/12 0518  NA 138 137  --  137  K 4.7 4.3  --  4.2  CL 103 103  --  103  CO2 24 24  --  24  GLUCOSE 99 123*  --  183*  BUN 28* 28*  --  26*  CREATININE 1.18* 1.26* 1.13*  1.00  CALCIUM 8.9 9.5  --  9.0   Liver Function Tests:  Recent Labs Lab 08/22/12 1421 08/23/12 1355 08/24/12 0518  AST 27 45* 19  ALT 22 26 19   ALKPHOS 239* 279* 215*  BILITOT 0.8 0.7 0.3  PROT 5.9* 6.2 5.8*  ALBUMIN 2.9* 2.6* 2.1*   No results found for this basename: LIPASE, AMYLASE,  in the last 168 hours No results found for this basename: AMMONIA,  in the last 168 hours CBC:  Recent Labs Lab 08/22/12 1419 08/23/12 1355 08/23/12 1731 08/24/12 0518  WBC 14.0* 13.9* 13.8* 7.4  NEUTROABS 11.2* 11.7*  --   --   HGB 10.7* 11.3* 11.0* 9.8*  HCT 33.6* 33.6* 33.1* 29.7*  MCV 85.7 85.9 86.0 85.6  PLT 376 308 304 259   Cardiac Enzymes: No results found for this basename: CKTOTAL, CKMB, CKMBINDEX, TROPONINI,  in the last 168 hours BNP (last 3 results) No results found for this basename: PROBNP,  in the last 8760 hours CBG:  Recent Labs Lab 08/24/12 0752  GLUCAP 153*    No results found for this or any previous visit (from the past 240 hour(s)).   Studies: Dg Chest 2 View (if Patient Has Fever And/or Copd)  08/23/2012   *RADIOLOGY REPORT*  Clinical Data: Cough, chest pain right worse than left, low grade fever, recent history of pneumonia  CHEST -  2 VIEW  Comparison: 07/01/2012  Findings: Stable scoliosis.  Heart size and vascular pattern normal.  Moderately severe diffuse interstitial change, stable. Round left upper lobe opacity measuring 2.8 cm, new from the prior study.  Hazy density superior segment left lower lobe, new from the prior study.  2 cm right suprahilar opacity new from prior study.  IMPRESSION: Superimposed on severe chronic interstitial lung disease are bilateral focal areas of consolidation.  These are new from 07/01/2012, but extrapolate in the appearance to that of CT thorax performed 08/18/2012, essentially stable from the CT scan.  The findings are again most consistent with multi focal pneumonia. These should be followed to document complete  resolution with appropriate therapy.   Original Report Authenticated By: Esperanza Heir, M.D.    Scheduled Meds: . albuterol  2.5 mg Nebulization Q6H  . antiseptic oral rinse  15 mL Mouth Rinse BID  . aspirin EC  81 mg Oral Daily  . azithromycin  500 mg Intravenous Q24H  . calcium carbonate  1 tablet Oral Q breakfast  . cefTRIAXone (ROCEPHIN)  IV  1 g Intravenous Q24H  . estradiol  0.5 mg Oral QHS  . guaiFENesin  1,200 mg Oral BID  . heparin  5,000 Units Subcutaneous Q8H  . irbesartan  150 mg Oral Daily  . levothyroxine  112 mcg Oral Q breakfast  . magnesium oxide  400 mg Oral BID  . medroxyPROGESTERone  2.5 mg Oral Daily  . methylPREDNISolone (SOLU-MEDROL) injection  40 mg Intravenous Q6H  . omega-3 acid ethyl esters  1 g Oral Daily  . pantoprazole  40 mg Oral Daily  . silver sulfADIAZINE  1 application Topical Daily  . sodium chloride  3 mL Intravenous Q12H  . sodium chloride  3 mL Intravenous Q12H  . tiotropium  18 mcg Inhalation Daily  . vancomycin  500 mg Intravenous Q12H   Continuous Infusions: . sodium chloride 50 mL/hr at 08/23/12 1751    Active Problems:   CAP (community acquired pneumonia)   COPD (chronic obstructive pulmonary disease)   Chronic respiratory failure   GERD (gastroesophageal reflux disease)   Cirrhosis    Time spent: 40 minutes.    Chaya Jan  [facility] Pager [phone]  If 7PM-7AM, please contact night-coverage at [email or web address], password [facility] 08/24/2012, 1:46 PM  LOS: 1 day

## 2012-08-25 LAB — BASIC METABOLIC PANEL
BUN: 31 mg/dL — ABNORMAL HIGH (ref 6–23)
CO2: 25 mEq/L (ref 19–32)
Chloride: 110 mEq/L (ref 96–112)
GFR calc Af Amer: 53 mL/min — ABNORMAL LOW (ref >90)
Potassium: 4.6 mEq/L (ref 3.5–5.1)

## 2012-08-25 LAB — CBC
HCT: 28.5 % — ABNORMAL LOW (ref 36.0–46.0)
Hemoglobin: 9.3 g/dL — ABNORMAL LOW (ref 12.0–15.0)
MCHC: 32.6 g/dL (ref 30.0–36.0)
Platelets: 296 10*3/uL (ref 150–400)
RDW: 13.9 % (ref 11.5–15.5)
WBC: 16.8 10*3/uL — ABNORMAL HIGH (ref 4.0–10.5)

## 2012-08-25 LAB — GLUCOSE, CAPILLARY: Glucose-Capillary: 158 mg/dL — ABNORMAL HIGH (ref 70–99)

## 2012-08-25 MED ORDER — ALBUTEROL SULFATE (5 MG/ML) 0.5% IN NEBU: 2.5000 mg | INHALATION_SOLUTION | Freq: Four times a day (QID) | RESPIRATORY_TRACT | Status: DC | PRN

## 2012-08-25 MED ORDER — ALBUTEROL SULFATE (5 MG/ML) 0.5% IN NEBU
2.5000 mg | INHALATION_SOLUTION | Freq: Two times a day (BID) | RESPIRATORY_TRACT | Status: DC
  Filled 2012-08-25: qty 0.5

## 2012-08-25 MED ORDER — LEVOFLOXACIN 750 MG PO TABS: 750.0000 mg | ORAL_TABLET | Freq: Every day | ORAL | Status: DC

## 2012-08-25 MED ORDER — ACETAMINOPHEN 325 MG PO TABS: 650.0000 mg | ORAL_TABLET | Freq: Four times a day (QID) | ORAL | Status: DC | PRN

## 2012-08-25 MED ORDER — PREDNISONE (PAK) 10 MG PO TABS: 60.0000 mg | ORAL_TABLET | Freq: Every day | ORAL | Status: DC

## 2012-08-25 MED ORDER — GUAIFENESIN ER 600 MG PO TB12: 1200.0000 mg | ORAL_TABLET | Freq: Two times a day (BID) | ORAL | Status: DC

## 2012-08-25 NOTE — Care Management Note (Signed)
    Page 1 of 2   08/25/2012     12:59:49 PM   CARE MANAGEMENT NOTE 08/25/2012  Patient:  [name], [name]   Account Number:  [id number]  Date Initiated:  08/25/2012  Documentation initiated by:  Letha Cape  Subjective/Objective Assessment:   dx cap  admit- lives alone.     Action/Plan:   pt eval- rec hhpt  pt has rolling walker at home and a rollator.   Anticipated DC Date:  08/25/2012   Anticipated DC Plan:  HOME W HOME HEALTH SERVICES      DC Planning Services  CM consult      [facility] Choice  HOME HEALTH   Choice offered to / List presented to:  C-1 Patient        HH arranged  HH-2 PT  HH-1 RN      [facility] agency  [facility]   Status of service:  Completed, signed off Medicare Important Message given?   (If response is "NO", the following Medicare IM given date fields will be blank) Date Medicare IM given:   Date Additional Medicare IM given:    Discharge Disposition:  HOME W HOME HEALTH SERVICES  Per UR Regulation:  Reviewed for med. necessity/level of care/duration of stay  If discussed at Long Length of Stay Meetings, dates discussed:    Comments:  08/25/12 12:56 Letha Cape RN, BSN [phone] Patient lives alone, patient for dc today, per physical therapy recs hhpt, and she will also resp care,which a HHRN will do.  will add HHRN.  Patient chose [facility] .  Referral made to [facility], Debbie notified.  Soc will begin 24-48 hrs post discharge.  Patient would like for me to find her another PCP, I will do this and put in dc information.

## 2012-08-25 NOTE — Progress Notes (Signed)
Nsg Discharge Note  Admit Date:  08/23/2012 Discharge date: 08/25/2012   [name] to be D/C'd Home per MD order.  AVS completed.  Copy for chart, and copy for patient signed, and dated. Patient/caregiver able to verbalize understanding.  Discharge Medication:   Medication List    STOP taking these medications       silver sulfADIAZINE 1 % cream  Commonly known as:  SILVADENE      TAKE these medications       acetaminophen 325 MG tablet  Commonly known as:  TYLENOL  Take 2 tablets (650 mg total) by mouth every 6 (six) hours as needed.     albuterol (5 MG/ML) 0.5% nebulizer solution  Commonly known as:  PROVENTIL  Take 0.5 mL (2.5 mg total) by nebulization every 6 (six) hours as needed for wheezing.     albuterol 108 (90 BASE) MCG/ACT inhaler  Commonly known as:  PROVENTIL HFA;VENTOLIN HFA  Inhale 2 puffs into the lungs every 6 (six) hours as needed. For shortness of breath     aspirin 81 MG tablet  Take 81 mg by mouth daily.     calcium carbonate 600 MG Tabs tablet  Commonly known as:  OS-CAL  Take 600 mg by mouth daily with breakfast.     estradiol 0.5 MG tablet  Commonly known as:  ESTRACE  Take 0.5 mg by mouth at bedtime.     Fish Oil 1000 MG Caps  Take 1 capsule by mouth daily.     GLUCOSAMINE CHONDROITIN PLUS PO  Take 1 tablet by mouth daily.     guaiFENesin 600 MG 12 hr tablet  Commonly known as:  MUCINEX  Take 2 tablets (1,200 mg total) by mouth 2 (two) times daily.     irbesartan 150 MG tablet  Commonly known as:  AVAPRO  Take 150 mg by mouth daily.     levofloxacin 750 MG tablet  Commonly known as:  LEVAQUIN  Take 1 tablet (750 mg total) by mouth daily.     levothyroxine 112 MCG tablet  Commonly known as:  SYNTHROID, LEVOTHROID  Take 112 mcg by mouth daily.     magnesium oxide 400 MG tablet  Commonly known as:  MAG-OX  Take 400 mg by mouth 2 (two) times daily.     medroxyPROGESTERone 2.5 MG tablet  Commonly known as:  PROVERA  Take  2.5 mg by mouth daily.     meloxicam 15 MG tablet  Commonly known as:  MOBIC  Take 1 tablet by mouth daily.     methocarbamol 500 MG tablet  Commonly known as:  ROBAXIN  Take 500 mg by mouth 3 (three) times daily as needed (for muscle spasms).     omeprazole 20 MG capsule  Commonly known as:  PRILOSEC  Take 20 mg by mouth as needed. For heart burn     predniSONE 10 MG tablet  Commonly known as:  STERAPRED UNI-PAK  - Take 6 tablets (60 mg total) by mouth daily. Take 6 tablets with breakfast for 3 days.  (8/19 - 8/21)  - Take 5 tablets with breakfast for 3 days (8/22- 8/24)  - Take 4 tablets with breakfast for 3 days (8/25 - 8/27)  - Take 3 tablets with breakfast for 3 days (8/28 - 8/30)  - Take 2 tablets with breakfast for 3 days (8/31 - 9/2)  - Take 1 tablet with breakfast for 3 days and then stop.     SYSTANE OP  Place 1 drop into both eyes 3 (three) times daily as needed. For dry eyes     tiotropium 18 MCG inhalation capsule  Commonly known as:  SPIRIVA  Place 18 mcg into inhaler and inhale daily.     traMADol 50 MG tablet  Commonly known as:  ULTRAM  Take 50 mg by mouth every 6 (six) hours as needed for pain.     VITAMIN D-3 PO  Take 2,000 Units by mouth daily.        Discharge Assessment: Filed Vitals:   08/25/12 1354  BP: 173/77  Pulse: 74  Temp: 98.2 F (36.8 C)  Resp: 20   Skin has black left great toe and wound on L ankle both with intact dressings. IV catheter discontinued intact. Site without signs and symptoms of complications - no redness or edema noted at insertion site, patient denies c/o pain - only slight tenderness at site.  Dressing with slight pressure applied.  D/c Instructions-Education: Discharge instructions given to patient/family with verbalized understanding. D/c education completed with patient/family including follow up instructions, medication list, d/c activities limitations if indicated, with other d/c instructions as indicated  by MD - patient able to verbalize understanding, all questions fully answered. Patient instructed to return to ED, call 911, or call MD for any changes in condition.  Patient escorted via WC, and D/C home via private auto.  Tracia Lacomb Consuella Lose, RN 08/25/2012 6:14 PM

## 2012-08-25 NOTE — Progress Notes (Signed)
Patient while ambulating in hall 150 feet 02 sat ranged from 93-94%. Returned to room 02 sat was 94%.

## 2012-08-25 NOTE — Evaluation (Signed)
Physical Therapy Evaluation Patient Details Name: [name] MRN: [id number] DOB: 12-11-1927 Today's Date: 08/25/2012 Time: 4098-1191 PT Time Calculation (min): 24 min  PT Assessment / Plan / Recommendation History of Present Illness  pt presents with recent fall and rib pain, now with CAP.    Clinical Impression  Pt notes family has offered to let pt stay with them, but pt declines to go anywhere but home.  Encouraged pt to use cane and to have family come to see her daily to A with home care.      PT Assessment  Patient needs continued PT services    Follow Up Recommendations  Home health PT;Supervision - Intermittent    Does the patient have the potential to tolerate intense rehabilitation      Barriers to Discharge        Equipment Recommendations  None recommended by PT    Recommendations for Other Services     Frequency Min 3X/week    Precautions / Restrictions Precautions Precautions: Fall Restrictions Weight Bearing Restrictions: No   Pertinent Vitals/Pain Denies pain.  O2 sats 93-94% on 2L O2 during ambulation.        Mobility  Bed Mobility Bed Mobility: Not assessed Transfers Transfers: Sit to Stand;Stand to Sit Sit to Stand: 5: Supervision;With upper extremity assist;From bed Stand to Sit: 5: Supervision;With upper extremity assist;To bed Details for Transfer Assistance: demos good use of UEs, but moves slowly Ambulation/Gait Ambulation/Gait Assistance: 4: Min guard Ambulation Distance (Feet): 150 Feet Assistive device: None Ambulation/Gait Assistance Details: pt unsteady and with occasional side step to catch balance, especially when turning head to converse with PT.  Encouraged pt to use cane inside and not just outside.   Gait Pattern: Step-through pattern;Decreased stride length Stairs: No Wheelchair Mobility Wheelchair Mobility: No    Exercises     PT Diagnosis: Difficulty walking  PT Problem List: Decreased strength;Decreased activity  tolerance;Decreased balance;Decreased mobility;Decreased knowledge of use of DME PT Treatment Interventions: DME instruction;Gait training;Stair training;Functional mobility training;Therapeutic activities;Therapeutic exercise;Balance training;Patient/family education     PT Goals(Current goals can be found in the care plan section) Acute Rehab PT Goals Patient Stated Goal: Home PT Goal Formulation: With patient Time For Goal Achievement: 09/01/12 Potential to Achieve Goals: Good  Visit Information  Last PT Received On: 08/25/12 Assistance Needed: +1 History of Present Illness: pt presents with recent fall and rib pain, now with CAP.         Prior Functioning  Home Living Family/patient expects to be discharged to:: Private residence Living Arrangements: Alone Available Help at Discharge: Family;Available PRN/intermittently (Family check on pt daily.  ) Type of Home: House Home Access: Stairs to enter [employer] of Steps: 2 Home Layout: Two level;Able to live on main level with bedroom/bathroom Home Equipment: Gilmer Mor - single point;Cane - quad Prior Function Level of Independence: Independent with assistive device(s) (Cane when outside) Communication Communication: No difficulties    Cognition  Cognition Arousal/Alertness: Awake/alert Behavior During Therapy: WFL for tasks assessed/performed Overall Cognitive Status: Within Functional Limits for tasks assessed    Extremity/Trunk Assessment Upper Extremity Assessment Upper Extremity Assessment: Overall WFL for tasks assessed Lower Extremity Assessment Lower Extremity Assessment: Generalized weakness   Balance Balance Balance Assessed: Yes High Level Balance High Level Balance Activites: Turns;Head turns;Direction changes  End of Session PT - End of Session Equipment Utilized During Treatment: Gait belt Activity Tolerance: Patient tolerated treatment well Patient left: in bed;with call bell/phone within reach  (Sitting EOB) Nurse Communication: Mobility  status  GP     Sunny Schlein, [place] [phone] 08/25/2012, 1:19 PM

## 2012-08-25 NOTE — Discharge Summary (Signed)
Physician Discharge Summary  [name] WUJ:[id number] DOB: [birth year] DOA: 08/23/2012  PCP: Michiel Sites, MD  Admit date: 08/23/2012 Discharge date: 08/25/2012  Time spent: 50 minutes  Recommendations for Outpatient Follow-up:   Follow up with PCP: Left ankle ulceration and left great toe blood blister, decreased TSH, and normocytic anemia.  Follow up with Dr. Sherene Sires of Pulmonology on 8/29 regarding recent pneumonia, chronic pulmonary fibrosis and COPD.  On two liters of oxygen 24/7.  Home Health physical therapy recommended and ordered. Home Health respiratory therapy ordered for education and assistance with nebulizer machine.   Discharge Diagnoses:  Active Problems:   CAP (community acquired pneumonia)   COPD (chronic obstructive pulmonary disease)   Chronic respiratory failure   GERD (gastroesophageal reflux disease)   Cirrhosis   Discharge Condition: much improved.  Stable  Diet recommendation: regular diet as tolerated.  Filed Weights   08/23/12 1723  Weight: 56.6 kg (124 lb 12.5 oz)    History of present illness at the time of admission:  This is an 79 year old female, with known history of COPD, bronchial asthma, pulmonary fibrosis, on home O2, dyslipidemia,, hypothyroidism, HTN, GERD, Migraines, OA s/p back surgery, bilateral knee replacements and rotator cuff surgery, Liver cirrhosis, diverticulosis, chronic left foot wound, seen by Dr Zoila Shutter on 08/15/12, for worsening SOB, fevers up to 101, and chills. He arranged a chest CT scan, and on 08/16/12, called in a 5-day course of oral Avelox, which she was compliant with. Chest CT scan was done on 08/18/12, and showed multiple areas of airspace consolidation involving all lobes of both lungs, most likely due to multilobar pneumonia. Patient completed her antibiotics on 08/20/12, but because of persistent symptoms and worsening tenacious cough, she called Dr Blanchie Dessert office, and was referred to the ED today. Patient  complains of bilateral lower rib cage pains on deep inspiration and coughing.    Hospital Course:   CAP  Patient was treated with triple abx (vanc/rocephin/azithro) from 8/16 - 8/18. She improved quickly.  At the time of discharge she is ambulating on her normal 2 liters of oxygen maintaining oxygen sats of 94%.  It is felt that her quick recovery is more due to steroid therapy than antibiotic therapy.  She will be discharged on Levaquin for 5 more days.    Acute on Chronic Respiratory Failure  Is on her baseline oxygen of 2 L with good O2 sats.  Secondary to CAP on top of pulmonary fibrosis and COPD.  Patient improved significantly on solumedrol, nebulizers, and spiriva.   She will be discharged on a slow prednisone taper in addition to spiriva and PRN nebulizers Pulmonary follow up has been scheduled for pulmonary fibrosis.  Hypothyroidism  Continue synthroid.  Will ask PCP to follow and evaluate outpatient as her TSH was low.  Left Foot Wound  Wound Care Consultation recommended stopping silver nitrate and continuing silicone dressing with lower extremity elevation.  Hypertension. Fair control.  Continue current regimen.   Hyperlipidemia  Continue lovaza.   Cirrhosis  Non-alcoholic.  Compensated   Discharge Exam: Filed Vitals:   08/25/12 0444  BP: 150/71  Pulse: 77  Temp: 98.6 F (37 C)  Resp: 18    General: AA Ox3, NAD, sitting in chair, elderly, thin, frail. Cardiovascular: RRR Respiratory: CTA, no w/c/r, on oxygen therapy at 2L Abdomen: S/NT/ND/+BS  Extremities: 1+ edema bilaterally  Neurologic: Grossly intact and non-focal. Skin:  No rash or bruise, LLE ulceration.   Discharge Instructions  Discharge Orders   Future Appointments Provider Department Dept Phone   09/02/2012 1:30 PM Chrystie Nose, MD [facility] [place] [phone]   09/05/2012 11:30 AM Nyoka Cowden, MD [facility] [phone]   Future  Orders Complete By Expires   Diet general  As directed    DME Nebulizer machine  As directed    Increase activity slowly  As directed        Medication List    STOP taking these medications       silver sulfADIAZINE 1 % cream  Commonly known as:  SILVADENE      TAKE these medications       acetaminophen 325 MG tablet  Commonly known as:  TYLENOL  Take 2 tablets (650 mg total) by mouth every 6 (six) hours as needed.     albuterol (5 MG/ML) 0.5% nebulizer solution  Commonly known as:  PROVENTIL  Take 0.5 mL (2.5 mg total) by nebulization every 6 (six) hours as needed for wheezing.     albuterol 108 (90 BASE) MCG/ACT inhaler  Commonly known as:  PROVENTIL HFA;VENTOLIN HFA  Inhale 2 puffs into the lungs every 6 (six) hours as needed. For shortness of breath     aspirin 81 MG tablet  Take 81 mg by mouth daily.     calcium carbonate 600 MG Tabs tablet  Commonly known as:  OS-CAL  Take 600 mg by mouth daily with breakfast.     estradiol 0.5 MG tablet  Commonly known as:  ESTRACE  Take 0.5 mg by mouth at bedtime.     Fish Oil 1000 MG Caps  Take 1 capsule by mouth daily.     GLUCOSAMINE CHONDROITIN PLUS PO  Take 1 tablet by mouth daily.     guaiFENesin 600 MG 12 hr tablet  Commonly known as:  MUCINEX  Take 2 tablets (1,200 mg total) by mouth 2 (two) times daily.     irbesartan 150 MG tablet  Commonly known as:  AVAPRO  Take 150 mg by mouth daily.     levofloxacin 750 MG tablet  Commonly known as:  LEVAQUIN  Take 1 tablet (750 mg total) by mouth daily.     levothyroxine 112 MCG tablet  Commonly known as:  SYNTHROID, LEVOTHROID  Take 112 mcg by mouth daily.     magnesium oxide 400 MG tablet  Commonly known as:  MAG-OX  Take 400 mg by mouth 2 (two) times daily.     medroxyPROGESTERone 2.5 MG tablet  Commonly known as:  PROVERA  Take 2.5 mg by mouth daily.     meloxicam 15 MG tablet  Commonly known as:  MOBIC  Take 1 tablet by mouth daily.      methocarbamol 500 MG tablet  Commonly known as:  ROBAXIN  Take 500 mg by mouth 3 (three) times daily as needed (for muscle spasms).     omeprazole 20 MG capsule  Commonly known as:  PRILOSEC  Take 20 mg by mouth as needed. For heart burn     predniSONE 10 MG tablet  Commonly known as:  STERAPRED UNI-PAK  - Take 6 tablets (60 mg total) by mouth daily. Take 6 tablets with breakfast for 3 days.  (8/19 - 8/21)  - Take 5 tablets with breakfast for 3 days (8/22- 8/24)  - Take 4 tablets with breakfast for 3 days (8/25 - 8/27)  - Take 3 tablets with breakfast for 3 days (8/28 - 8/30)  -  Take 2 tablets with breakfast for 3 days (8/31 - 9/2)  - Take 1 tablet with breakfast for 3 days and then stop.     SYSTANE OP  Place 1 drop into both eyes 3 (three) times daily as needed. For dry eyes     tiotropium 18 MCG inhalation capsule  Commonly known as:  SPIRIVA  Place 18 mcg into inhaler and inhale daily.     traMADol 50 MG tablet  Commonly known as:  ULTRAM  Take 50 mg by mouth every 6 (six) hours as needed for pain.     VITAMIN D-3 PO  Take 2,000 Units by mouth daily.       No Known Allergies Follow-up Information   Follow up with Michiel Sites, MD In 1 week.   Specialty:  Endocrinology   Contact information:   [address] [place] [address] [phone]       Follow up with [facility], NP On 08/25/2012.   Specialty:  Nurse Practitioner   Contact information:   [address] [place] [address] [phone]        The results of significant diagnostics from this hospitalization (including imaging, microbiology, ancillary and laboratory) are listed below for reference.    Significant Diagnostic Studies: Dg Chest 2 View (if Patient Has Fever And/or Copd)  08/23/2012   *RADIOLOGY REPORT*  Clinical Data: Cough, chest pain right worse than left, low grade fever, recent history of pneumonia  CHEST - 2 VIEW  Comparison: 07/01/2012   Findings: Stable scoliosis.  Heart size and vascular pattern normal.  Moderately severe diffuse interstitial change, stable. Round left upper lobe opacity measuring 2.8 cm, new from the prior study.  Hazy density superior segment left lower lobe, new from the prior study.  2 cm right suprahilar opacity new from prior study.  IMPRESSION: Superimposed on severe chronic interstitial lung disease are bilateral focal areas of consolidation.  These are new from 07/01/2012, but extrapolate in the appearance to that of CT thorax performed 08/18/2012, essentially stable from the CT scan.  The findings are again most consistent with multi focal pneumonia. These should be followed to document complete resolution with appropriate therapy.   Original Report Authenticated By: Esperanza Heir, M.D.   Ct Chest Wo Contrast  08/18/2012   *RADIOLOGY REPORT*  Clinical Data: Chronic shortness of breath.  On oxygen therapy. Chest pain.  Fever and weight loss.  CT CHEST WITHOUT CONTRAST  Technique:  Multidetector CT imaging of the chest was performed following the standard protocol without IV contrast.  Comparison: Chest radiograph stating back to 07/03/2011.  Findings: Mediastinal lymph nodes measure up to 10 mm in the low right paratracheal station.  Hilar regions are difficult to definitively evaluate without IV contrast.  No axillary adenopathy. Atherosclerotic calcification of the arterial vasculature, including coronary arteries.  Heart size normal.  No pericardial effusion.  Esophagus is mildly dilated throughout most of its course.  Probable biapical pleural parenchymal scarring.  There is an underlying pulmonary parenchymal pattern of reticular coarsening, traction bronchiectasis and subpleural reticulation without clear zonal predominance.  Peribronchovascular nodularity predominates in the mid and lower lung zones.  Superimposed airspace consolidation involves all lobes of both lungs.  Areas of consolidation are predominantly  peripheral, especially in the left lower lobe, where findings are worst.  No pleural fluid.  Airway is unremarkable.  Incidental imaging of the upper abdomen shows an irregular liver margin.  Marked degenerative changes in the spine.  No worrisome lytic  or sclerotic lesions.  IMPRESSION:  1.  Multiple areas of airspace consolidation involving all lobes of both lungs, most likely due to multilobar pneumonia.  Peripheral wedge shaped appearance in the left lower lobe can be seen with pulmonary infarction.  If there is concern for pulmonary embolus, CTA chest with contrast recommended. 2.  Underlying pattern of pulmonary parenchymal fibrosis is somewhat nonspecific and may be post infectious or post inflammatory in etiology. 3.  Mid and lower lung zone predominant peribronchovascular nodularity may be chronic.  Difficult to exclude an acute infectious bronchiolitis. 4.  Cirrhosis.   Original Report Authenticated By: Leanna Battles, M.D.     Labs: Basic Metabolic Panel:  Recent Labs Lab 08/22/12 1421 08/23/12 1355 08/23/12 1731 08/24/12 0518 08/25/12 0535  NA 138 137  --  137 143  K 4.7 4.3  --  4.2 4.6  CL 103 103  --  103 110  CO2 24 24  --  24 25  GLUCOSE 99 123*  --  183* 170*  BUN 28* 28*  --  26* 31*  CREATININE 1.18* 1.26* 1.13* 1.00 1.09  CALCIUM 8.9 9.5  --  9.0 9.2   Liver Function Tests:  Recent Labs Lab 08/22/12 1421 08/23/12 1355 08/24/12 0518  AST 27 45* 19  ALT 22 26 19   ALKPHOS 239* 279* 215*  BILITOT 0.8 0.7 0.3  PROT 5.9* 6.2 5.8*  ALBUMIN 2.9* 2.6* 2.1*   CBC:  Recent Labs Lab 08/22/12 1419 08/23/12 1355 08/23/12 1731 08/24/12 0518 08/25/12 0535  WBC 14.0* 13.9* 13.8* 7.4 16.8*  NEUTROABS 11.2* 11.7*  --   --   --   HGB 10.7* 11.3* 11.0* 9.8* 9.3*  HCT 33.6* 33.6* 33.1* 29.7* 28.5*  MCV 85.7 85.9 86.0 85.6 85.8  PLT 376 308 304 259 296   CBG:  Recent Labs Lab 08/24/12 0752 08/25/12 0801  GLUCAP 153* 158*       Signed:  Conley Canal [phone]  [facility] 08/25/2012, 1:19 PM  Patient seen and examined. Will DC home today on levaquin and a prednisone taper.  Peggye Pitt, MD [facility] Pager: [phone]

## 2012-09-01 ENCOUNTER — Encounter (HOSPITAL_BASED_OUTPATIENT_CLINIC_OR_DEPARTMENT_OTHER): Payer: Medicare Other

## 2012-09-02 ENCOUNTER — Encounter: Payer: Self-pay | Admitting: Internal Medicine

## 2012-09-02 ENCOUNTER — Ambulatory Visit (INDEPENDENT_AMBULATORY_CARE_PROVIDER_SITE_OTHER): Payer: Medicare Other | Admitting: Internal Medicine

## 2012-09-02 VITALS — BP 146/78 | Ht 64.0 in | Wt 124.8 lb

## 2012-09-02 DIAGNOSIS — J8410 Pulmonary fibrosis, unspecified: Secondary | ICD-10-CM

## 2012-09-02 DIAGNOSIS — J189 Pneumonia, unspecified organism: Secondary | ICD-10-CM

## 2012-09-02 DIAGNOSIS — I10 Essential (primary) hypertension: Secondary | ICD-10-CM

## 2012-09-02 NOTE — Patient Instructions (Addendum)
Your physician wants you to follow-up in:  6 months. You will receive a reminder letter in the mail two months in advance. If you don't receive a letter, please call our office to schedule the follow-up appointment.   

## 2012-09-02 NOTE — Progress Notes (Signed)
OFFICE NOTE  Chief Complaint:  Routine followup  Primary Care Physician: Michiel Sites, MD  HPI:  [name]  is an 79 year old female with a history of knee surgery on the right, low back surgery, hypertension, hypothyroidism, mild dyslipidemia and GERD. She was low risk for surgery, had an echo which showed a normal EF, but has had right lower extremity swelling. She reports she thought she had prior stripping to the left greater saphenous vein graft in the past. She underwent arterial and venous Dopplers of the legs on December 11, 2011 which showed a slightly reduced ABI on the right with an increased velocity at the popliteal artery suggesting 50% reduction, however, normal ABI on the left. The venous Dopplers indicated no evidence of venous insufficiency and all of the visualized vessels were present, including both the left and right greater saphenous veins, suggesting that if they had performed prior venous stripping that perhaps she had an accessory vessel removed but it was not the greater saphenous vein. Overall there is no clear evidence to support venous ablation or other treatment.   Her main complaint today is worsening shortness of breath. She is known to have COPD and pulmonary fibrosis. Recently, perhaps over the past few weeks, she has had worsening shortness of breath ultimately requiring home oxygen. She tells me that her O2 saturations were less than 80% in the office. She was not treated with antibiotics per her report nor given steroids. I do not see a chest x-ray since 2013. At that time a CT scan was recommended to further delineate the extent of her pulmonary fibrosis. She reports that she is having ongoing fevers and chills. Her temperature actually was 100.1 last evening and her MAXIMUM TEMPERATURE was 101.9. She is not coughing up any sputum but has significant bilateral pleuritic chest pain which is worse in the axillary areas. She is markedly fatigued and  appears in mild distress.  Her laboratory work demonstrated a leukocytosis with a left shift. And the CT scan demonstrated multilobular pneumonia superimposed on lumbar fibrosis. Based on these findings I recommended starting her on Avelox which she took for 5 days. She initially had small improvement in her symptoms, but did not get substantively better. I did recommend holding her diuretic and this resulted in improved renal function. At that point I recommended she present to the hospital as she was felt to fail outpatient antibiotic therapy. She was admitted and spent several days in the hospital receiving broad-spectrum IV antibiotics with a good response. She was discharged and returns for followup today with some improvement in her breathing and energy level. She does report a small amount of bilateral lower extremity edema which is dependent, probably related to the fact that we stopped her diuretic. We may need to consider restarting this.  PMHx:  Past Medical History  Diagnosis Date  . COPD (chronic obstructive pulmonary disease)   . Hyperlipemia   . Heart murmur   . Shortness of breath     with exertion  . Asthma   . Pneumonia     hx of frequent episodes of pneumonia  . Headache(784.0)     MIgraine- Visualchanges- flashing lights and cant focus and cant see.  Marland Kitchen GERD (gastroesophageal reflux disease)   . Arthritis   . Diverticulitis 2008  . Pulmonary fibrosis   . Hypertension   . Cirrhosis of liver not due to alcohol   . Hypothyroidism     Past Surgical History  Procedure Laterality Date  .  Eye surgery  05/2008    - bilateral  . Back surgery    . Total knee arthroplasty  11/2008  . Rotator cuff repair  2008    right  . Retinal tear repair cryotherapy    . Tonsillectomy    . Appendectomy    . Dilation and curettage of uterus    . Total knee arthroplasty  07/03/2011    Procedure: TOTAL KNEE ARTHROPLASTY;  Surgeon: Valeria Batman, MD;  Location: [facility] OR;  Service:  Orthopedics;  Laterality: Right;  . Transthoracic echocardiogram  04/12/2011    EF>55%; mod concentric LVH; mild-mod TR  . Varicose vein surgery      FAMHx:  Family History  Problem Relation Age of Onset  . Multiple sclerosis Sister   . Diabetes Sister   . Heart disease Sister   . Heart attack Brother   . Stroke Maternal Grandmother   . Hypertension Sister   . Hyperlipidemia Sister   . Stroke Sister     SOCHx:   reports that she quit smoking about 65 years ago. She has never used smokeless tobacco. She reports that she does not drink alcohol or use illicit drugs.  ALLERGIES:  No Known Allergies  ROS: A comprehensive review of systems was negative except for: Constitutional: positive for chills, fatigue, fevers and malaise Respiratory: positive for dyspnea on exertion, emphysema and pleurisy/chest pain  HOME MEDS: Current Outpatient Prescriptions  Medication Sig Dispense Refill  . acetaminophen (TYLENOL) 325 MG tablet Take 2 tablets (650 mg total) by mouth every 6 (six) hours as needed.      Marland Kitchen albuterol (PROVENTIL HFA;VENTOLIN HFA) 108 (90 BASE) MCG/ACT inhaler Inhale 2 puffs into the lungs every 6 (six) hours as needed. For shortness of breath      . albuterol (PROVENTIL) (5 MG/ML) 0.5% nebulizer solution Take 0.5 mL (2.5 mg total) by nebulization every 6 (six) hours as needed for wheezing.  20 mL  12  . aspirin 81 MG tablet Take 81 mg by mouth daily.      . calcium carbonate (OS-CAL) 600 MG TABS tablet Take 600 mg by mouth daily with breakfast.      . Cholecalciferol (VITAMIN D-3 PO) Take 2,000 Units by mouth daily.       Marland Kitchen estradiol (ESTRACE) 0.5 MG tablet Take 0.5 mg by mouth at bedtime.      . Glucos-Chond-Sterol-Fish Oil (GLUCOSAMINE CHONDROITIN PLUS PO) Take 1 tablet by mouth daily.      Marland Kitchen guaiFENesin (MUCINEX) 600 MG 12 hr tablet Take 2 tablets (1,200 mg total) by mouth 2 (two) times daily.      . irbesartan (AVAPRO) 150 MG tablet Take 150 mg by mouth daily.      Marland Kitchen  levothyroxine (SYNTHROID, LEVOTHROID) 112 MCG tablet Take 112 mcg by mouth daily.      . magnesium oxide (MAG-OX) 400 MG tablet Take 400 mg by mouth 2 (two) times daily.       . medroxyPROGESTERone (PROVERA) 2.5 MG tablet Take 2.5 mg by mouth daily.      . meloxicam (MOBIC) 15 MG tablet Take 1 tablet by mouth daily.      . methocarbamol (ROBAXIN) 500 MG tablet Take 500 mg by mouth 3 (three) times daily as needed (for muscle spasms).       . Omega-3 Fatty Acids (FISH OIL) 1000 MG CAPS Take 1 capsule by mouth daily.      Marland Kitchen omeprazole (PRILOSEC) 20 MG capsule Take 20 mg by mouth  as needed. For heart burn      . Polyethyl Glycol-Propyl Glycol (SYSTANE OP) Place 1 drop into both eyes 3 (three) times daily as needed. For dry eyes      . predniSONE (STERAPRED UNI-PAK) 10 MG tablet Take 6 tablets (60 mg total) by mouth daily. Take 6 tablets with breakfast for 3 days.  (8/19 - 8/21) Take 5 tablets with breakfast for 3 days (8/22- 8/24) Take 4 tablets with breakfast for 3 days (8/25 - 8/27) Take 3 tablets with breakfast for 3 days (8/28 - 8/30) Take 2 tablets with breakfast for 3 days (8/31 - 9/2) Take 1 tablet with breakfast for 3 days and then stop.  63 tablet  0  . tiotropium (SPIRIVA) 18 MCG inhalation capsule Place 18 mcg into inhaler and inhale daily.      . traMADol (ULTRAM) 50 MG tablet Take 50 mg by mouth every 6 (six) hours as needed for pain.       No current facility-administered medications for this visit.    LABS/IMAGING: No results found for this or any previous visit (from the past 48 hour(s)). No results found.  VITALS: BP 146/78  Ht 5\' 4"  (1.626 m)  Wt 124 lb 12.8 oz (56.609 kg)  BMI 21.41 kg/m2  EXAM: General appearance: alert and mild distress Neck: no adenopathy, no carotid bruit, no JVD, supple, symmetrical, trachea midline and thyroid not enlarged, symmetric, no tenderness/mass/nodules Lungs: diminished breath sounds bibasilar, dry crackles bilaterally, no  egophony Heart: regular rate and rhythm, S1, S2 normal, no murmur, click, rub or gallop Abdomen: soft, non-tender; bowel sounds normal; no masses,  no organomegaly Extremities: extremities normal, atraumatic, no cyanosis or edema Pulses: 2+ and symmetric Skin: Skin color, texture, turgor normal. No rashes or lesions Neurologic: Grossly normal  EKG: deferred  ASSESSMENT: 1. COPD and pulmonary fibrosis - s/p hospitalization and IV antibiotics for pneumonia  PLAN: 1.   [name] has improved from her pneumonia. She still has exam findings of pulmonary fibrosis and is on 2 L continuous oxygen. She has an appointment with Dr. Sherene Sires with [place] [facility] in a few days. She may need to be restarted on a low-dose diuretic if she's having worsening problems with lower extremity edema. Her renal function has normalized. We'll plan to see her back in 6 months.  Chrystie Nose, MD, [facility] Attending Cardiologist [facility]  HILTY,Kenneth C 09/02/2012, 1:52 PM

## 2012-09-05 ENCOUNTER — Encounter: Payer: Self-pay | Admitting: Internal Medicine

## 2012-09-05 ENCOUNTER — Ambulatory Visit (INDEPENDENT_AMBULATORY_CARE_PROVIDER_SITE_OTHER): Payer: Medicare Other | Admitting: Internal Medicine

## 2012-09-05 VITALS — BP 122/60 | HR 83 | Temp 98.1°F | Ht 64.0 in | Wt 125.6 lb

## 2012-09-05 DIAGNOSIS — J45909 Unspecified asthma, uncomplicated: Secondary | ICD-10-CM

## 2012-09-05 DIAGNOSIS — J9610 Chronic respiratory failure, unspecified whether with hypoxia or hypercapnia: Secondary | ICD-10-CM

## 2012-09-05 DIAGNOSIS — J8410 Pulmonary fibrosis, unspecified: Secondary | ICD-10-CM

## 2012-09-05 MED ORDER — OMEPRAZOLE 20 MG PO CPDR: DELAYED_RELEASE_CAPSULE | ORAL | Status: DC

## 2012-09-05 NOTE — Progress Notes (Signed)
Subjective:     Patient ID: [name], female   DOB: August 11, 1927   MRN: [id number]  HPI  Primary is [name] with [name]   61 yowf never sign smoker followed by [name] remotely with h/o recurrent pna as infant and extending into adulthood as "freq bronchitis" then middle age seemed less freq, less sever  and then wose x 2011/12 but never on 02 24/7 until p hosp for an illness that started in May 2014 and resulted in hosp at [facility] with the following d/c summary :    Admit date: 08/23/2012  Discharge date: 08/25/2012  Recommendations for Outpatient Follow-up:  Follow up with PCP: Left ankle ulceration and left great toe blood blister, decreased TSH, and normocytic anemia.  Follow up with Diana. Sherene Bauer of Pulmonology on 8/29 regarding recent pneumonia, chronic pulmonary fibrosis and COPD. On two liters of oxygen 24/7.  Home Health physical therapy recommended and ordered.  Home Health respiratory therapy ordered for education and assistance with nebulizer machine.  Discharge Diagnoses:  Active Problems:  CAP (community acquired pneumonia)  COPD (chronic obstructive pulmonary disease)  Chronic respiratory failure  GERD (gastroesophageal reflux disease)  Cirrhosis  Discharge Condition: much improved. Stable  Diet recommendation: regular diet as tolerated.  Filed Weights    08/23/12 1723   Weight:  56.6 kg (124 lb 12.5 oz)   History of present illness at the time of admission:  This is an 79 year old female, with known history of COPD, bronchial asthma, pulmonary fibrosis, on home O2, dyslipidemia,, hypothyroidism, HTN, GERD, Migraines, OA s/p back surgery, bilateral knee replacements and rotator cuff surgery, Liver cirrhosis, diverticulosis, chronic left foot wound, seen by [name] on 08/15/12, for worsening SOB, fevers up to 101, and chills. He arranged a chest CT scan, and on 08/16/12, called in a 5-day course of oral Avelox, which she was compliant with. Chest CT scan was done on 08/18/12,  and showed multiple areas of airspace consolidation involving all lobes of both lungs, most likely due to multilobar pneumonia. Patient completed her antibiotics on 08/20/12, but because of persistent symptoms and worsening tenacious cough, she called Diana [name] office, and was referred to the ED today. Patient complains of bilateral lower rib cage pains on deep inspiration and coughing.  Hospital Course:  CAP  Patient was treated with triple abx (vanc/rocephin/azithro) from 8/16 - 8/18.  She improved quickly. At the time of discharge she is ambulating on   2 liters of oxygen maintaining oxygen sats of 94%. It is felt that her quick recovery is more due to steroid therapy than antibiotic therapy. She will be discharged on Levaquin for 5 more days.  Acute on Chronic Respiratory Failure  She will be discharged on a slow prednisone taper in addition to spiriva and PRN nebulizers  Pulmonary follow up has been scheduled for pulmonary fibrosis  09/05/2012 f/u ov/[name] re PF Chief Complaint  Patient presents with  . HFU    Pt states that her breathing is unchanged since hospital d/c. No new co's today.    doe x road and back on RA sat 90 on return, hoarse chronically with dry mouth and eyes, worse dry mouth on spiriva.  No obvious daytime variabilty or assoc chronic cough or cp or chest tightness, subjective wheeze overt sinus or hb symptoms. No unusual exp hx or h/o childhood asthma or knowledge of premature birth.   Sleeping ok without nocturnal  or early am exacerbation  of respiratory  c/o's or need  for noct saba. Also denies any obvious fluctuation of symptoms with weather or environmental changes or other aggravating or alleviating factors except as outlined above   Current Medications, Allergies, Past Medical History, Past Surgical History, Family History, and Social History were reviewed in [name] record.  ROS  The following are not active complaints unless  bolded sore throat, dysphagia, dental problems, itching, sneezing,  nasal congestion or excess/ purulent secretions, ear ache,   fever, chills, sweats, unintended wt loss, pleuritic or exertional cp, hemoptysis,  orthopnea pnd or leg swelling, presyncope, palpitations, heartburn, abdominal pain, anorexia, nausea, vomiting, diarrhea  or change in bowel or urinary habits, change in stools or urine, dysuria,hematuria,  rash, arthralgias, visual complaints, headache, numbness weakness or ataxia or problems with walking or coordination,  change in mood/affect or memory.           Review of Systems     Objective:   Physical Exam     Chronically ill amb wf nad  Wt Readings from Last 3 Encounters:  09/05/12 125 lb 9.6 oz (56.972 kg)  09/02/12 124 lb 12.8 oz (56.609 kg)  08/23/12 124 lb 12.5 oz (56.6 kg)     HEENT: nl dentition, turbinates, and orophanx. Nl external ear canals without cough reflex   NECK :  without JVD/Nodes/TM/ nl carotid upstrokes bilaterally   LUNGS: no acc muscle use,  A few insp crackles post bilat, a few exp rhonchi as well    CV:  RRR  no s3 or murmur or increase in P2, no edema   ABD:  soft and nontender with nl excursion in the supine position. No bruits or organomegaly, bowel sounds nl  MS:  warm without deformities, calf tenderness, cyanosis or clubbing  SKIN: warm and dry without lesions    NEURO:  alert, approp, no deficits    cxr 08/23/12 Superimposed on severe chronic interstitial lung disease are  bilateral focal areas of consolidation. These are new from  07/01/2012, but extrapolate in the appearance to that of CT thorax  performed 08/18/2012, essentially stable from the CT scan  Assessment:

## 2012-09-05 NOTE — Patient Instructions (Addendum)
Stop the fish oil and spiriva  I recommend you come off the hormone therapy  Increase prilosec to Take 30- 60 min before your first and last meals of the day   Goal of oxygen is keeping your oxygen levels above 90%   GERD (REFLUX)  is an extremely common cause of respiratory symptoms just like yours , many times with no significant heartburn at all.    It can be treated with medication, but also with lifestyle changes including avoidance of late meals, excessive alcohol, smoking cessation, and avoid fatty foods, chocolate, peppermint, colas, red wine, and acidic juices such as orange juice.  NO MINT OR MENTHOL PRODUCTS SO NO COUGH DROPS  USE SUGARLESS CANDY INSTEAD (jolley ranchers or Stover's)  NO OIL BASED VITAMINS - use powdered substitutes.    Please schedule a follow up office visit in 4 weeks, sooner if needed with pfts and cxr

## 2012-09-06 NOTE — Assessment & Plan Note (Signed)
No evidence copd > d/c spiriva

## 2012-09-06 NOTE — Assessment & Plan Note (Addendum)
Longstanding and only began to have worsening sympotms and now recurrent "pna" in the last few years which makes me strongly suspicious of asp/ gerd syndromes at age 79  Use of PPI is associated with improved survival time and with decreased radiologic fibrosis per King's study published in [facility] vol 184 p1390.  Dec 2011  This may not be cause and effect, but given how universally unhelpful all the otherstudy drugs have been for pf,   rec start  rx ppi / diet/ lifestyle modification.    This should include avoiding oil supplements, reviewed  Should also consider d/c progesterone for same reason.

## 2012-09-06 NOTE — Assessment & Plan Note (Signed)
Adequate control on present rx, reviewed > no change in rx needed  > ok to adjust 02 to sats > 90 x should wear it at bedtime

## 2012-09-19 ENCOUNTER — Telehealth: Payer: Self-pay | Admitting: Internal Medicine

## 2012-09-19 NOTE — Telephone Encounter (Signed)
I spoke with pt. She is wanting to know if MW feels like she is okay to fly to [place]. She stated due to her recent hospitalization. Please advise MW thanks  --she is fine with a call back monday

## 2012-09-20 NOTE — Telephone Encounter (Signed)
Ok to travel but will need to work with her [employer] on travel arrangements

## 2012-09-22 NOTE — Telephone Encounter (Signed)
ATC patient no answer [id number] 

## 2012-09-22 NOTE — Telephone Encounter (Signed)
I called and spoke with pt made her aware. Nothing further needed

## 2012-09-22 NOTE — Telephone Encounter (Signed)
Returning call.[name] ° °

## 2012-09-26 ENCOUNTER — Telehealth: Payer: Self-pay | Admitting: Internal Medicine

## 2012-09-26 DIAGNOSIS — S81802A Unspecified open wound, left lower leg, initial encounter: Secondary | ICD-10-CM

## 2012-09-26 NOTE — Telephone Encounter (Signed)
Ok to use it prn swelling but not "daily/ automatically"

## 2012-09-26 NOTE — Telephone Encounter (Signed)
Dr. Rennis Golden took patient off of her fluid pill---legs started to swell so she took it yesterday---should she continue to take?    Medication is generic Maxide.

## 2012-09-26 NOTE — Telephone Encounter (Signed)
Pt advised. Emberly Tomasso, CMA  

## 2012-09-26 NOTE — Telephone Encounter (Signed)
Called, spoke with pt.  Pt states maxide was d/c'd from "one of the new doctors" after she was sent home from the hospital.  Pt states she noticed swelling in her legs yesterday so she restarted this.  The swelling has now came down with the maxide.  She is wanting to know if it would be ok to restart this given it was d/c'd after she was sent home from the hospital.  She is unsure who stopped this medication.  Pt believes it was Dr. Sherene Sires or Dr. Rennis Golden, and states her PCP office has already closed for the day.  Dyazide is on pt's past history med list and was d/c'd in the hospital.  She is requesting call back prior to the weekend.  Dr. Sherene Sires, can you give pt some guidance for the weekend?  Thank you.

## 2012-09-29 NOTE — Telephone Encounter (Signed)
Please call-still having problem with her leg.

## 2012-09-29 NOTE — Telephone Encounter (Signed)
Returned call.  Pt c/o L leg/ankle swelling.  Also c/o wounds on L ankle and one of her toes.  Stated she was being seen by Wound Care, but that stopped last week.  C/o int pain in L leg from ankle to leg.  Was on triamterene-hctz 37.5-25 mg which was dc'd when she was discharged from the hospital (Aug 2014).  Pt wants to know what to do about swelling.  RN concerned about wounds pt stated she has w/ swelling.  Pt stated toe wound was white and slimy over the weekend and both the ankle and toe are red.  Pt not taking abxs.  Informed Dr. Rennis Golden will be notified for further instructions.  Pt verbalized understanding and agreed w/ plan.  Dr. Rennis Golden notified and will review chart for further instructions.  Message forwarded to Dr. Rennis Golden.

## 2012-09-30 NOTE — Telephone Encounter (Signed)
I would refer her to the [facility] wound care center.  She can restart taking the combination diuretic for her swelling.  -Dr. Rennis Golden

## 2012-09-30 NOTE — Telephone Encounter (Signed)
Returned call and informed pt per instructions by MD/PA.  Pt verbalized understanding and agreed w/ plan.  Pt stated she will be out this morning for another doctor's appt.  Pt informed she will be contacted once referral made.  Pt verbalized understanding and agreed w/ plan.  Message forwarded to J. Jeannetta Nap, RN to set up referral.

## 2012-09-30 NOTE — Telephone Encounter (Signed)
Called patient with information per Dr. Rennis Golden regarding referral to [facility] wound care center. Patient agreed with plan and verbalized understanding. Orders placed for referral.

## 2012-10-01 NOTE — Telephone Encounter (Signed)
[name],  It should go directly to them. You did well!!!

## 2012-10-08 ENCOUNTER — Other Ambulatory Visit (INDEPENDENT_AMBULATORY_CARE_PROVIDER_SITE_OTHER): Payer: Medicare Other

## 2012-10-08 ENCOUNTER — Ambulatory Visit (INDEPENDENT_AMBULATORY_CARE_PROVIDER_SITE_OTHER): Payer: Medicare Other | Admitting: Internal Medicine

## 2012-10-08 ENCOUNTER — Ambulatory Visit (INDEPENDENT_AMBULATORY_CARE_PROVIDER_SITE_OTHER)
Admission: RE | Admit: 2012-10-08 | Discharge: 2012-10-08 | Disposition: A | Discharge status: Home / Self Care | Payer: Medicare Other | Source: Ambulatory Visit | Attending: Internal Medicine | Admitting: Internal Medicine

## 2012-10-08 ENCOUNTER — Encounter: Payer: Self-pay | Admitting: Internal Medicine

## 2012-10-08 VITALS — BP 144/60 | HR 100 | Temp 98.3°F | Ht 63.0 in | Wt 128.0 lb

## 2012-10-08 DIAGNOSIS — J8410 Pulmonary fibrosis, unspecified: Secondary | ICD-10-CM

## 2012-10-08 DIAGNOSIS — J9610 Chronic respiratory failure, unspecified whether with hypoxia or hypercapnia: Secondary | ICD-10-CM

## 2012-10-08 DIAGNOSIS — Z23 Encounter for immunization: Secondary | ICD-10-CM

## 2012-10-08 DIAGNOSIS — J45909 Unspecified asthma, uncomplicated: Secondary | ICD-10-CM

## 2012-10-08 LAB — PULMONARY FUNCTION TEST

## 2012-10-08 LAB — CBC WITH DIFFERENTIAL/PLATELET
Basophils Relative: 0.4 % (ref 0.0–3.0)
Eosinophils Relative: 0.1 % (ref 0.0–5.0)
HCT: 32.4 % — ABNORMAL LOW (ref 36.0–46.0)
Hemoglobin: 10.8 g/dL — ABNORMAL LOW (ref 12.0–15.0)
Lymphs Abs: 2.5 10*3/uL (ref 0.7–4.0)
MCV: 86.3 fl (ref 78.0–100.0)
Monocytes Absolute: 1.1 10*3/uL — ABNORMAL HIGH (ref 0.1–1.0)
Monocytes Relative: 8.8 % (ref 3.0–12.0)
Platelets: 309 10*3/uL (ref 150.0–400.0)
RBC: 3.76 Mil/uL — ABNORMAL LOW (ref 3.87–5.11)
WBC: 11.9 10*3/uL — ABNORMAL HIGH (ref 4.5–10.5)

## 2012-10-08 LAB — SEDIMENTATION RATE: Sed Rate: 59 mm/hr — ABNORMAL HIGH (ref 0–22)

## 2012-10-08 NOTE — Progress Notes (Signed)
Subjective:     Patient ID: [name], female   DOB: 28-Oct-1927   MRN: [id number]     Primary is [name] with [name]    Brief patient profile:   38 yowf never sign smoker followed by [name] remotely with h/o recurrent pna as infant and extending into adulthood as "freq bronchitis" then middle age seemed less freq, less severe and then wose x 2011/12 but never on 02 24/7 until p hosp for an illness that started in May 2014 and resulted in [facility] with the following d/c summary :    Admit date: 08/23/2012  Discharge date: 08/25/2012  Recommendations for Outpatient Follow-up:  Follow up with PCP: Left ankle ulceration and left great toe blood blister, decreased TSH, and normocytic anemia.  Follow up with Dr. Sherene Sires of Pulmonology on 8/29 regarding recent pneumonia, chronic pulmonary fibrosis and COPD. On two liters of oxygen 24/7.  Home Health physical therapy recommended and ordered.  Home Health respiratory therapy ordered for education and assistance with nebulizer machine.  Discharge Diagnoses:  Active Problems:  CAP (community acquired pneumonia)  COPD (chronic obstructive pulmonary disease)  Chronic respiratory failure  GERD (gastroesophageal reflux disease)  Cirrhosis  Discharge Condition: much improved. Stable  Diet recommendation: regular diet as tolerated.  Filed Weights    08/23/12 1723   Weight:  56.6 kg (124 lb 12.5 oz)   History of present illness at the time of admission:  This is an 79 year old female, with known history of COPD, bronchial asthma, pulmonary fibrosis, on home O2, dyslipidemia,, hypothyroidism, HTN, GERD, Migraines, OA s/p back surgery, bilateral knee replacements and rotator cuff surgery, Liver cirrhosis, diverticulosis, chronic left foot wound, seen by Dr Zoila Shutter on 08/15/12, for worsening SOB, fevers up to 101, and chills. He arranged a chest CT scan, and on 08/16/12, called in a 5-day course of oral Avelox, which she was compliant with. Chest CT  scan was done on 08/18/12, and showed multiple areas of airspace consolidation involving all lobes of both lungs, most likely due to multilobar pneumonia. Patient completed her antibiotics on 08/20/12, but because of persistent symptoms and worsening tenacious cough, she called Dr Blanchie Dessert office, and was referred to the ED today. Patient complains of bilateral lower rib cage pains on deep inspiration and coughing.  Hospital Course:  CAP  Patient was treated with triple abx (vanc/rocephin/azithro) from 8/16 - 8/18.  She improved quickly. At the time of discharge she is ambulating on   2 liters of oxygen maintaining oxygen sats of 94%. It is felt that her quick recovery is more due to steroid therapy than antibiotic therapy. She will be discharged on Levaquin for 5 more days.  Acute on Chronic Respiratory Failure  She will be discharged on a slow prednisone taper in addition to spiriva and PRN nebulizers  Pulmonary follow up has been scheduled for pulmonary fibrosis  09/05/2012 f/u ov/Devorah Givhan re PF Chief Complaint  Patient presents with  . HFU    Pt states that her breathing is unchanged since hospital d/c. No new co's today.    doe x road and back on RA sat 90 on return, hoarse chronically with dry mouth and eyes, worse dry mouth on spiriva. rec Stop the fish oil and spiriva I recommend you come off the hormone therapy> d/c  Increase prilosec to Take 30- 60 min before your first and last meals of the day  Goal of oxygen is keeping your oxygen levels above 90%  GERD  diet    10/08/2012 f/u ov/Kelseigh Diver re:  Pf ? ALI ? BOOP no flare off steroids Chief Complaint  Patient presents with  . Follow-up with PFT    Breathing is unchanged. No new co's today. Using albuterol HFA twice daily adn albuterol neb once per day.  Doing better esp walking grocery cart on 02.     No obvious daytime variabilty or assoc chronic cough or cp or chest tightness, subjective wheeze overt sinus or hb symptoms. No unusual exp  hx or h/o childhood asthma or knowledge of premature birth.   Sleeping ok without nocturnal  or early am exacerbation  of respiratory  c/o's or need for noct saba. Also denies any obvious fluctuation of symptoms with weather or environmental changes or other aggravating or alleviating factors except as outlined above   Current Medications, Allergies, Past Medical History, Past Surgical History, Family History, and Social History were reviewed in [name] record.  ROS  The following are not active complaints unless bolded sore throat, dysphagia, dental problems, itching, sneezing,  nasal congestion or excess/ purulent secretions, ear ache,   fever, chills, sweats, unintended wt loss, pleuritic or exertional cp, hemoptysis,  orthopnea pnd or leg swelling, presyncope, palpitations, heartburn, abdominal pain, anorexia, nausea, vomiting, diarrhea  or change in bowel or urinary habits, change in stools or urine, dysuria,hematuria,  rash, arthralgias, visual complaints, headache, numbness weakness or ataxia or problems with walking or coordination,  change in mood/affect or memory.              Objective:   Physical Exam    Chronically ill amb wf nad   10/08/2012        128  Wt Readings from Last 3 Encounters:  09/05/12 125 lb 9.6 oz (56.972 kg)  09/02/12 124 lb 12.8 oz (56.609 kg)  08/23/12 124 lb 12.5 oz (56.6 kg)     HEENT: nl dentition, turbinates, and orophanx. Nl external ear canals without cough reflex   NECK :  without JVD/Nodes/TM/ nl carotid upstrokes bilaterally   LUNGS: no acc muscle use -  insp crackles post bilat,  No exp rhonchi   CV:  RRR  no s3 or murmur or increase in P2, no edema   ABD:  soft and nontender with nl excursion in the supine position. No bruits or organomegaly, bowel sounds nl  MS:  warm without deformities, calf tenderness, cyanosis or clubbing  SKIN: warm and dry without lesions       CXR  10/08/2012 :   Stable  chronic interstitial prominence. Mild improvement in aeration. Persistent nodular foci of consolidation probable pneumonia in left upper lobe left midlung and right midlung laterally. Follow-up to complete resolution is recommended. Stable fibrotic changes lung bases. No pulmonary edema.  Assessment:

## 2012-10-08 NOTE — Patient Instructions (Addendum)
02 use 2lpm at bedtime and only as needed for breathing difficulty during the day  Only use your albuterol as a rescue medication to be used if you can't catch your breath by resting or doing a relaxed purse lip breathing pattern.  - The less you use it, the better it will work when you need it. - Ok to use up to every 4 hours if you must but call for immediate appointment if use goes up over your usual need - Don't leave home without it !!  (think of it like your spare tire for your car)   Please remember to go to the lab and x-ray department downstairs for your tests - we will call you with the results when they are available.  Please schedule a follow up office visit in 6 weeks, call sooner if needed

## 2012-10-08 NOTE — Assessment & Plan Note (Signed)
-   09/05/12 reported amb off 02 with sats > 90 to mailbox and back  - 10/08/2012   Walked RA x 2 laps fast pace @ 185 stopped due to  Legs gave out with sats 94%  This strongly suggests this was acute resp failure that has resolved but for now will rec she stay on noct 02 and prn 02 during the day

## 2012-10-08 NOTE — Assessment & Plan Note (Addendum)
-   off prednisone since around 09/08/2012  - 10/08/2012 PFTs VC  1.45 s obst and DLCO 56 corrects to 111 - ESR 10/08/2012 = 59   Not clear yet whether she has ongoing boop like illness that is likely to flare at some point off steroids  or ali superimposed on PF but she's clearly better s need for 02 or steroids at this point so will continue rx for GERD and f/u in 6 weeks

## 2012-10-08 NOTE — Progress Notes (Signed)
PFT done today. 

## 2012-10-09 NOTE — Progress Notes (Signed)
Quick Note:  Advised pt of cxr result per MW. Pt verbalized understanding and has no further concerns or questions at this time ______

## 2012-10-09 NOTE — Progress Notes (Signed)
Quick Note:  Advised pt of lab results per MW. Pt verbalized understanding and has no concerns or questions at this time ______

## 2012-10-22 ENCOUNTER — Encounter (HOSPITAL_BASED_OUTPATIENT_CLINIC_OR_DEPARTMENT_OTHER): Payer: Medicare Other | Attending: General Surgery

## 2012-10-22 DIAGNOSIS — J449 Chronic obstructive pulmonary disease, unspecified: Secondary | ICD-10-CM | POA: Insufficient documentation

## 2012-10-22 DIAGNOSIS — E039 Hypothyroidism, unspecified: Secondary | ICD-10-CM | POA: Insufficient documentation

## 2012-10-22 DIAGNOSIS — J4489 Other specified chronic obstructive pulmonary disease: Secondary | ICD-10-CM | POA: Insufficient documentation

## 2012-10-22 DIAGNOSIS — Z79899 Other long term (current) drug therapy: Secondary | ICD-10-CM | POA: Insufficient documentation

## 2012-10-22 DIAGNOSIS — Z96659 Presence of unspecified artificial knee joint: Secondary | ICD-10-CM | POA: Insufficient documentation

## 2012-10-22 DIAGNOSIS — L97309 Non-pressure chronic ulcer of unspecified ankle with unspecified severity: Secondary | ICD-10-CM | POA: Insufficient documentation

## 2012-10-22 DIAGNOSIS — I10 Essential (primary) hypertension: Secondary | ICD-10-CM | POA: Insufficient documentation

## 2012-10-23 ENCOUNTER — Encounter: Payer: Self-pay | Admitting: Internal Medicine

## 2012-10-23 NOTE — Progress Notes (Signed)
[facility]  NAME:  [name], [name] NO.:  [id number]  MEDICAL RECORD NO.:  [id number]      DATE OF BIRTH:  1927-11-26  PHYSICIAN:  Ardath Sax, M.D.           VISIT DATE:                                  OFFICE VISIT   This is an 79 year old lady who comes to [place] because of ulcer probably due to minor trauma to the lateral aspect of her left ankle right over the lateral malleolus.  The ulcer is quite clean, somewhat erythematous and is only 2 mm in diameter.  I cleaned it up and treated it with silver alginate.  She states that it has been present since July, but that is hard to believe in that this is such a small superficial ulcer. She as I stated is quite elderly and is on many medicines including Synthroid, Prilosec, Robaxin for back pain, calcium, vitamins, glucosamine, Maxzide, Ventolin inhalers.  She states that she has had bilateral knee replacements and rotator cuff surgery, also had back surgery.  She has states that she has hypothyroidism, hypertension, and COPD.  On examination today, her blood pressure was 140/79, respirations 17, pulse 85, temperature 97.9, she weighs 125 pounds and is 5 foot and 3 inches.  DIAGNOSES: 1. Probable traumatic wound lateral aspect of left ankle. 2. Hypothyroidism. 3. Hypertension.     Ardath Sax, M.D.     PP/MEDQ  D:  10/22/2012  T:  10/23/2012  Job:  [id number]

## 2012-11-19 ENCOUNTER — Other Ambulatory Visit (INDEPENDENT_AMBULATORY_CARE_PROVIDER_SITE_OTHER): Payer: Medicare Other

## 2012-11-19 ENCOUNTER — Encounter: Payer: Self-pay | Admitting: Internal Medicine

## 2012-11-19 ENCOUNTER — Ambulatory Visit (INDEPENDENT_AMBULATORY_CARE_PROVIDER_SITE_OTHER): Payer: Medicare Other | Admitting: Internal Medicine

## 2012-11-19 ENCOUNTER — Encounter (INDEPENDENT_AMBULATORY_CARE_PROVIDER_SITE_OTHER): Payer: Self-pay

## 2012-11-19 ENCOUNTER — Ambulatory Visit (INDEPENDENT_AMBULATORY_CARE_PROVIDER_SITE_OTHER)
Admission: RE | Admit: 2012-11-19 | Discharge: 2012-11-19 | Disposition: A | Discharge status: Home / Self Care | Payer: Medicare Other | Source: Ambulatory Visit | Attending: Internal Medicine | Admitting: Internal Medicine

## 2012-11-19 VITALS — BP 160/80 | HR 84 | Temp 98.0°F | Ht 63.75 in | Wt 126.0 lb

## 2012-11-19 DIAGNOSIS — J8410 Pulmonary fibrosis, unspecified: Secondary | ICD-10-CM

## 2012-11-19 DIAGNOSIS — J9610 Chronic respiratory failure, unspecified whether with hypoxia or hypercapnia: Secondary | ICD-10-CM

## 2012-11-19 LAB — CBC WITH DIFFERENTIAL/PLATELET
Basophils Absolute: 0.1 10*3/uL (ref 0.0–0.1)
Basophils Relative: 0.6 % (ref 0.0–3.0)
Eosinophils Absolute: 0 10*3/uL (ref 0.0–0.7)
Hemoglobin: 11.4 g/dL — ABNORMAL LOW (ref 12.0–15.0)
Lymphocytes Relative: 20.1 % (ref 12.0–46.0)
MCHC: 33.4 g/dL (ref 30.0–36.0)
Monocytes Relative: 8.6 % (ref 3.0–12.0)
Neutrophils Relative %: 70.7 % (ref 43.0–77.0)
RBC: 3.98 Mil/uL (ref 3.87–5.11)
RDW: 15.2 % — ABNORMAL HIGH (ref 11.5–14.6)

## 2012-11-19 LAB — BASIC METABOLIC PANEL
Chloride: 102 mEq/L (ref 96–112)
GFR: 48.28 mL/min — ABNORMAL LOW (ref >60.00)
Potassium: 5.4 mEq/L — ABNORMAL HIGH (ref 3.5–5.1)
Sodium: 138 mEq/L (ref 135–145)

## 2012-11-19 NOTE — Patient Instructions (Addendum)
Please remember to go to the lab and x-ray department downstairs for your tests - we will call you with the results when they are available.     Please schedule a follow up office visit in 6 weeks, call sooner if needed with pfts Late add : clarify how much she really uses 02 on return

## 2012-11-19 NOTE — Progress Notes (Signed)
Subjective:     Patient ID: [name], female   DOB: 06/14/1927   MRN: [id number]     Primary is [name] with [name]    Brief patient profile:   79 yowf never sign smoker followed by [name] remotely with h/o recurrent pna as infant and extending into adulthood as "freq bronchitis" then middle age seemed less freq, less severe and then wose x 2011/12 but never on 02 24/7 until p hosp for an illness that started in May 2014 and resulted in hosp [facility] with the following d/c summary :    Admit date: 08/23/2012  Discharge date: 08/25/2012  Recommendations for Outpatient Follow-up:  Follow up with PCP: Left ankle ulceration and left great toe blood blister, decreased TSH, and normocytic anemia.  Follow up with Dr. Sherene Sires of Pulmonology on 8/29 regarding recent pneumonia, chronic pulmonary fibrosis and COPD. On two liters of oxygen 24/7.  Home Health physical therapy recommended and ordered.  Home Health respiratory therapy ordered for education and assistance with nebulizer machine.  Discharge Diagnoses:  Active Problems:  CAP (community acquired pneumonia)  COPD (chronic obstructive pulmonary disease)  Chronic respiratory failure  GERD (gastroesophageal reflux disease)  Cirrhosis  Discharge Condition: much improved. Stable  Diet recommendation: regular diet as tolerated.  Filed Weights    08/23/12 1723   Weight:  56.6 kg (124 lb 12.5 oz)   History of present illness at the time of admission:  This is an 79 year old female, with known history of COPD, bronchial asthma, pulmonary fibrosis, on home O2, dyslipidemia,, hypothyroidism, HTN, GERD, Migraines, OA s/p back surgery, bilateral knee replacements and rotator cuff surgery, Liver cirrhosis, diverticulosis, chronic left foot wound, seen by Dr Zoila Shutter on 08/15/12, for worsening SOB, fevers up to 101, and chills. He arranged a chest CT scan, and on 08/16/12, called in a 5-day course of oral Avelox, which she was compliant with. Chest CT  scan was done on 08/18/12, and showed multiple areas of airspace consolidation involving all lobes of both lungs, most likely due to multilobar pneumonia. Patient completed her antibiotics on 08/20/12, but because of persistent symptoms and worsening tenacious cough, she called Dr Blanchie Dessert office, and was referred to the ED today. Patient complains of bilateral lower rib cage pains on deep inspiration and coughing.  Hospital Course:  CAP  Patient was treated with triple abx (vanc/rocephin/azithro) from 8/16 - 8/18.  She improved quickly. At the time of discharge she is ambulating on   2 liters of oxygen maintaining oxygen sats of 94%. It is felt that her quick recovery is more due to steroid therapy than antibiotic therapy. She will be discharged on Levaquin for 5 more days.  Acute on Chronic Respiratory Failure  She will be discharged on a slow prednisone taper in addition to spiriva and PRN nebulizers  Pulmonary follow up has been scheduled for pulmonary fibrosis  09/05/2012 f/u ov/Lovetta Condie re PF Chief Complaint  Patient presents with  . HFU    Pt states that her breathing is unchanged since hospital d/c. No new co's today.    doe x road and back on RA sat 90 on return, hoarse chronically with dry mouth and eyes, worse dry mouth on spiriva. rec Stop the fish oil and spiriva I recommend you come off the hormone therapy> d/c  Increase prilosec to Take 30- 60 min before your first and last meals of the day  Goal of oxygen is keeping your oxygen levels above 90%  GERD  diet    10/08/2012 f/u ov/Jasira Robinson re:  Pf ? ALI ? BOOP no flare off steroids Chief Complaint  Patient presents with  . Follow-up with PFT    Breathing is unchanged. No new co's today. Using albuterol HFA twice daily adn albuterol neb once per day.  Doing better esp walking grocery cart on 02.   rec 02 use 2lpm at bedtime and only as needed for breathing difficulty during the day Only use your albuterol as a rescue  medication   11/19/2012 f/u ov/Braylon Grenda re: PF/ 02 dep Chief Complaint  Patient presents with  . Follow-up    Pt states her breathing is unchanged. She has used albuterol HFA 4 times in the past wk. She had to use neb 1 day ago.  She c/o throat clearing and cough x 10 days- prod with minimal white, foamy sputum.       No obvious daytime variabilty or assoc cp or chest tightness, subjective wheeze overt sinus or hb symptoms. No unusual exp hx or h/o childhood asthma or knowledge of premature birth.   Sleeping ok without nocturnal  or early am exacerbation  of respiratory  c/o's or need for noct saba. Also denies any obvious fluctuation of symptoms with weather or environmental changes or other aggravating or alleviating factors except as outlined above   Current Medications, Allergies, Past Medical History, Past Surgical History, Family History, and Social History were reviewed in [name] record.  ROS  The following are not active complaints unless bolded sore throat, dysphagia, dental problems, itching, sneezing,  nasal congestion or excess/ purulent secretions, ear ache,   fever, chills, sweats, unintended wt loss, pleuritic or exertional cp, hemoptysis,  orthopnea pnd or leg swelling, presyncope, palpitations, heartburn, abdominal pain, anorexia, nausea, vomiting, diarrhea  or change in bowel or urinary habits, change in stools or urine, dysuria,hematuria,  rash, arthralgias, visual complaints, headache, numbness weakness or ataxia or problems with walking or coordination,  change in mood/affect or memory.              Objective:   Physical Exam    Chronically ill amb wf nad  Wt Readings from Last 3 Encounters:  11/19/12 126 lb (57.153 kg)  10/08/12 128 lb (58.06 kg)  09/05/12 125 lb 9.6 oz (56.972 kg)      HEENT: nl dentition, turbinates, and orophanx. Nl external ear canals without cough reflex   NECK :  without JVD/Nodes/TM/ nl carotid upstrokes  bilaterally   LUNGS: no acc muscle use -  insp crackles post bilat,  No exp rhonchi   CV:  RRR  no s3 or murmur or increase in P2, no edema   ABD:  soft and nontender with nl excursion in the supine position. No bruits or organomegaly, bowel sounds nl  MS:  warm without deformities, calf tenderness, cyanosis or clubbing  SKIN: warm and dry without lesions       CXR  11/19/2012 :  The appearance of the chest has not significantly changed since the previous study consistent with pulmonary fibrosis. No definite acute pneumonia is demonstrated and there is no evidence of CHF. ESR 59 > 45    Assessment:

## 2012-11-21 NOTE — Assessment & Plan Note (Signed)
-   09/05/12 reported amb off 02 with sats > 90 to mailbox and back  - 10/08/2012   Walked RA x 2 laps fast pace @ 185 stopped due to  Legs gave out with sats 94% - 11/19/2012  Walked RA  2 laps @ 185 ft each stopped due to  Weak legs  Adequate control on present rx, reviewed > no change in rx needed  (does not require 02 at this point except with heavy exertion)

## 2012-11-21 NOTE — Assessment & Plan Note (Addendum)
-   off prednisone since around 09/08/2012  - 10/08/2012 PFTs VC  1.45 s obst and DLCO 56 corrects to 111 - ESR 10/08/2012 = 59  - down to 45 11/19/12   Whatever the inflammatory component was, does not really appeared to have flared convincingly off steroids so no need to change rx at present - basically just treating her for gerd and prn with saba for now    Each maintenance medication was reviewed in detail including most importantly the difference between maintenance and as needed and under what circumstances the prns are to be used.  Please see instructions for details which were reviewed in writing and the patient given a copy.

## 2012-12-30 ENCOUNTER — Other Ambulatory Visit: Payer: Self-pay | Admitting: Internal Medicine

## 2012-12-30 DIAGNOSIS — J8410 Pulmonary fibrosis, unspecified: Secondary | ICD-10-CM

## 2013-01-05 ENCOUNTER — Ambulatory Visit (INDEPENDENT_AMBULATORY_CARE_PROVIDER_SITE_OTHER): Payer: Medicare Other | Admitting: Internal Medicine

## 2013-01-05 ENCOUNTER — Other Ambulatory Visit (INDEPENDENT_AMBULATORY_CARE_PROVIDER_SITE_OTHER): Payer: Medicare Other

## 2013-01-05 ENCOUNTER — Encounter: Payer: Self-pay | Admitting: Internal Medicine

## 2013-01-05 VITALS — BP 140/62 | HR 90 | Temp 97.8°F | Ht 63.0 in | Wt 122.0 lb

## 2013-01-05 DIAGNOSIS — J8410 Pulmonary fibrosis, unspecified: Secondary | ICD-10-CM

## 2013-01-05 DIAGNOSIS — I10 Essential (primary) hypertension: Secondary | ICD-10-CM

## 2013-01-05 DIAGNOSIS — J9610 Chronic respiratory failure, unspecified whether with hypoxia or hypercapnia: Secondary | ICD-10-CM

## 2013-01-05 DIAGNOSIS — E039 Hypothyroidism, unspecified: Secondary | ICD-10-CM

## 2013-01-05 LAB — BASIC METABOLIC PANEL
Calcium: 9.6 mg/dL (ref 8.4–10.5)
Creatinine, Ser: 1.1 mg/dL (ref 0.4–1.2)
GFR: 48.27 mL/min — ABNORMAL LOW (ref >60.00)
Glucose, Bld: 122 mg/dL — ABNORMAL HIGH (ref 70–99)
Sodium: 141 mEq/L (ref 135–145)

## 2013-01-05 LAB — PULMONARY FUNCTION TEST
DL/VA % pred: 123 %
DLCO unc % pred: 62 %
DLCO unc: 14.4 ml/min/mmHg
FEF 25-75 Pre: 0.8 L/sec
FEF2575-%Pred-Pre: 69 %
FEV6-%Change-Post: 0 %
FEV6-%Pred-Post: 66 %
FEV6-%Pred-Pre: 65 %
FEV6-Post: 1.44 L
FEV6-Pre: 1.42 L
FEV6FVC-%Change-Post: 0 %
FEV6FVC-%Pred-Post: 106 %
FVC-%Change-Post: 0 %
FVC-%Pred-Post: 61 %
FVC-Post: 1.44 L
FVC-Pre: 1.44 L
Post FEV6/FVC ratio: 100 %
RV: 2.31 L
TLC % pred: 74 %

## 2013-01-05 LAB — CBC WITH DIFFERENTIAL/PLATELET
Basophils Absolute: 0.1 10*3/uL (ref 0.0–0.1)
Eosinophils Absolute: 0 10*3/uL (ref 0.0–0.7)
Hemoglobin: 11.9 g/dL — ABNORMAL LOW (ref 12.0–15.0)
Lymphocytes Relative: 20.6 % (ref 12.0–46.0)
MCHC: 32.8 g/dL (ref 30.0–36.0)
MCV: 86 fl (ref 78.0–100.0)
Monocytes Absolute: 0.8 10*3/uL (ref 0.1–1.0)
Monocytes Relative: 8.1 % (ref 3.0–12.0)
Neutro Abs: 7.4 10*3/uL (ref 1.4–7.7)
Neutrophils Relative %: 70.7 % (ref 43.0–77.0)
RDW: 14.3 % (ref 11.5–14.6)

## 2013-01-05 LAB — SEDIMENTATION RATE: Sed Rate: 42 mm/hr — ABNORMAL HIGH (ref 0–22)

## 2013-01-05 LAB — TSH: TSH: 0.6 u[IU]/mL (ref 0.35–5.50)

## 2013-01-05 NOTE — Progress Notes (Signed)
Subjective:     Patient ID: [name], female   DOB: 1927-09-10   MRN: [id number]     Primary is [name] with [name]    Brief patient profile:   79 yowf never sign smoker followed by [name] remotely with h/o recurrent pna as infant and extending into adulthood as "freq bronchitis" then middle age seemed less freq, less severe and then wose x 2011/12 but never on 02 24/7 until p hosp for an illness that started in May 2014 and resulted in hosp [facility] with the following d/c summary :    Admit date: 08/23/2012  Discharge date: 08/25/2012  Recommendations for Outpatient Follow-up:  Follow up with PCP: Left ankle ulceration and left great toe blood blister, decreased TSH, and normocytic anemia.  Follow up with Dr. Sherene Sires of Pulmonology on 8/29 regarding recent pneumonia, chronic pulmonary fibrosis and COPD. On two liters of oxygen 24/7.  Home Health physical therapy recommended and ordered.  Home Health respiratory therapy ordered for education and assistance with nebulizer machine.  Discharge Diagnoses:  Active Problems:  CAP (community acquired pneumonia)  COPD (chronic obstructive pulmonary disease)  Chronic respiratory failure  GERD (gastroesophageal reflux disease)  Cirrhosis  Discharge Condition: much improved. Stable  Diet recommendation: regular diet as tolerated.  Filed Weights    08/23/12 1723   Weight:  56.6 kg (124 lb 12.5 oz)   History of present illness at the time of admission:  This is an 79 year old female, with known history of COPD, bronchial asthma, pulmonary fibrosis, on home O2, dyslipidemia,, hypothyroidism, HTN, GERD, Migraines, OA s/p back surgery, bilateral knee replacements and rotator cuff surgery, Liver cirrhosis, diverticulosis, chronic left foot wound, seen by Dr Zoila Shutter on 08/15/12, for worsening SOB, fevers up to 101, and chills. He arranged a chest CT scan, and on 08/16/12, called in a 5-day course of oral Avelox, which she was compliant with. Chest CT  scan was done on 08/18/12, and showed multiple areas of airspace consolidation involving all lobes of both lungs, most likely due to multilobar pneumonia. Patient completed her antibiotics on 08/20/12, but because of persistent symptoms and worsening tenacious cough, she called Dr Blanchie Dessert office, and was referred to the ED today. Patient complains of bilateral lower rib cage pains on deep inspiration and coughing.  Hospital Course:  CAP  Patient was treated with triple abx (vanc/rocephin/azithro) from 8/16 - 8/18.  She improved quickly. At the time of discharge she is ambulating on   2 liters of oxygen maintaining oxygen sats of 94%. It is felt that her quick recovery is more due to steroid therapy than antibiotic therapy. She will be discharged on Levaquin for 5 more days.  Acute on Chronic Respiratory Failure  She will be discharged on a slow prednisone taper in addition to spiriva and PRN nebulizers  Pulmonary follow up has been scheduled for pulmonary fibrosis  09/05/2012 f/u ov/Pranav Lince re PF Chief Complaint  Patient presents with  . HFU    Pt states that her breathing is unchanged since hospital d/c. No new co's today.   doe x walk to road and back on RA sat 90 on return, hoarse chronically with dry mouth and eyes, worse dry mouth on spiriva. rec Stop the fish oil and spiriva I recommend you come off the hormone therapy> d/c d Increase prilosec to Take 30- 60 min before your first and last meals of the day  Goal of oxygen is keeping your oxygen levels above 90%  GERD diet    10/08/2012 f/u ov/Carvel Huskins re:  Pf ? ALI ? BOOP no flare off steroids Chief Complaint  Patient presents with  . Follow-up with PFT    Breathing is unchanged. No new co's today. Using albuterol HFA twice daily adn albuterol neb once per day.  Doing better esp walking grocery cart on 02.   rec 02 use 2lpm at bedtime and only as needed for breathing difficulty during the day Only use your albuterol as a rescue  medication     01/05/2013 f/u ov/Shamal Stracener re: PF/  Chief Complaint  Patient presents with  . Followup with PFT    Pt states her breathing and cough are unchanged since her last visit.   when gets oob > sits down and uses neb and feels better Not using 02 x at hs.   No obvious daytime variabilty or assoc excess mucus or cp or chest tightness, subjective wheeze overt sinus or hb symptoms. No unusual exp hx or h/o childhood asthma or knowledge of premature birth.   Sleeping ok without nocturnal  or early am exacerbation  of respiratory  c/o's or need for noct saba. Also denies any obvious fluctuation of symptoms with weather or environmental changes or other aggravating or alleviating factors except as outlined above   Current Medications, Allergies, Past Medical History, Past Surgical History, Family History, and Social History were reviewed in [name] record.  ROS  The following are not active complaints unless bolded sore throat, dysphagia, dental problems, itching, sneezing,  nasal congestion or excess/ purulent secretions, ear ache,   fever, chills, sweats, unintended wt loss, pleuritic or exertional cp, hemoptysis,  orthopnea pnd or leg swelling, presyncope, palpitations, heartburn, abdominal pain, anorexia, nausea, vomiting, diarrhea  or change in bowel or urinary habits, change in stools or urine, dysuria,hematuria,  rash, arthralgias, visual complaints, headache, numbness weakness or ataxia or problems with walking or coordination,  change in mood/affect or memory.              Objective:   Physical Exam    Chronically ill amb wf nad walking with cane but able to get up on table s assistance  01/05/2013     122  Wt Readings from Last 3 Encounters:  11/19/12 126 lb (57.153 kg)  10/08/12 128 lb (58.06 kg)  09/05/12 125 lb 9.6 oz (56.972 kg)      HEENT: nl dentition, turbinates, and orophanx. Nl external ear canals without cough reflex   NECK :   without JVD/Nodes/TM/ nl carotid upstrokes bilaterally   LUNGS: no acc muscle use -  insp crackles post bilat,  No exp rhonchi   CV:  RRR  no s3 or murmur or increase in P2, no edema   ABD:  soft and nontender with nl excursion in the supine position. No bruits or organomegaly, bowel sounds nl  MS:  warm without deformities, calf tenderness, cyanosis or clubbing  SKIN: warm and dry without lesions       CXR  11/19/2012 : The appearance of the chest has not significantly changed since the previous study consistent with pulmonary fibrosis. No definite acute pneumonia is demonstrated and there is no evidence of CHF. ESR 59 > 45 > 01/05/2013   TSH 01/05/2013 = 0.60   Assessment:

## 2013-01-05 NOTE — Progress Notes (Signed)
PFT done today. 

## 2013-01-05 NOTE — Patient Instructions (Addendum)
Please remember to go to the lab   department downstairs for your tests - we will call you with the results when they are available.     Please schedule a follow up visit in 3 months but call sooner if needed  

## 2013-01-06 NOTE — Progress Notes (Signed)
Quick Note:  Spoke with pt and notified of results per Dr. Wert. Pt verbalized understanding and denied any questions.  ______ 

## 2013-01-06 NOTE — Assessment & Plan Note (Signed)
-   off prednisone since around 09/08/2012  - 10/08/2012 PFTs VC  1.45 s obst and DLCO 56 corrects to 111 - 01/05/2013  PFTs VC  1.44 s obst dlco 62 correccts to 123  - ESR 10/08/2012 = 59  - down to 45 11/19/12 and 42 on 01/05/13   No evidence of flare off prednisone so no change rx for now

## 2013-01-06 NOTE — Assessment & Plan Note (Signed)
-   09/05/12 reported amb off 02 with sats > 90 to mailbox and back  - 10/08/2012   Walked RA x 2 laps fast pace @ 185 stopped due to  Legs gave out with sats 94% - 11/19/2012  Walked RA  2 laps @ 185 ft each stopped due to  Weak legs  For now continue 02 just at hs and use prn with exertion if feels it helps

## 2013-01-06 NOTE — Assessment & Plan Note (Signed)
Adequate control on present rx, reviewed > no change in rx needed   

## 2013-01-06 NOTE — Assessment & Plan Note (Signed)
Lab Results  Component Value Date   TSH 0.60 01/05/2013     Labs checked at pt's daughters request > Adequate control on present rx, reviewed > no change in rx needed

## 2013-01-26 ENCOUNTER — Telehealth: Payer: Self-pay | Admitting: Internal Medicine

## 2013-01-26 NOTE — Telephone Encounter (Signed)
Spoke with patient-states MW has not given her one in the past but her current placard is expired. Pt needs 2 (1 for when she travels in a car with someone and 1 for her car). MW please advise if you are willing to fill one out for patient and we can place at front for pick up.  Pt aware we will call her either way.

## 2013-01-26 NOTE — Telephone Encounter (Signed)
Placard signed by MW and placed at front for pick up; pt aware.

## 2013-01-26 NOTE — Telephone Encounter (Signed)
Ok with me 

## 2013-01-26 NOTE — Telephone Encounter (Signed)
ATC line busy x 2 

## 2013-03-10 ENCOUNTER — Ambulatory Visit (INDEPENDENT_AMBULATORY_CARE_PROVIDER_SITE_OTHER): Payer: Medicare Other | Admitting: Internal Medicine

## 2013-03-10 ENCOUNTER — Encounter: Payer: Self-pay | Admitting: Internal Medicine

## 2013-03-10 VITALS — BP 160/76 | HR 87 | Ht 63.0 in | Wt 119.6 lb

## 2013-03-10 DIAGNOSIS — J8410 Pulmonary fibrosis, unspecified: Secondary | ICD-10-CM

## 2013-03-10 DIAGNOSIS — J9610 Chronic respiratory failure, unspecified whether with hypoxia or hypercapnia: Secondary | ICD-10-CM

## 2013-03-10 DIAGNOSIS — I10 Essential (primary) hypertension: Secondary | ICD-10-CM

## 2013-03-10 MED ORDER — IRBESARTAN 300 MG PO TABS: 300.0000 mg | ORAL_TABLET | Freq: Every day | ORAL | Status: DC

## 2013-03-10 NOTE — Progress Notes (Signed)
OFFICE NOTE  Chief Complaint:  Routine followup  Primary Care Physician: Darrow Bussing, MD  HPI:  [name]  is an 80 year old female with a history of knee surgery on the right, low back surgery, hypertension, hypothyroidism, mild dyslipidemia and GERD. She was low risk for surgery, had an echo which showed a normal EF, but has had right lower extremity swelling. She reports she thought she had prior stripping to the left greater saphenous vein graft in the past. She underwent arterial and venous Dopplers of the legs on December 11, 2011 which showed a slightly reduced ABI on the right with an increased velocity at the popliteal artery suggesting 50% reduction, however, normal ABI on the left. The venous Dopplers indicated no evidence of venous insufficiency and all of the visualized vessels were present, including both the left and right greater saphenous veins, suggesting that if they had performed prior venous stripping that perhaps she had an accessory vessel removed but it was not the greater saphenous vein. Overall there is no clear evidence to support venous ablation or other treatment.   Her main complaint today is worsening shortness of breath. She is known to have COPD and pulmonary fibrosis. Recently, perhaps over the past few weeks, she has had worsening shortness of breath ultimately requiring home oxygen. She tells me that her O2 saturations were less than 80% in the office. She was not treated with antibiotics per her report nor given steroids. I do not see a chest x-ray since 2013. At that time a CT scan was recommended to further delineate the extent of her pulmonary fibrosis. She reports that she is having ongoing fevers and chills. Her temperature actually was 100.1 last evening and her MAXIMUM TEMPERATURE was 101.9. She is not coughing up any sputum but has significant bilateral pleuritic chest pain which is worse in the axillary areas. She is markedly fatigued and appears  in mild distress.  Her laboratory work demonstrated a leukocytosis with a left shift. And the CT scan demonstrated multilobular pneumonia superimposed on lumbar fibrosis. Based on these findings I recommended starting her on Avelox which she took for 5 days. She initially had small improvement in her symptoms, but did not get substantively better. I did recommend holding her diuretic and this resulted in improved renal function. At that point I recommended she present to the hospital as she was felt to fail outpatient antibiotic therapy. She was admitted and spent several days in the hospital receiving broad-spectrum IV antibiotics with a good response. She was discharged and returns for followup today with some improvement in her breathing and energy level. She does report a small amount of bilateral lower extremity edema which is dependent, probably related to the fact that we stopped her diuretic.   [name] returns today and reports continued breathlessness. Unfortunately she did not have any improvement with steroids. She continues to use oxygen at night or during exertion although does feel short of breath almost all the time. She is now back on Maxzide every other day which does help with her lower extremity swelling. It should be noted that her blood pressure is elevated today and has been running in the 150 to 160 systolic range for several weeks.  PMHx:  Past Medical History  Diagnosis Date  . COPD (chronic obstructive pulmonary disease)   . Hyperlipemia   . Heart murmur   . Shortness of breath     with exertion  . Asthma   . Pneumonia  hx of frequent episodes of pneumonia  . Headache(784.0)     MIgraine- Visualchanges- flashing lights and cant focus and cant see.  Marland Kitchen. GERD (gastroesophageal reflux disease)   . Arthritis   . Diverticulitis 2008  . Pulmonary fibrosis   . Hypertension   . Cirrhosis of liver not due to alcohol   . Hypothyroidism     Past Surgical History    Procedure Laterality Date  . Eye surgery  05/2008    - bilateral  . Back surgery    . Total knee arthroplasty  11/2008  . Rotator cuff repair  2008    right  . Retinal tear repair cryotherapy    . Tonsillectomy    . Appendectomy    . Dilation and curettage of uterus    . Total knee arthroplasty  07/03/2011    Procedure: TOTAL KNEE ARTHROPLASTY;  Surgeon: Valeria BatmanPeter W Whitfield, MD;  [facility];  Service: Orthopedics;  Laterality: Right;  . Transthoracic echocardiogram  04/12/2011    EF>55%; mod concentric LVH; mild-mod TR  . Varicose vein surgery      FAMHx:  Family History  Problem Relation Age of Onset  . Multiple sclerosis Sister   . Diabetes Sister   . Heart disease Sister   . Heart attack Brother   . Stroke Maternal Grandmother   . Hypertension Sister   . Hyperlipidemia Sister   . Stroke Sister     SOCHx:   reports that she quit smoking about 66 years ago. She has never used smokeless tobacco. She reports that she does not drink alcohol or use illicit drugs.  ALLERGIES:  No Known Allergies  ROS: A comprehensive review of systems was negative except for: Constitutional: positive for chills, fatigue, fevers and malaise Respiratory: positive for dyspnea on exertion and emphysema  HOME MEDS: Current Outpatient Prescriptions  Medication Sig Dispense Refill  . acetaminophen (TYLENOL) 325 MG tablet Take 2 tablets (650 mg total) by mouth every 6 (six) hours as needed.      Marland Kitchen. albuterol (PROVENTIL HFA;VENTOLIN HFA) 108 (90 BASE) MCG/ACT inhaler Inhale 2 puffs into the lungs every 6 (six) hours as needed. For shortness of breath      . albuterol (PROVENTIL) (5 MG/ML) 0.5% nebulizer solution Take 0.5 mL (2.5 mg total) by nebulization every 6 (six) hours as needed for wheezing.  20 mL  12  . aspirin 81 MG tablet Take 81 mg by mouth daily.      . calcium carbonate (OS-CAL) 600 MG TABS tablet Take 600 mg by mouth daily with breakfast.      . Cholecalciferol (VITAMIN D-3 PO) Take  2,000 Units by mouth daily.       . diphenhydramine-acetaminophen (TYLENOL PM) 25-500 MG TABS Take 1 tablet by mouth at bedtime as needed.      . irbesartan (AVAPRO) 300 MG tablet Take 1 tablet (300 mg total) by mouth daily.  30 tablet  4  . levothyroxine (SYNTHROID, LEVOTHROID) 88 MCG tablet Take 88 mcg by mouth daily before breakfast.      . magnesium oxide (MAG-OX) 400 MG tablet Take 400 mg by mouth 2 (two) times daily.       . meloxicam (MOBIC) 15 MG tablet Take 1 tablet by mouth daily.      . methocarbamol (ROBAXIN) 500 MG tablet Take 500 mg by mouth 3 (three) times daily as needed (for muscle spasms).       Marland Kitchen. omeprazole (PRILOSEC) 20 MG capsule Take 30- 60 min  before your first and last meals of the day      . Polyethyl Glycol-Propyl Glycol (SYSTANE OP) Place 1 drop into both eyes 3 (three) times daily as needed. For dry eyes      . traMADol (ULTRAM) 50 MG tablet Take 50 mg by mouth every 6 (six) hours as needed for pain.      Marland Kitchen triamterene-hydrochlorothiazide (MAXZIDE-25) 37.5-25 MG per tablet Take 1 tablet by mouth every other day.        No current facility-administered medications for this visit.    LABS/IMAGING: No results found for this or any previous visit (from the past 48 hour(s)). No results found.  VITALS: BP 160/76  Pulse 87  Ht 5\' 3"  (1.6 m)  Wt 119 lb 9.6 oz (54.25 kg)  BMI 21.19 kg/m2  EXAM: General appearance: alert and mild distress Neck: no adenopathy, no carotid bruit, no JVD, supple, symmetrical, trachea midline and thyroid not enlarged, symmetric, no tenderness/mass/nodules Lungs: diminished breath sounds bibasilar, dry crackles bilaterally, no egophony Heart: regular rate and rhythm, S1, S2 normal, no murmur, click, rub or gallop Abdomen: soft, non-tender; bowel sounds normal; no masses,  no organomegaly Extremities: extremities normal, atraumatic, no cyanosis or edema Pulses: 2+ and symmetric Skin: Skin color, texture, turgor normal. No rashes or  lesions Neurologic: Grossly normal  EKG: Normal sinus rhythm at 87  ASSESSMENT: 1. COPD and pulmonary fibrosis - s/p hospitalization and IV antibiotics for pneumonia 2. Uncontrolled HTN  PLAN: 1.   [name] continues to be breathless and I suspect this is due to chronic respiratory failure and pulmonary fibrosis. Her blood pressure is persistently elevated, therefore I would recommend increasing her Avapro to 300 mg daily.  She should continue the diuretic every other day.  Followup with me in 1 month to re-assess blood pressure.  Chrystie Nose, MD, [facility] Attending Cardiologist [facility]  Dary Dilauro C 03/10/2013, 4:57 PM

## 2013-03-10 NOTE — Patient Instructions (Addendum)
   Your physician has recommended you make the following change in your medication: INCREASE irbesartan to 300mg  once daily.   Your physician recommends that you schedule a follow-up appointment in: 1 month.

## 2013-03-11 ENCOUNTER — Telehealth: Payer: Self-pay | Admitting: Internal Medicine

## 2013-03-11 NOTE — Telephone Encounter (Signed)
Please call-Pt was  Seen yesterday and said she said she told the nurse something incorrect about her medicine.

## 2013-03-11 NOTE — Telephone Encounter (Signed)
Spoke to patient. She states she was confused about the medication change from yesterday visit.  Patient states triam -hct -q.o.d . ;  Dr Rennis GoldenHilty increase irbesartan to 300 mg daily and wanted patient to continue with maxzide every other day Patient verbalized understanding

## 2013-03-25 ENCOUNTER — Other Ambulatory Visit (HOSPITAL_COMMUNITY): Payer: Self-pay | Admitting: Family Medicine

## 2013-03-25 ENCOUNTER — Ambulatory Visit (HOSPITAL_COMMUNITY)
Admission: RE | Admit: 2013-03-25 | Discharge: 2013-03-25 | Disposition: A | Discharge status: Home / Self Care | Payer: Medicare Other | Source: Ambulatory Visit | Attending: Family Medicine | Admitting: Family Medicine

## 2013-03-25 DIAGNOSIS — F29 Unspecified psychosis not due to a substance or known physiological condition: Secondary | ICD-10-CM | POA: Insufficient documentation

## 2013-03-25 DIAGNOSIS — G319 Degenerative disease of nervous system, unspecified: Secondary | ICD-10-CM | POA: Insufficient documentation

## 2013-03-25 DIAGNOSIS — R51 Headache: Principal | ICD-10-CM

## 2013-03-25 DIAGNOSIS — R262 Difficulty in walking, not elsewhere classified: Secondary | ICD-10-CM | POA: Insufficient documentation

## 2013-03-25 DIAGNOSIS — G930 Cerebral cysts: Secondary | ICD-10-CM | POA: Insufficient documentation

## 2013-03-25 DIAGNOSIS — G939 Disorder of brain, unspecified: Secondary | ICD-10-CM | POA: Insufficient documentation

## 2013-03-25 DIAGNOSIS — R259 Unspecified abnormal involuntary movements: Secondary | ICD-10-CM | POA: Insufficient documentation

## 2013-03-25 DIAGNOSIS — G9389 Other specified disorders of brain: Secondary | ICD-10-CM | POA: Insufficient documentation

## 2013-03-25 DIAGNOSIS — R519 Headache, unspecified: Secondary | ICD-10-CM

## 2013-03-25 DIAGNOSIS — R110 Nausea: Secondary | ICD-10-CM | POA: Insufficient documentation

## 2013-03-25 NOTE — Discharge Instructions (Signed)
Altitude Sickness in Sports When someone who is not used to being at high altitudes (usually 8000 ft or higher) travels to high altitude, he or she may experience altitude sickness (mountain sickness). Symptoms are often present within 12 to 36 hours of ascent. Altitude sickness occurs in 25% of people traveling to high altitude. It usually goes away 1 to 2 days after staying at the same altitude. At high altitudes, the air contains less oxygen than at lower altitudes. The decrease in oxygen requires your body to work harder, in order to get the oxygen it needs. Anyone may get altitude sickness, despite their level of physical fitness. RISK INCREASES WITH:  Dehydration.  Chronic lung diseases (asthma, COPD).  Diabetes.  Heart disease.  Not acclimating to elevation change.  Smoking.  Alcohol use.  Anemia (not enough red blood cells). SYMPTOMS   Flu-like symptoms.  Loss of appetite.  Difficulty breathing.  Trouble sleeping.  Headache.  Nausea.  Confusion.  Fatigue.  Vomiting.  Disorientation.  Shortness of breath.  Weakness.  Trouble seeing clearly.  Loss of balance.  Coughing up pink-colored phlegm. PREVENTION   Ascend to higher elevations slowly. Allow the body to adjust (acclimate).  Wait 2 to 3 days before starting physical activity at high altitudes.  Stay hydrated.  Avoid drinking alcohol, coffee, or tea.  Avoid smoking.  Avoid the use of sleeping pills.  Avoid medicines that are diuretics (make you urinate frequently). If you have a prescription for such medicine, ask your caregiver before discontinuing the medicine.  If you have experienced altitude sickness in the past, your caregiver may be able to prescribe a medicine for you that prevents altitude sickness.  If prescribed, take medicine before ascending to high altitude and continue taking the medicine throughout your stay. RELATED COMPLICATIONS   Fluid buildup in the lungs (pulmonary  edema).  Swelling of the brain (cerebral edema).  Death. TREATMENT The most definitive treatment for altitude sickness is returning to a lower elevation. Other treatments include giving oxygen for 12 to 24 hours and fluid consumption. You may be prescribed medicines such as acetazolamide (Diamox) by a caregiver. If symptoms go away after returning to a lower altitude, you may try returning to a higher elevation only after your body adjusts, which may take 1 to 3 days. High-altitude edema (pulmonary edema or cerebral edema) is a very serious condition that can be fatal. If you are experiencing edema, do not return to higher altitude. Document Released: 12/25/2004 Document Revised: 03/19/2011 Document Reviewed: 04/08/2008 [facility] Patient Information 2014 [employer], [place].

## 2013-04-14 ENCOUNTER — Ambulatory Visit (INDEPENDENT_AMBULATORY_CARE_PROVIDER_SITE_OTHER): Payer: Medicare Other | Admitting: Internal Medicine

## 2013-04-14 ENCOUNTER — Encounter: Payer: Self-pay | Admitting: Internal Medicine

## 2013-04-14 VITALS — BP 132/80 | HR 105 | Ht 63.0 in | Wt 117.7 lb

## 2013-04-14 DIAGNOSIS — R002 Palpitations: Secondary | ICD-10-CM

## 2013-04-14 DIAGNOSIS — I10 Essential (primary) hypertension: Secondary | ICD-10-CM

## 2013-04-14 DIAGNOSIS — J189 Pneumonia, unspecified organism: Secondary | ICD-10-CM

## 2013-04-14 DIAGNOSIS — J9610 Chronic respiratory failure, unspecified whether with hypoxia or hypercapnia: Secondary | ICD-10-CM

## 2013-04-14 DIAGNOSIS — J8410 Pulmonary fibrosis, unspecified: Secondary | ICD-10-CM

## 2013-04-14 NOTE — Progress Notes (Signed)
OFFICE NOTE  Chief Complaint:  Follow-up HTN, productive cough  Primary Care Physician: Darrow Bussing, MD  HPI:  [name]  is an 80 year old female with a history of knee surgery on the right, low back surgery, hypertension, hypothyroidism, mild dyslipidemia and GERD. She was low risk for surgery, had an echo which showed a normal EF, but has had right lower extremity swelling. She reports she thought she had prior stripping to the left greater saphenous vein graft in the past. She underwent arterial and venous Dopplers of the legs on December 11, 2011 which showed a slightly reduced ABI on the right with an increased velocity at the popliteal artery suggesting 50% reduction, however, normal ABI on the left. The venous Dopplers indicated no evidence of venous insufficiency and all of the visualized vessels were present, including both the left and right greater saphenous veins, suggesting that if they had performed prior venous stripping that perhaps she had an accessory vessel removed but it was not the greater saphenous vein. Overall there is no clear evidence to support venous ablation or other treatment.   She has recently complained of worsening shortness of breath. She is known to have COPD and pulmonary fibrosis. Recently, perhaps over the past few weeks, she has had worsening shortness of breath ultimately requiring home oxygen. She tells me that her O2 saturations were less than 80% in the office. She was not treated with antibiotics per her report nor given steroids. I do not see a chest x-ray since 2013. At that time a CT scan was recommended to further delineate the extent of her pulmonary fibrosis. She reports that she is having ongoing fevers and chills. Her temperature actually was 100.1 last evening and her MAXIMUM TEMPERATURE was 101.9. She is not coughing up any sputum but has significant bilateral pleuritic chest pain which is worse in the axillary areas. She is markedly  fatigued and appears in mild distress.  Her laboratory work demonstrated a leukocytosis with a left shift. And the CT scan demonstrated multilobular pneumonia superimposed on lumbar fibrosis. Based on these findings I recommended starting her on Avelox which she took for 5 days. She initially had small improvement in her symptoms, but did not get substantively better. I did recommend holding her diuretic and this resulted in improved renal function. At that point I recommended she present to the hospital as she was felt to fail outpatient antibiotic therapy. She was admitted and spent several days in the hospital receiving broad-spectrum IV antibiotics with a good response. She was discharged and returned for followup with some improvement in her breathing and energy level. She does report a small amount of bilateral lower extremity edema which is dependent, probably related to the fact that we stopped her diuretic.   [name] returned and reported continued breathlessness. Unfortunately she did not have any improvement with steroids. She continues to use oxygen at night or during exertion although does feel short of breath almost all the time. She is now back on Maxzide every other day which does help with her lower extremity swelling. It should be noted that her blood pressure is elevated today and had been running in the 150 to 160 systolic range for several weeks.   Her last office visit, recommended replacing her losartan with irbesartan 300 mg daily. This is caused a marked improvement in her blood pressure which is 132/80 today.  PMHx:  Past Medical History  Diagnosis Date  . COPD (chronic obstructive pulmonary  disease)   . Hyperlipemia   . Heart murmur   . Shortness of breath     with exertion  . Asthma   . Pneumonia     hx of frequent episodes of pneumonia  . Headache(784.0)     MIgraine- Visualchanges- flashing lights and cant focus and cant see.  Marland Kitchen GERD (gastroesophageal reflux  disease)   . Arthritis   . Diverticulitis 2008  . Pulmonary fibrosis   . Hypertension   . Cirrhosis of liver not due to alcohol   . Hypothyroidism     Past Surgical History  Procedure Laterality Date  . Eye surgery  05/2008    - bilateral  . Back surgery    . Total knee arthroplasty  11/2008  . Rotator cuff repair  2008    right  . Retinal tear repair cryotherapy    . Tonsillectomy    . Appendectomy    . Dilation and curettage of uterus    . Total knee arthroplasty  07/03/2011    Procedure: TOTAL KNEE ARTHROPLASTY;  Surgeon: Valeria Batman, MD;  Location: [facility] OR;  Service: Orthopedics;  Laterality: Right;  . Transthoracic echocardiogram  04/12/2011    EF>55%; mod concentric LVH; mild-mod TR  . Varicose vein surgery      FAMHx:  Family History  Problem Relation Age of Onset  . Multiple sclerosis Sister   . Diabetes Sister   . Heart disease Sister   . Heart attack Brother   . Stroke Maternal Grandmother   . Hypertension Sister   . Hyperlipidemia Sister   . Stroke Sister     SOCHx:   reports that she quit smoking about 66 years ago. She has never used smokeless tobacco. She reports that she does not drink alcohol or use illicit drugs.  ALLERGIES:  No Known Allergies  ROS: A comprehensive review of systems was negative except for: Constitutional: positive for chills, fatigue, fevers and malaise Respiratory: positive for dyspnea on exertion and emphysema  HOME MEDS: Current Outpatient Prescriptions  Medication Sig Dispense Refill  . acetaminophen (TYLENOL) 325 MG tablet Take 2 tablets (650 mg total) by mouth every 6 (six) hours as needed.      Marland Kitchen albuterol (PROVENTIL HFA;VENTOLIN HFA) 108 (90 BASE) MCG/ACT inhaler Inhale 2 puffs into the lungs every 6 (six) hours as needed. For shortness of breath      . albuterol (PROVENTIL) (5 MG/ML) 0.5% nebulizer solution Take 0.5 mL (2.5 mg total) by nebulization every 6 (six) hours as needed for wheezing.  20 mL  12  . aspirin  81 MG tablet Take 81 mg by mouth daily.      . calcium carbonate (OS-CAL) 600 MG TABS tablet Take 600 mg by mouth daily with breakfast.      . Cholecalciferol (VITAMIN D-3 PO) Take 2,000 Units by mouth daily.       . diphenhydramine-acetaminophen (TYLENOL PM) 25-500 MG TABS Take 1 tablet by mouth at bedtime as needed.      . Glucosamine-Chondroitin (GLUCOSAMINE CHONDR COMPLEX PO) Take by mouth daily.      . irbesartan (AVAPRO) 300 MG tablet Take 1 tablet (300 mg total) by mouth daily.  30 tablet  4  . levothyroxine (SYNTHROID, LEVOTHROID) 88 MCG tablet Take 88 mcg by mouth daily before breakfast.      . magnesium oxide (MAG-OX) 400 MG tablet Take 400 mg by mouth 2 (two) times daily.       . meloxicam (MOBIC) 15 MG tablet  Take 1 tablet by mouth daily.      . methocarbamol (ROBAXIN) 500 MG tablet Take 500 mg by mouth 3 (three) times daily as needed (for muscle spasms).       Marland Kitchen. omeprazole (PRILOSEC) 20 MG capsule Take 30- 60 min before your first and last meals of the day      . Polyethyl Glycol-Propyl Glycol (SYSTANE OP) Place 1 drop into both eyes 3 (three) times daily as needed. For dry eyes      . traMADol (ULTRAM) 50 MG tablet Take 50 mg by mouth every 6 (six) hours as needed for pain.      Marland Kitchen. triamterene-hydrochlorothiazide (MAXZIDE-25) 37.5-25 MG per tablet Take 1 tablet by mouth every other day.        No current facility-administered medications for this visit.    LABS/IMAGING: No results found for this or any previous visit (from the past 48 hour(s)). No results found.  VITALS: BP 132/80  Pulse 105  Ht 5\' 3"  (1.6 m)  Wt 117 lb 11.2 oz (53.388 kg)  BMI 20.85 kg/m2  EXAM: (Deferred today) General appearance:  Neck:  Lungs:  Heart:  Abdomen:  Extremities: Pulses: Skin:  Neurologic:  EKG: Deferred  ASSESSMENT: 1. COPD and pulmonary fibrosis - s/p hospitalization and IV antibiotics for pneumonia 2. HTN-controlled 3. Palpitations  PLAN: 1.   [name] continues to be  breathless and I suspect this is due to chronic respiratory failure and pulmonary fibrosis. She does report a more productive, clear cough today. Her blood pressure is now well controlled on Avapro. She is reporting palpitations and is noted to have some PVCs on her EKG today. I've asked her to use her nebulizer if she can sparingly and consider discussing with her pulmonologist about a prescription for Xopenex. No further medication changes are recommended. She should follow closely with her pulmonologist.  Followup with me in 6 months.  Chrystie NoseKenneth C. Hilty, MD, [facility]FACC Attending Cardiologist [facility]  HILTY,Kenneth C 04/14/2013, 7:35 PM

## 2013-04-14 NOTE — Patient Instructions (Signed)
Discuss with your pulmonologist about xopenex because of frequent palpitations.   Your physician wants you to follow-up in: 6 months. You will receive a reminder letter in the mail two months in advance. If you don't receive a letter, please call our office to schedule the follow-up appointment.

## 2013-04-27 IMAGING — CR DG CHEST 2V
3 series · 3 of 3 positions shown · non-contrast
Comparison: Chest x-ray 03/14/2011.

CLINICAL DATA: Preoperative evaluation.

CHEST - 2 VIEW

[w chest lat (1 of 2)]
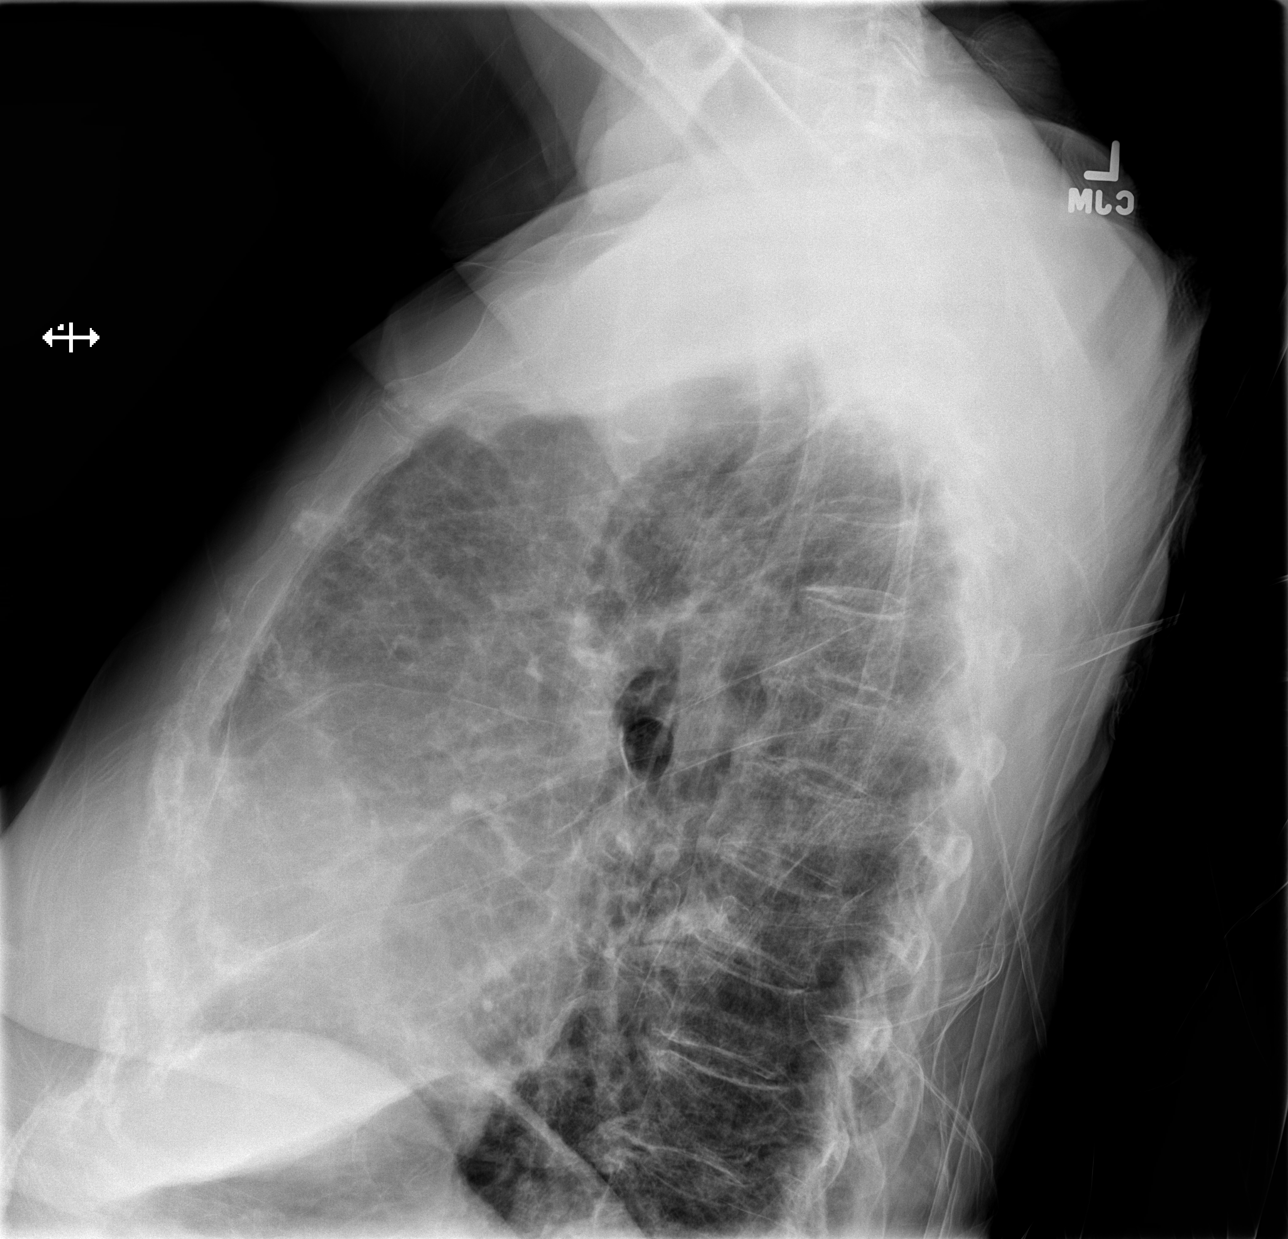

[w chest lat (2 of 2)]
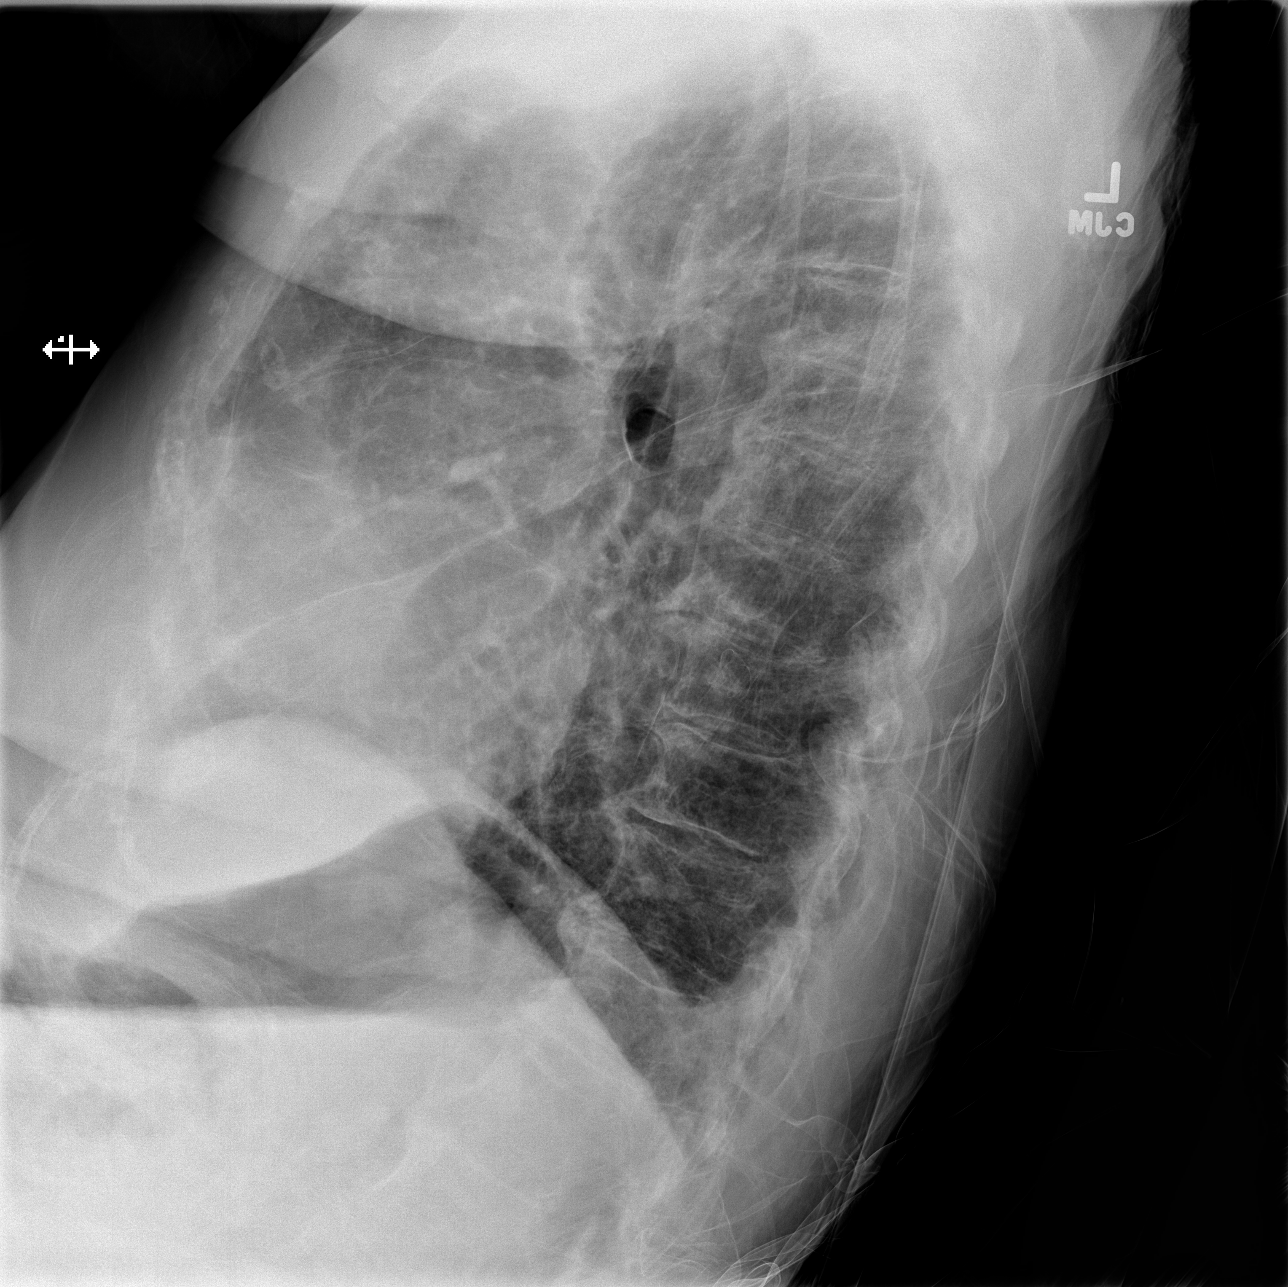

[x chest ap]
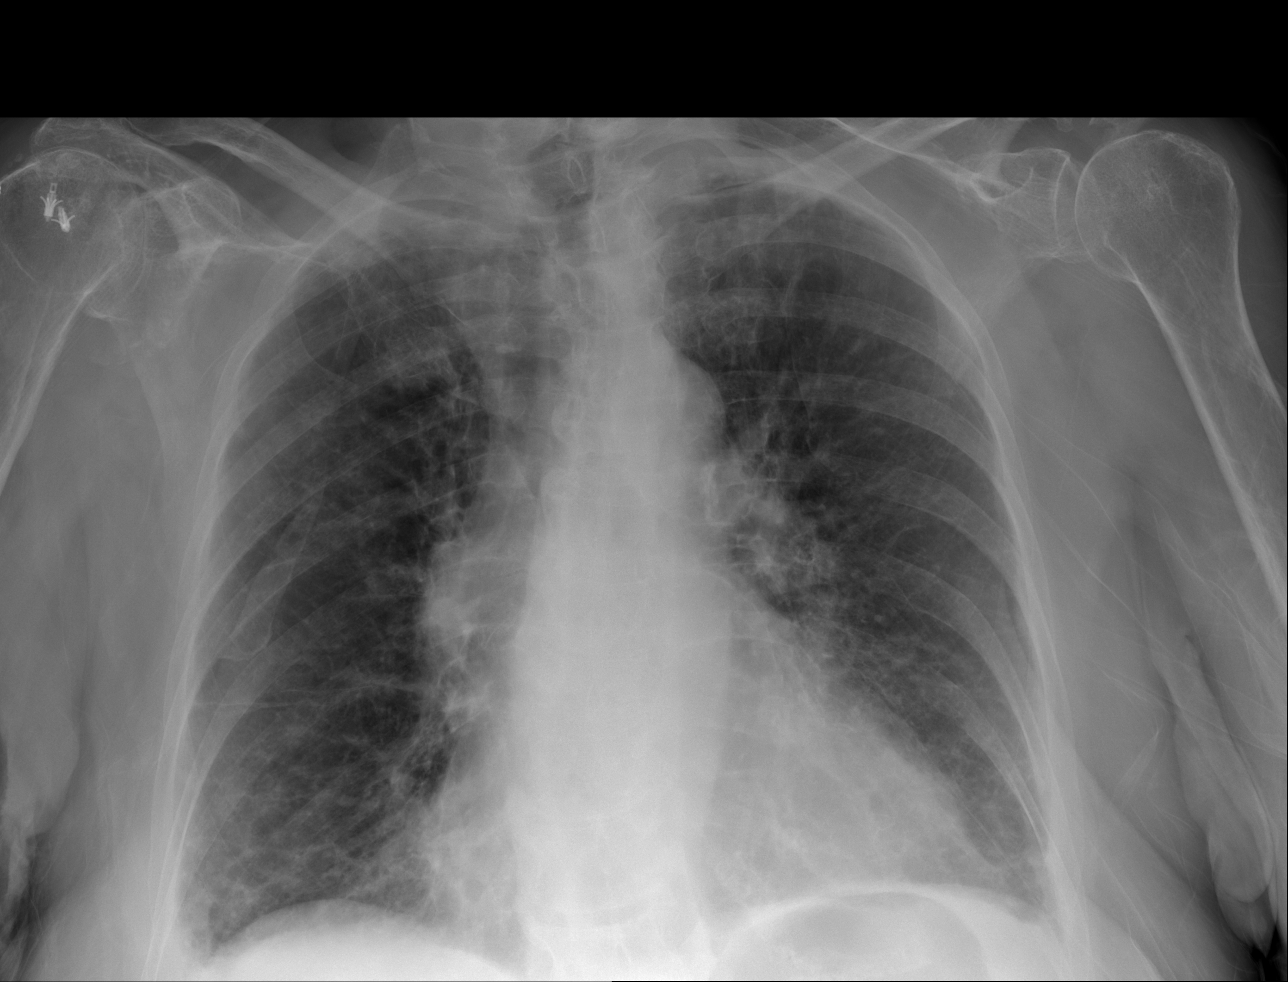

[3 of 3 positions shown; findings below may reference images not displayed]

FINDINGS: Mild diffuse interstitial prominence is again noted, most
severe throughout the periphery of the lung bases, which could
suggest underlying interstitial lung disease.  No acute
consolidative airspace disease.  Minimal blunting of the left
costophrenic sulcus may suggest presence of a left-sided pleural
effusion. No evidence of pulmonary edema.  Heart size is mildly
enlarged. The patient is rotated to the right on today's exam,
resulting in distortion of the mediastinal contours and reduced
diagnostic sensitivity and specificity for mediastinal pathology.
Atherosclerotic calcifications within the arch of the aorta.
Extensive bilateral apical pleuroparenchymal thickening is similar
to prior examinations, most likely representative of chronic
scarring.
IMPRESSION: 1.  Trace left-sided pleural effusion is new compared to the prior
examination 03/14/2011.
2.  Background of chronic interstitial prominence in the lungs, as
above, suggestive of an underlying interstitial lung disease.  This
could be better evaluated with non emergent high resolution CT of
the thorax if clinically indicated.
3.  Mild cardiomegaly.
4.  Atherosclerosis.

## 2013-04-29 ENCOUNTER — Ambulatory Visit (HOSPITAL_COMMUNITY): Payer: Medicare Other

## 2013-04-29 ENCOUNTER — Other Ambulatory Visit (INDEPENDENT_AMBULATORY_CARE_PROVIDER_SITE_OTHER): Payer: Medicare Other

## 2013-04-29 ENCOUNTER — Other Ambulatory Visit: Payer: Self-pay | Admitting: Internal Medicine

## 2013-04-29 ENCOUNTER — Ambulatory Visit (INDEPENDENT_AMBULATORY_CARE_PROVIDER_SITE_OTHER)
Admission: RE | Admit: 2013-04-29 | Discharge: 2013-04-29 | Disposition: A | Discharge status: Home / Self Care | Payer: Medicare Other | Source: Ambulatory Visit | Attending: Internal Medicine | Admitting: Internal Medicine

## 2013-04-29 ENCOUNTER — Ambulatory Visit (INDEPENDENT_AMBULATORY_CARE_PROVIDER_SITE_OTHER): Payer: Medicare Other | Admitting: Internal Medicine

## 2013-04-29 ENCOUNTER — Encounter: Payer: Self-pay | Admitting: Internal Medicine

## 2013-04-29 VITALS — BP 176/102 | HR 95 | Temp 98.7°F | Ht 63.0 in | Wt 119.6 lb

## 2013-04-29 DIAGNOSIS — I10 Essential (primary) hypertension: Secondary | ICD-10-CM

## 2013-04-29 DIAGNOSIS — R0609 Other forms of dyspnea: Secondary | ICD-10-CM

## 2013-04-29 DIAGNOSIS — J8410 Pulmonary fibrosis, unspecified: Secondary | ICD-10-CM

## 2013-04-29 DIAGNOSIS — J9610 Chronic respiratory failure, unspecified whether with hypoxia or hypercapnia: Secondary | ICD-10-CM

## 2013-04-29 DIAGNOSIS — R0989 Other specified symptoms and signs involving the circulatory and respiratory systems: Principal | ICD-10-CM

## 2013-04-29 DIAGNOSIS — R0600 Dyspnea, unspecified: Secondary | ICD-10-CM | POA: Insufficient documentation

## 2013-04-29 LAB — CBC WITH DIFFERENTIAL/PLATELET
Basophils Absolute: 0.1 10*3/uL (ref 0.0–0.1)
Basophils Relative: 0.7 % (ref 0.0–3.0)
EOS ABS: 0 10*3/uL (ref 0.0–0.7)
Eosinophils Relative: 0 % (ref 0.0–5.0)
HEMATOCRIT: 37 % (ref 36.0–46.0)
Hemoglobin: 12.2 g/dL (ref 12.0–15.0)
LYMPHS ABS: 1.5 10*3/uL (ref 0.7–4.0)
Lymphocytes Relative: 17.9 % (ref 12.0–46.0)
MCHC: 32.9 g/dL (ref 30.0–36.0)
MCV: 88.3 fl (ref 78.0–100.0)
MONO ABS: 0.7 10*3/uL (ref 0.1–1.0)
Monocytes Relative: 8.6 % (ref 3.0–12.0)
Neutro Abs: 6.3 10*3/uL (ref 1.4–7.7)
Neutrophils Relative %: 72.8 % (ref 43.0–77.0)
Platelets: 267 10*3/uL (ref 150.0–400.0)
RBC: 4.19 Mil/uL (ref 3.87–5.11)
RDW: 14.7 % — ABNORMAL HIGH (ref 11.5–14.6)
WBC: 8.6 10*3/uL (ref 4.5–10.5)

## 2013-04-29 LAB — SEDIMENTATION RATE: Sed Rate: 44 mm/hr — ABNORMAL HIGH (ref 0–22)

## 2013-04-29 LAB — BASIC METABOLIC PANEL
BUN: 30 mg/dL — AB (ref 6–23)
CALCIUM: 9.8 mg/dL (ref 8.4–10.5)
CO2: 32 mEq/L (ref 19–32)
Chloride: 100 mEq/L (ref 96–112)
Creatinine, Ser: 1.1 mg/dL (ref 0.4–1.2)
GFR: 50.26 mL/min — ABNORMAL LOW (ref >60.00)
GLUCOSE: 107 mg/dL — AB (ref 70–99)
Potassium: 4.8 mEq/L (ref 3.5–5.1)
SODIUM: 139 meq/L (ref 135–145)

## 2013-04-29 LAB — TSH: TSH: 1.27 u[IU]/mL (ref 0.35–5.50)

## 2013-04-29 LAB — BRAIN NATRIURETIC PEPTIDE: PRO B NATRI PEPTIDE: 193 pg/mL — AB (ref 0.0–100.0)

## 2013-04-29 MED ORDER — TRAMADOL HCL (ER BIPHASIC) 100 MG PO TB24: ORAL_TABLET | ORAL | Status: DC

## 2013-04-29 MED ORDER — NEBIVOLOL HCL 5 MG PO TABS: 5.0000 mg | ORAL_TABLET | Freq: Every day | ORAL | Status: DC

## 2013-04-29 NOTE — Progress Notes (Signed)
Subjective:     Patient ID: Diana GraffDorothy S Bauer, female   DOB: 12-Sep-1927   MRN: [id number]     Primary is [name] with [name]    Brief patient profile:   86 yowf never sign smoker followed by [name] remotely with h/o recurrent pna as infant and extending into adulthood as "freq bronchitis" then middle age seemed less freq, less severe and then wose x 2011/12 but never on 02 24/7 until p hosp for an illness that started in May 2014 and resulted in hosp [facility] with the following d/c summary :    Admit date: 08/23/2012  Discharge date: 08/25/2012  Discharge Diagnoses:  CAP (community acquired pneumonia)  COPD (chronic obstructive pulmonary disease)  Chronic respiratory failure  GERD (gastroesophageal reflux disease)  Cirrhosis  Discharge Condition: much improved. Stable  Diet recommendation: regular diet as tolerated.  Filed Weights    08/23/12 1723   Weight:  56.6 kg (124 lb 12.5 oz)   History of present illness at the time of admission:  This is an 80 year old female, with known history of COPD, bronchial asthma, pulmonary fibrosis, on home O2, dyslipidemia,, hypothyroidism, HTN, GERD, Migraines, OA s/p back surgery, bilateral knee replacements and rotator cuff surgery, Liver cirrhosis, diverticulosis, chronic left foot wound, seen by Dr Zoila ShutterKenneth Hilty on 08/15/12, for worsening SOB, fevers up to 101, and chills. He arranged a chest CT scan, and on 08/16/12, called in a 5-day course of oral Avelox, which she was compliant with. Chest CT scan was done on 08/18/12, and showed multiple areas of airspace consolidation involving all lobes of both lungs, most likely due to multilobar pneumonia. Patient completed her antibiotics on 08/20/12, but because of persistent symptoms and worsening tenacious cough, she called Dr Blanchie DessertHilty's office, and was referred to the ED today. Patient complains of bilateral lower rib cage pains on deep inspiration and coughing.  Hospital Course:  CAP  Patient was treated with  triple abx (vanc/rocephin/azithro) from 8/16 - 8/18.  She improved quickly. At the time of discharge she is ambulating on   2 liters of oxygen maintaining oxygen sats of 94%. It is felt that her quick recovery is more due to steroid therapy than antibiotic therapy. She will be discharged on Levaquin for 5 more days.  Acute on Chronic Respiratory Failure  She will be discharged on a slow prednisone taper in addition to spiriva and PRN nebulizers  Pulmonary follow up has been scheduled for pulmonary fibrosis  09/05/2012 f/u ov/Pedro Whiters re PF Chief Complaint  Patient presents with  . HFU    Pt states that her breathing is unchanged since hospital d/c. No new co's today.   doe x walk to road and back on RA sat 90 on return, hoarse chronically with dry mouth and eyes, worse dry mouth on spiriva. rec Stop the fish oil and spiriva I recommend you come off the hormone therapy> d/c d Increase prilosec to Take 30- 60 min before your first and last meals of the day  Goal of oxygen is keeping your oxygen levels above 90%  GERD diet    10/08/2012 f/u ov/Brandun Pinn re:  Pf ? ALI ? BOOP no flare off steroids Chief Complaint  Patient presents with  . Follow-up with PFT    Breathing is unchanged. No new co's today. Using albuterol HFA twice daily adn albuterol neb once per day.  Doing better esp walking grocery cart on 02.   rec 02 use 2lpm at bedtime and only as needed  for breathing difficulty during the day Only use your albuterol as a rescue medication     04/29/2013 f/u ov/Creedence Heiss re: PF Chief Complaint  Patient presents with  . Follow-up    hurts to take a deep breathe, c/o increase SOB, wheezing and chest tx constantly, prod cough foamy phlem.     Nl using 02 2lpm with grocery (not always), no longer interest in walking to road (prev "measuring stick for activity tolerance)  due to bad knee  Rarely uses neb now due to fear of fast heart rate  gen ant chest discomfort with cough/ better when she does  use saba  No obvious daytime variabilty or assoc excess mucus or cp or chest tightness, subjective wheeze overt sinus or hb symptoms. No unusual exp hx or h/o childhood asthma or knowledge of premature birth.   Sleeping ok without nocturnal  or early am exacerbation  of respiratory  c/o's or need for noct saba. Also denies any obvious fluctuation of symptoms with weather or environmental changes or other aggravating or alleviating factors except as outlined above   Current Medications, Allergies, Past Medical History, Past Surgical History, Family History, and Social History were reviewed in [facility] electronic medical record.  ROS  The following are not active complaints unless bolded sore throat, dysphagia, dental problems, itching, sneezing,  nasal congestion or excess/ purulent secretions, ear ache,   fever, chills, sweats, unintended wt loss, lateralizing pleuritic or exertional cp, hemoptysis,  orthopnea pnd or leg swelling, presyncope, palpitations, heartburn, abdominal pain, anorexia, nausea, vomiting, diarrhea  or change in bowel or urinary habits, change in stools or urine, dysuria,hematuria,  rash, arthralgias, visual complaints, headache, numbness weakness or ataxia or problems with walking or coordination,  change in mood/affect or memory.              Objective:   Physical Exam   Chronically ill  Hoarse amb wf nad walking with cane but able to get up on table s assistance  01/05/2013     122  04/29/2013  120  Wt Readings from Last 3 Encounters:  11/19/12 126 lb (57.153 kg)  10/08/12 128 lb (58.06 kg)  09/05/12 125 lb 9.6 oz (56.972 kg)      HEENT: nl dentition, turbinates, and orophanx. Nl external ear canals without cough reflex   NECK :  without JVD/Nodes/TM/ nl carotid upstrokes bilaterally   LUNGS: no acc muscle use -  insp crackles post bilat,  No exp rhonchi   CV:  RRR  no s3 or murmur or increase in P2, no edema   ABD:  soft and nontender with nl  excursion in the supine position. No bruits or organomegaly, bowel sounds nl  MS:  warm without deformities, calf tenderness, cyanosis or clubbing  SKIN: warm and dry without lesions     Lab Results  Component Value Date   PROBNP 193.0* 04/29/2013      Lab Results  Component Value Date   ESRSEDRATE 44* 04/29/2013   ESRSEDRATE 42* 01/05/2013   ESRSEDRATE 45* 11/19/2012     Recent Labs Lab 04/29/13 1441  NA 139  K 4.8  CL 100  CO2 32  BUN 30*  CREATININE 1.1  GLUCOSE 107*    Recent Labs Lab 04/29/13 1441  HGB 12.2  HCT 37.0  WBC 8.6  PLT 267.0     Lab Results  Component Value Date   TSH 1.27 04/29/2013          04/29/13  CXR  Again noted hyperinflation and chronic fibrotic changes. No definite  superimposed infiltrate.   Assessment:

## 2013-04-29 NOTE — Patient Instructions (Addendum)
Please remember to go to the lab and x-ray department downstairs for your tests - we will call you with the results when they are available.  bystolic 5 mg daily   Try tramadol 100mg  every 4 hour as needed for cough or pain  For nasal drainage/ throat tickle  take chlortrimeton (chlorpheniramine) 4 mg every 4 hours available over the counter (may cause drowsiness)   Please schedule a follow up office visit in 4-6 weeks, call sooner if needed pfts bring all medications including over the counters (call in meantime if they don't match up completely)

## 2013-04-30 ENCOUNTER — Telehealth: Payer: Self-pay | Admitting: Internal Medicine

## 2013-04-30 MED ORDER — TRAMADOL HCL 50 MG PO TABS: ORAL_TABLET | ORAL | Status: DC

## 2013-04-30 NOTE — Telephone Encounter (Signed)
I intended the immediate release so if not avail in the 100 mg strength then give her the 50 x 2 to equal same total amt the 100 would have provided here  (ie double the number)

## 2013-04-30 NOTE — Progress Notes (Signed)
Quick Note:  Spoke with pt and notified of results per Dr. Wert. Pt verbalized understanding and denied any questions.  ______ 

## 2013-04-30 NOTE — Progress Notes (Signed)
Quick Note:  LMTCB ______ 

## 2013-04-30 NOTE — Telephone Encounter (Signed)
Called spoke w/ Jessice-[facility]. She reports we wrote RX for tramadol 100 mg 24 hr 1 tab q4hrs prn.  She reports it is only extended release. She reports we can call in tramadol 50 mg 2 po q4hrs prn. Please advise MW thanks

## 2013-04-30 NOTE — Telephone Encounter (Signed)
Called the pharmacy and given the VO for tramadol 50 mg  2 po every 4 hours as needed for pain or cough  #40 with no refills.  Nothing further is needed.

## 2013-05-02 NOTE — Assessment & Plan Note (Signed)
Strongly prefer in this setting: Bystolic, the most beta -1  selective Beta blocker available in sample form, with bisoprolol the most selective generic choice  on the market.  

## 2013-05-02 NOTE — Assessment & Plan Note (Signed)
-   09/05/12 reported amb off 02 with sats > 90 to mailbox and back  - 10/08/2012   Walked RA x 2 laps fast pace @ 185 stopped due to  Legs gave out with sats 94% - 11/19/2012  Walked RA  2 laps @ 185 ft each stopped due to  Weak legs - 04/29/2013  Walked RA  2 laps @ 185 ft each stopped due to fatigue, no sob or desat  Adequate control on present rx, reviewed > no change in rx needed

## 2013-05-02 NOTE — Assessment & Plan Note (Signed)
Multifactorial  See instructions for specific recommendations which were reviewed directly with the patient who was given a copy with highlighter outlining the key components.

## 2013-05-02 NOTE — Assessment & Plan Note (Addendum)
-  off prednisone since around 09/08/2012  - 10/08/2012 PFTs VC  1.45 s obst and DLCO 56 corrects to 111 - 01/05/2013  PFTs VC  1.44 s obst dlco 62 correccts to 123  - ESR 10/08/2012 = 59  - down to 45 11/19/12 and 42 12/291/4 > no change 04/29/13   No evidence of any progression or inflammatory component  Ok to use neb prn but will need a specific BB to prevent excess HR response esp since already hypertensive

## 2013-05-19 ENCOUNTER — Other Ambulatory Visit: Payer: Self-pay | Admitting: Family Medicine

## 2013-05-19 DIAGNOSIS — K7460 Unspecified cirrhosis of liver: Secondary | ICD-10-CM

## 2013-05-22 ENCOUNTER — Ambulatory Visit
Admission: RE | Admit: 2013-05-22 | Discharge: 2013-05-22 | Disposition: A | Discharge status: Home / Self Care | Payer: Medicare Other | Source: Ambulatory Visit | Attending: Family Medicine | Admitting: Family Medicine

## 2013-05-22 DIAGNOSIS — K7460 Unspecified cirrhosis of liver: Secondary | ICD-10-CM

## 2013-06-02 ENCOUNTER — Ambulatory Visit (INDEPENDENT_AMBULATORY_CARE_PROVIDER_SITE_OTHER): Payer: Medicare Other | Admitting: Internal Medicine

## 2013-06-02 ENCOUNTER — Encounter: Payer: Self-pay | Admitting: Internal Medicine

## 2013-06-02 VITALS — BP 132/74 | HR 75 | Temp 98.2°F | Ht 63.0 in | Wt 114.0 lb

## 2013-06-02 DIAGNOSIS — J9610 Chronic respiratory failure, unspecified whether with hypoxia or hypercapnia: Secondary | ICD-10-CM

## 2013-06-02 DIAGNOSIS — J8410 Pulmonary fibrosis, unspecified: Secondary | ICD-10-CM

## 2013-06-02 DIAGNOSIS — J310 Chronic rhinitis: Secondary | ICD-10-CM

## 2013-06-02 MED ORDER — AZELASTINE-FLUTICASONE 137-50 MCG/ACT NA SUSP: 1.0000 | Freq: Two times a day (BID) | NASAL | Status: DC

## 2013-06-02 NOTE — Progress Notes (Signed)
Subjective:     Patient ID: [name], female   DOB: 12-Sep-1927   MRN: [id number]     Primary is [name] with [name]    Brief patient profile:   86 yowf never sign smoker followed by [name] remotely with h/o recurrent pna as infant and extending into adulthood as "freq bronchitis" then middle age seemed less freq, less severe and then wose x 2011/12 but never on 02 24/7 until p hosp for an illness that started in May 2014 and resulted in hosp [facility] with the following d/c summary :    Admit date: 08/23/2012  Discharge date: 08/25/2012  Discharge Diagnoses:  CAP (community acquired pneumonia)  COPD (chronic obstructive pulmonary disease)  Chronic respiratory failure  GERD (gastroesophageal reflux disease)  Cirrhosis  Discharge Condition: much improved. Stable  Diet recommendation: regular diet as tolerated.  Filed Weights    08/23/12 1723   Weight:  56.6 kg (124 lb 12.5 oz)   History of present illness at the time of admission:  This is an 80 year old female, with known history of COPD, bronchial asthma, pulmonary fibrosis, on home O2, dyslipidemia,, hypothyroidism, HTN, GERD, Migraines, OA s/p back surgery, bilateral knee replacements and rotator cuff surgery, Liver cirrhosis, diverticulosis, chronic left foot wound, seen by Dr [name] on 08/15/12, for worsening SOB, fevers up to 101, and chills. He arranged a chest CT scan, and on 08/16/12, called in a 5-day course of oral Avelox, which she was compliant with. Chest CT scan was done on 08/18/12, and showed multiple areas of airspace consolidation involving all lobes of both lungs, most likely due to multilobar pneumonia. Patient completed her antibiotics on 08/20/12, but because of persistent symptoms and worsening tenacious cough, she called Dr [name]'s office, and was referred to the ED today. Patient complains of bilateral lower rib cage pains on deep inspiration and coughing.  Hospital Course:  CAP  Patient was treated with  triple abx (vanc/rocephin/azithro) from 8/16 - 8/18.  She improved quickly. At the time of discharge she is ambulating on   2 liters of oxygen maintaining oxygen sats of 94%. It is felt that her quick recovery is more due to steroid therapy than antibiotic therapy. She will be discharged on Levaquin for 5 more days.  Acute on Chronic Respiratory Failure  She will be discharged on a slow prednisone taper in addition to spiriva and PRN nebulizers  Pulmonary follow up has been scheduled for pulmonary fibrosis  09/05/2012 f/u ov/[name] re PF Chief Complaint  Patient presents with  . HFU    Pt states that her breathing is unchanged since hospital d/c. No new co's today.   doe x walk to road and back on RA sat 90 on return, hoarse chronically with dry mouth and eyes, worse dry mouth on spiriva. rec Stop the fish oil and spiriva I recommend you come off the hormone therapy> d/c d Increase prilosec to Take 30- 60 min before your first and last meals of the day  Goal of oxygen is keeping your oxygen levels above 90%  GERD diet    10/08/2012 f/u ov/[name] re:  Pf ? ALI ? BOOP no flare off steroids Chief Complaint  Patient presents with  . Follow-up with PFT    Breathing is unchanged. No new co's today. Using albuterol HFA twice daily adn albuterol neb once per day.  Doing better esp walking grocery cart on 02.   rec 02 use 2lpm at bedtime and only as needed  for breathing difficulty during the day Only use your albuterol as a rescue medication     04/29/2013 f/u ov/[name] re: PF Chief Complaint  Patient presents with  . Follow-up    hurts to take a deep breathe, c/o increase SOB, wheezing and chest tx constantly, prod cough foamy phlem.    Nl using 02 2lpm with grocery (not always), no longer interest in walking to road (prev "measuring stick for activity tolerance)  due to bad knee  Rarely uses neb now due to fear of fast heart rate  gen ant chest discomfort with cough/ better when she does use  saba rec Please remember to go to the lab and x-ray department downstairs for your tests - we will call you with the results when they are available. bystolic 5 mg daily  Try tramadol 100mg  every 4 hour as needed for cough or pain For nasal drainage/ throat tickle  take chlortrimeton (chlorpheniramine) 4 mg every 4 hours available over the counter (may cause drowsiness)  Please schedule a follow up office visit in 4-6 weeks, call sooner if needed pfts bring all medications including over the counters (call in meantime if they don't match up completely)   06/02/2013 f/u ov/[name] re: PF  Chief Complaint  Patient presents with  . Follow-up    Pt reports her breathing is unchanged since her last visit. Chest discomfort also no better. Her cough is some better, but still c/o wheezing.    nose dripping x 6 months, only during the day , watery. "wheezing" some better p saba but rarely uses Has nose drops > not better ? NS   No obvious daytime variabilty or assoc excess mucus or cp or chest tightness, subjective wheeze overt sinus or hb symptoms. No unusual exp hx or h/o childhood asthma or knowledge of premature birth.   Sleeping ok without nocturnal  or early am exacerbation  of respiratory  c/o's or need for noct saba. Also denies any obvious fluctuation of symptoms with weather or environmental changes or other aggravating or alleviating factors except as outlined above   Current Medications, Allergies, Past Medical History, Past Surgical History, Family History, and Social History were reviewed in [name] record.  ROS  The following are not active complaints unless bolded sore throat, dysphagia, dental problems, itching, sneezing,  nasal congestion or excess/ purulent secretions, ear ache,   fever, chills, sweats, unintended wt loss, lateralizing pleuritic or exertional cp, hemoptysis,  orthopnea pnd or leg swelling, presyncope, palpitations, heartburn, abdominal pain,  anorexia, nausea, vomiting, diarrhea  or change in bowel or urinary habits, change in stools or urine, dysuria,hematuria,  rash, arthralgias, visual complaints, headache, numbness weakness or ataxia or problems with walking or coordination,  change in mood/affect or memory.              Objective:   Physical Exam   Chronically ill  Hoarse amb wf nad walking with cane but able to get up on table s assistance  01/05/2013     122  04/29/2013  120 > 06/02/2013  114  Wt Readings from Last 3 Encounters:  11/19/12 126 lb (57.153 kg)  10/08/12 128 lb (58.06 kg)  09/05/12 125 lb 9.6 oz (56.972 kg)      HEENT: nl dentition, turbinates, and orophanx. Nl external ear canals without cough reflex   NECK :  without JVD/Nodes/TM/ nl carotid upstrokes bilaterally   LUNGS: no acc muscle use -  insp crackles post bilat,  No exp  rhonchi   CV:  RRR  no s3 or murmur or increase in P2, no edema   ABD:  soft and nontender with nl excursion in the supine position. No bruits or organomegaly, bowel sounds nl  MS:  warm without deformities, calf tenderness, cyanosis or clubbing  SKIN: warm and dry without lesions     Lab Results  Component Value Date   PROBNP 193.0* 04/29/2013      Lab Results  Component Value Date   ESRSEDRATE 44* 04/29/2013   ESRSEDRATE 42* 01/05/2013   ESRSEDRATE 45* 11/19/2012     Recent Labs Lab 04/29/13 1441  NA 139  K 4.8  CL 100  CO2 32  BUN 30*  CREATININE 1.1  GLUCOSE 107*    Recent Labs Lab 04/29/13 1441  HGB 12.2  HCT 37.0  WBC 8.6  PLT 267.0     Lab Results  Component Value Date   TSH 1.27 04/29/2013          04/29/13  CXR  Again noted hyperinflation and chronic fibrotic changes. No definite  superimposed infiltrate.   Assessment:

## 2013-06-02 NOTE — Patient Instructions (Addendum)
Stop chlortrimeton   Try dymista one twice daily each nostil and fill prescription and if not see your primary regarding referred to ENT  Continue prilosec 20 mg Take 30- 60 min before your first and last meals of the day   Only use your albuterol as a rescue medication to be used if you can't catch your breath by resting or doing a relaxed purse lip breathing pattern.  - The less you use it, the better it will work when you need it. - Ok to use up to  every 4 hours if you must   Return 10/08/13 for pfts/cxr - call sooner if feel breathing or coughing are worse - bring all medications including over the counter

## 2013-06-03 DIAGNOSIS — J310 Chronic rhinitis: Secondary | ICD-10-CM | POA: Insufficient documentation

## 2013-06-03 NOTE — Assessment & Plan Note (Signed)
-  off prednisone since around 09/08/2012  - 10/08/2012 PFTs VC  1.45 s obst and DLCO 56 corrects to 111 - 01/05/2013  PFTs VC  1.44 s obst dlco 62 correccts to 123  - ESR 10/08/2012 = 59  - down to 45 11/19/12 and 42 12/291/4 > no change 04/29/13   No evidence of dz progression nor is it likely the cough is related to the PF which likely represents response to remote insult or ALI

## 2013-06-03 NOTE — Assessment & Plan Note (Signed)
-   09/05/12 reported amb off 02 with sats > 90 to mailbox and back  - 10/08/2012   Walked RA x 2 laps fast pace @ 185 stopped due to  Legs gave out with sats 94% - 11/19/2012  Walked RA  2 laps @ 185 ft each stopped due to  Weak legs - 04/29/2013  Walked RA  2 laps @ 185 ft each stopped due to fatigue, no sob or desat  Not limited at this point by oxygenation

## 2013-06-03 NOTE — Assessment & Plan Note (Signed)
Features are non-specific and day >> night and no better with previous nose drops which I believe were mostly NS so try dymista one bid  See instructions for specific recommendations which were reviewed directly with the patient who was given a copy with highlighter outlining the key components.

## 2013-06-11 ENCOUNTER — Encounter: Payer: Self-pay | Admitting: Internal Medicine

## 2013-06-17 ENCOUNTER — Telehealth: Payer: Self-pay | Admitting: Internal Medicine

## 2013-06-17 MED ORDER — AZELASTINE-FLUTICASONE 137-50 MCG/ACT NA SUSP: 1.0000 | Freq: Two times a day (BID) | NASAL | Status: DC

## 2013-06-17 NOTE — Telephone Encounter (Signed)
I called spoke with pt. Aware RX has been sent. Nothing further needed  

## 2013-08-17 ENCOUNTER — Emergency Department (HOSPITAL_COMMUNITY): Payer: Medicare Other

## 2013-08-17 ENCOUNTER — Encounter (HOSPITAL_COMMUNITY): Payer: Self-pay | Admitting: Emergency Medicine

## 2013-08-17 ENCOUNTER — Inpatient Hospital Stay (HOSPITAL_COMMUNITY)
Admission: EM | Admit: 2013-08-17 | Discharge: 2013-08-20 | DRG: 377 | Disposition: A | Discharge status: Home / Self Care | Payer: Medicare Other | Attending: Internal Medicine | Admitting: Internal Medicine

## 2013-08-17 DIAGNOSIS — J449 Chronic obstructive pulmonary disease, unspecified: Secondary | ICD-10-CM | POA: Diagnosis present

## 2013-08-17 DIAGNOSIS — J4489 Other specified chronic obstructive pulmonary disease: Secondary | ICD-10-CM | POA: Diagnosis present

## 2013-08-17 DIAGNOSIS — E785 Hyperlipidemia, unspecified: Secondary | ICD-10-CM | POA: Diagnosis present

## 2013-08-17 DIAGNOSIS — J9610 Chronic respiratory failure, unspecified whether with hypoxia or hypercapnia: Secondary | ICD-10-CM | POA: Diagnosis present

## 2013-08-17 DIAGNOSIS — E46 Unspecified protein-calorie malnutrition: Secondary | ICD-10-CM

## 2013-08-17 DIAGNOSIS — J9611 Chronic respiratory failure with hypoxia: Secondary | ICD-10-CM

## 2013-08-17 DIAGNOSIS — K5791 Diverticulosis of intestine, part unspecified, without perforation or abscess with bleeding: Secondary | ICD-10-CM

## 2013-08-17 DIAGNOSIS — Z87891 Personal history of nicotine dependence: Secondary | ICD-10-CM

## 2013-08-17 DIAGNOSIS — K5731 Diverticulosis of large intestine without perforation or abscess with bleeding: Principal | ICD-10-CM

## 2013-08-17 DIAGNOSIS — J189 Pneumonia, unspecified organism: Secondary | ICD-10-CM

## 2013-08-17 DIAGNOSIS — E039 Hypothyroidism, unspecified: Secondary | ICD-10-CM | POA: Diagnosis present

## 2013-08-17 DIAGNOSIS — Z9981 Dependence on supplemental oxygen: Secondary | ICD-10-CM

## 2013-08-17 DIAGNOSIS — R002 Palpitations: Secondary | ICD-10-CM

## 2013-08-17 DIAGNOSIS — D500 Iron deficiency anemia secondary to blood loss (chronic): Secondary | ICD-10-CM | POA: Diagnosis present

## 2013-08-17 DIAGNOSIS — Z96659 Presence of unspecified artificial knee joint: Secondary | ICD-10-CM

## 2013-08-17 DIAGNOSIS — D649 Anemia, unspecified: Secondary | ICD-10-CM

## 2013-08-17 DIAGNOSIS — Z66 Do not resuscitate: Secondary | ICD-10-CM | POA: Diagnosis present

## 2013-08-17 DIAGNOSIS — Z79899 Other long term (current) drug therapy: Secondary | ICD-10-CM

## 2013-08-17 DIAGNOSIS — K219 Gastro-esophageal reflux disease without esophagitis: Secondary | ICD-10-CM | POA: Diagnosis present

## 2013-08-17 DIAGNOSIS — I10 Essential (primary) hypertension: Secondary | ICD-10-CM | POA: Diagnosis present

## 2013-08-17 DIAGNOSIS — K922 Gastrointestinal hemorrhage, unspecified: Secondary | ICD-10-CM | POA: Diagnosis present

## 2013-08-17 DIAGNOSIS — N179 Acute kidney failure, unspecified: Secondary | ICD-10-CM

## 2013-08-17 DIAGNOSIS — E43 Unspecified severe protein-calorie malnutrition: Secondary | ICD-10-CM

## 2013-08-17 DIAGNOSIS — D62 Acute posthemorrhagic anemia: Secondary | ICD-10-CM

## 2013-08-17 DIAGNOSIS — R9389 Abnormal findings on diagnostic imaging of other specified body structures: Secondary | ICD-10-CM

## 2013-08-17 DIAGNOSIS — Z7982 Long term (current) use of aspirin: Secondary | ICD-10-CM

## 2013-08-17 DIAGNOSIS — K921 Melena: Secondary | ICD-10-CM

## 2013-08-17 DIAGNOSIS — K7460 Unspecified cirrhosis of liver: Secondary | ICD-10-CM

## 2013-08-17 DIAGNOSIS — J310 Chronic rhinitis: Secondary | ICD-10-CM

## 2013-08-17 DIAGNOSIS — IMO0002 Reserved for concepts with insufficient information to code with codable children: Secondary | ICD-10-CM

## 2013-08-17 DIAGNOSIS — J8410 Pulmonary fibrosis, unspecified: Secondary | ICD-10-CM

## 2013-08-17 DIAGNOSIS — R0600 Dyspnea, unspecified: Secondary | ICD-10-CM

## 2013-08-17 HISTORY — DX: Dependence on supplemental oxygen: Z99.81

## 2013-08-17 LAB — CBC WITH DIFFERENTIAL/PLATELET
BASOS ABS: 0 10*3/uL (ref 0.0–0.1)
BASOS PCT: 1 % (ref 0–1)
EOS PCT: 0 % (ref 0–5)
Eosinophils Absolute: 0 10*3/uL (ref 0.0–0.7)
HEMATOCRIT: 33.4 % — AB (ref 36.0–46.0)
Hemoglobin: 10.4 g/dL — ABNORMAL LOW (ref 12.0–15.0)
Lymphocytes Relative: 14 % (ref 12–46)
Lymphs Abs: 1.1 10*3/uL (ref 0.7–4.0)
MCH: 28.3 pg (ref 26.0–34.0)
MCHC: 31.1 g/dL (ref 30.0–36.0)
MCV: 91 fL (ref 78.0–100.0)
Monocytes Absolute: 0.5 10*3/uL (ref 0.1–1.0)
Monocytes Relative: 7 % (ref 3–12)
NEUTROS ABS: 6.1 10*3/uL (ref 1.7–7.7)
Neutrophils Relative %: 78 % — ABNORMAL HIGH (ref 43–77)
PLATELETS: 300 10*3/uL (ref 150–400)
RBC: 3.67 MIL/uL — ABNORMAL LOW (ref 3.87–5.11)
RDW: 14.5 % (ref 11.5–15.5)
WBC: 7.7 10*3/uL (ref 4.0–10.5)

## 2013-08-17 LAB — COMPREHENSIVE METABOLIC PANEL
ALBUMIN: 3.7 g/dL (ref 3.5–5.2)
ALT: 20 U/L (ref 0–35)
AST: 28 U/L (ref 0–37)
Alkaline Phosphatase: 178 U/L — ABNORMAL HIGH (ref 39–117)
Anion gap: 12 (ref 5–15)
BUN: 40 mg/dL — ABNORMAL HIGH (ref 6–23)
CALCIUM: 9.8 mg/dL (ref 8.4–10.5)
CO2: 27 mEq/L (ref 19–32)
Chloride: 104 mEq/L (ref 96–112)
Creatinine, Ser: 1.23 mg/dL — ABNORMAL HIGH (ref 0.50–1.10)
GFR calc Af Amer: 45 mL/min — ABNORMAL LOW (ref >90)
GFR calc non Af Amer: 39 mL/min — ABNORMAL LOW (ref >90)
Glucose, Bld: 112 mg/dL — ABNORMAL HIGH (ref 70–99)
Potassium: 5 mEq/L (ref 3.7–5.3)
Sodium: 143 mEq/L (ref 137–147)
TOTAL PROTEIN: 7.2 g/dL (ref 6.0–8.3)
Total Bilirubin: 0.4 mg/dL (ref 0.3–1.2)

## 2013-08-17 LAB — PROTIME-INR
INR: 0.98 (ref 0.00–1.49)
Prothrombin Time: 13 seconds (ref 11.6–15.2)

## 2013-08-17 LAB — LIPASE, BLOOD: LIPASE: 47 U/L (ref 11–59)

## 2013-08-17 LAB — POC OCCULT BLOOD, ED: Fecal Occult Bld: POSITIVE — AB

## 2013-08-17 LAB — LACTIC ACID, PLASMA: Lactic Acid, Venous: 1.1 mmol/L (ref 0.5–2.2)

## 2013-08-17 LAB — PRO B NATRIURETIC PEPTIDE: PRO B NATRI PEPTIDE: 332.5 pg/mL (ref 0–450)

## 2013-08-17 MED ORDER — SODIUM CHLORIDE 0.9 % IV SOLN
INTRAVENOUS | Status: DC
  Administered 2013-08-17 – 2013-08-20 (×4): via INTRAVENOUS

## 2013-08-17 MED ORDER — ONDANSETRON HCL 4 MG PO TABS: 4.0000 mg | ORAL_TABLET | Freq: Four times a day (QID) | ORAL | Status: DC | PRN

## 2013-08-17 MED ORDER — CETYLPYRIDINIUM CHLORIDE 0.05 % MT LIQD
7.0000 mL | Freq: Two times a day (BID) | OROMUCOSAL | Status: DC
  Administered 2013-08-17 – 2013-08-20 (×6): 7 mL via OROMUCOSAL

## 2013-08-17 MED ORDER — LEVOTHYROXINE SODIUM 100 MCG IV SOLR
44.0000 ug | Freq: Every day | INTRAVENOUS | Status: DC
  Administered 2013-08-18: 44 ug via INTRAVENOUS
  Filled 2013-08-17 (×2): qty 5

## 2013-08-17 MED ORDER — SODIUM CHLORIDE 0.9 % IV SOLN: INTRAVENOUS | Status: DC

## 2013-08-17 MED ORDER — SODIUM CHLORIDE 0.9 % IV SOLN
INTRAVENOUS | Status: DC
  Administered 2013-08-17: 12:00:00 via INTRAVENOUS

## 2013-08-17 MED ORDER — ONDANSETRON HCL 4 MG/2ML IJ SOLN: 4.0000 mg | Freq: Four times a day (QID) | INTRAMUSCULAR | Status: DC | PRN

## 2013-08-17 MED ORDER — ALBUTEROL SULFATE (2.5 MG/3ML) 0.083% IN NEBU: 2.5000 mg | INHALATION_SOLUTION | Freq: Four times a day (QID) | RESPIRATORY_TRACT | Status: DC | PRN

## 2013-08-17 MED ORDER — IOHEXOL 300 MG/ML  SOLN
80.0000 mL | Freq: Once | INTRAMUSCULAR | Status: AC | PRN
  Administered 2013-08-17: 80 mL via INTRAVENOUS

## 2013-08-17 MED ORDER — IOHEXOL 300 MG/ML  SOLN: 25.0000 mL | INTRAMUSCULAR | Status: AC

## 2013-08-17 NOTE — ED Notes (Signed)
Report attempted 

## 2013-08-17 NOTE — ED Notes (Signed)
Pt placed on monitor upon arrival to room from restroom. Pt continues to be monitored by 5 lead, blood  Pressure, and pulse ox. Pt remains on 2L via [place].

## 2013-08-17 NOTE — ED Notes (Signed)
Called CT to make aware pt had finished contrast for CT.

## 2013-08-17 NOTE — ED Provider Notes (Signed)
CSN: [id number]     Arrival date & time 08/17/13  1028 History   First MD Initiated Contact with Patient 08/17/13 1105     Chief Complaint  Patient presents with  . Abdominal Pain  . Rectal Bleeding      HPI Pt was seen at 1105.  Per pt, c/o gradual onset and persistence of constant "bloody stools" since yesterday. Pt describes the stools as "dark" and "maroon." Stats she had "at least 7 or more" stools overnight last night. Has been associated with lower abd "pain." Pt endorses hx of same, dx diverticulitis. Denies diarrhea/constipation, no rectal pain, no N/V, no back pain, no fevers.     Past Medical History  Diagnosis Date  . COPD (chronic obstructive pulmonary disease)   . Hyperlipemia   . Heart murmur   . Shortness of breath     with exertion  . Asthma   . Pneumonia     hx of frequent episodes of pneumonia  . Headache(784.0)     MIgraine- Visualchanges- flashing lights and cant focus and cant see.  Marland Kitchen. GERD (gastroesophageal reflux disease)   . Arthritis   . Diverticulitis 2008  . Pulmonary fibrosis   . Hypertension   . Cirrhosis of liver not due to alcohol   . Hypothyroidism   . On home O2     2L N/C   Past Surgical History  Procedure Laterality Date  . Eye surgery  05/2008    - bilateral  . Back surgery    . Total knee arthroplasty  11/2008  . Rotator cuff repair  2008    right  . Retinal tear repair cryotherapy    . Tonsillectomy    . Appendectomy    . Dilation and curettage of uterus    . Total knee arthroplasty  07/03/2011    Procedure: TOTAL KNEE ARTHROPLASTY;  Surgeon: Valeria BatmanPeter W Whitfield, MD;  [facility];  Service: Orthopedics;  Laterality: Right;  . Transthoracic echocardiogram  04/12/2011    EF>55%; mod concentric LVH; mild-mod TR  . Varicose vein surgery     Family History  Problem Relation Age of Onset  . Multiple sclerosis Sister   . Diabetes Sister   . Heart disease Sister   . Heart attack Brother   . Stroke Maternal Grandmother   .  Hypertension Sister   . Hyperlipidemia Sister   . Stroke Sister    History  Substance Use Topics  . Smoking status: Former Smoker -- 1.5 years    Quit date: 01/09/1947  . Smokeless tobacco: Never Used     Comment: socially smoed x 1 1/2 years 04/29/13  . Alcohol Use: No    Review of Systems ROS: Statement: All systems negative except as marked or noted in the HPI; Constitutional: Negative for fever and chills. ; ; Eyes: Negative for eye pain, redness and discharge. ; ; ENMT: Negative for ear pain, hoarseness, nasal congestion, sinus pressure and sore throat. ; ; Cardiovascular: Negative for chest pain, palpitations, diaphoresis, dyspnea and peripheral edema. ; ; Respiratory: Negative for cough, wheezing and stridor. ; ; Gastrointestinal: Negative for nausea, vomiting, diarrhea, hematemesis, jaundice and +abd pain, +rectal bleeding. . ; ; Genitourinary: Negative for dysuria, flank pain and hematuria. ; ; Musculoskeletal: Negative for back pain and neck pain. Negative for swelling and trauma.; ; Skin: Negative for pruritus, rash, abrasions, blisters, bruising and skin lesion.; ; Neuro: Negative for headache, lightheadedness and neck stiffness. Negative for weakness, altered level of consciousness , altered  mental status, extremity weakness, paresthesias, involuntary movement, seizure and syncope.      Allergies  Review of patient's allergies indicates no known allergies.  Home Medications   Prior to Admission medications   Medication Sig Start Date End Date Taking? Authorizing Provider  albuterol (PROVENTIL HFA;VENTOLIN HFA) 108 (90 BASE) MCG/ACT inhaler Inhale 2 puffs into the lungs every 6 (six) hours as needed. For shortness of breath   Yes Historical Provider, MD  albuterol (PROVENTIL) (5 MG/ML) 0.5% nebulizer solution Take 0.5 mL (2.5 mg total) by nebulization every 6 (six) hours as needed for wheezing. 08/25/12  Yes Tora Kindred York, PA-C  aspirin 81 MG tablet Take 81 mg by mouth daily.    Yes Historical Provider, MD  Azelastine-Fluticasone (DYMISTA) 137-50 MCG/ACT SUSP Place 1 puff into the nose 2 (two) times daily. 06/17/13  Yes Nyoka Cowden, MD  calcium carbonate (OS-CAL) 600 MG TABS tablet Take 600 mg by mouth daily with lunch.    Yes Historical Provider, MD  Esomeprazole Magnesium (NEXIUM PO) Take 22.3 mg by mouth every morning. OTC dose   Yes Historical Provider, MD  Glucosamine-Chondroitin (GLUCOSAMINE CHONDR COMPLEX PO) Take 1 tablet by mouth daily.    Yes Historical Provider, MD  irbesartan (AVAPRO) 300 MG tablet Take 1 tablet (300 mg total) by mouth daily. 03/10/13  Yes Chrystie Nose, MD  levothyroxine (SYNTHROID, LEVOTHROID) 88 MCG tablet Take 88 mcg by mouth daily before breakfast.   Yes Historical Provider, MD  magnesium oxide (MAG-OX) 400 MG tablet Take 400 mg by mouth daily.    Yes Historical Provider, MD  meloxicam (MOBIC) 15 MG tablet Take 15 mg by mouth daily.   Yes Historical Provider, MD  methocarbamol (ROBAXIN) 500 MG tablet Take 500 mg by mouth 2 (two) times daily.    Yes Historical Provider, MD  naproxen sodium (ANAPROX) 220 MG tablet Take 220 mg by mouth 2 (two) times daily with a meal.   Yes Historical Provider, MD  nebivolol (BYSTOLIC) 5 MG tablet Take 1 tablet (5 mg total) by mouth daily. 04/29/13  Yes Nyoka Cowden, MD  Polyethyl Glycol-Propyl Glycol (SYSTANE OP) Place 1 drop into both eyes 4 (four) times daily as needed (dry eyes). For dry eyes   Yes Historical Provider, MD  triamterene-hydrochlorothiazide (MAXZIDE-25) 37.5-25 MG per tablet Take 1 tablet by mouth See admin instructions. No specific days. She doesn't always take it. She states she at least takes it three times weekly.   Yes Historical Provider, MD   BP 142/58  Pulse 77  Temp(Src) 97.7 F (36.5 C) (Oral)  Resp 25  SpO2 100% Physical Exam 1110: Physical examination:  Nursing notes reviewed; Vital signs and O2 SAT reviewed;  Constitutional: Well developed, Well nourished, Well  hydrated, In no acute distress; Head:  Normocephalic, atraumatic; Eyes: EOMI, PERRL, No scleral icterus; ENMT: Mouth and pharynx normal, Mucous membranes moist; Neck: Supple, Full range of motion, No lymphadenopathy; Cardiovascular: Regular rate and rhythm, No gallop; Respiratory: Breath sounds clear & equal bilaterally, No rales, rhonchi, wheezes.  Speaking full sentences with ease, Normal respiratory effort/excursion; Chest: Nontender, Movement normal; Abdomen: Soft, +mild LLQ tenderness to palp. No rebound or guarding. Nondistended, Normal bowel sounds. Rectal exam performed w/permission of pt and ED RN chaperone present.  Anal tone normal.  Non-tender, soft maroon stool in rectal vault, heme positive.  No fissures, no external hemorrhoids, no palp masses.; Genitourinary: No CVA tenderness; Extremities: Pulses normal, No tenderness, No edema, No calf edema or asymmetry.; Neuro:  AA&Ox3, Major CN grossly intact.  Speech clear. No gross focal motor or sensory deficits in extremities.; Skin: Color normal, Warm, Dry.   ED Course  Procedures     EKG Interpretation None      MDM  MDM Reviewed: previous chart, nursing note and vitals Reviewed previous: labs Interpretation: labs, x-ray and CT scan    Results for orders placed during the hospital encounter of 08/17/13  CBC WITH DIFFERENTIAL      Result Value Ref Range   WBC 7.7  4.0 - 10.5 K/uL   RBC 3.67 (*) 3.87 - 5.11 MIL/uL   Hemoglobin 10.4 (*) 12.0 - 15.0 g/dL   HCT 16.1 (*) 09.6 - 04.5 %   MCV 91.0  78.0 - 100.0 fL   MCH 28.3  26.0 - 34.0 pg   MCHC 31.1  30.0 - 36.0 g/dL   RDW 40.9  81.1 - 91.4 %   Platelets 300  150 - 400 K/uL   Neutrophils Relative % 78 (*) 43 - 77 %   Neutro Abs 6.1  1.7 - 7.7 K/uL   Lymphocytes Relative 14  12 - 46 %   Lymphs Abs 1.1  0.7 - 4.0 K/uL   Monocytes Relative 7  3 - 12 %   Monocytes Absolute 0.5  0.1 - 1.0 K/uL   Eosinophils Relative 0  0 - 5 %   Eosinophils Absolute 0.0  0.0 - 0.7 K/uL    Basophils Relative 1  0 - 1 %   Basophils Absolute 0.0  0.0 - 0.1 K/uL  COMPREHENSIVE METABOLIC PANEL      Result Value Ref Range   Sodium 143  137 - 147 mEq/L   Potassium 5.0  3.7 - 5.3 mEq/L   Chloride 104  96 - 112 mEq/L   CO2 27  19 - 32 mEq/L   Glucose, Bld 112 (*) 70 - 99 mg/dL   BUN 40 (*) 6 - 23 mg/dL   Creatinine, Ser 7.82 (*) 0.50 - 1.10 mg/dL   Calcium 9.8  8.4 - 95.6 mg/dL   Total Protein 7.2  6.0 - 8.3 g/dL   Albumin 3.7  3.5 - 5.2 g/dL   AST 28  0 - 37 U/L   ALT 20  0 - 35 U/L   Alkaline Phosphatase 178 (*) 39 - 117 U/L   Total Bilirubin 0.4  0.3 - 1.2 mg/dL   GFR calc non Af Amer 39 (*) >90 mL/min   GFR calc Af Amer 45 (*) >90 mL/min   Anion gap 12  5 - 15  PROTIME-INR      Result Value Ref Range   Prothrombin Time 13.0  11.6 - 15.2 seconds   INR 0.98  0.00 - 1.49  LIPASE, BLOOD      Result Value Ref Range   Lipase 47  11 - 59 U/L  LACTIC ACID, PLASMA      Result Value Ref Range   Lactic Acid, Venous 1.1  0.5 - 2.2 mmol/L  POC OCCULT BLOOD, ED      Result Value Ref Range   Fecal Occult Bld POSITIVE (*) NEGATIVE  TYPE AND SCREEN      Result Value Ref Range   ABO/RH(D) A POS     Antibody Screen NEG     Sample Expiration 08/20/2013     Dg Chest 2 View 08/17/2013   CLINICAL DATA:  Bloody stools and history of pulmonary fibrosis.  EXAM: CHEST  2 VIEW  COMPARISON:  04/29/2013  FINDINGS: Patient rotated right. Convex right thoracic spine curvature. Midline trachea. Mild cardiomegaly. No pleural effusion or pneumothorax. Mild biapical pleural thickening. Pulmonary fibrosis which is not significantly changed. No lobar consolidation. Cannot exclude left upper lobe subtle opacity.  IMPRESSION: Pulmonary fibrosis, without definite superimposed process. Possible left upper lobe opacity for which early infection or aspiration cannot be excluded. Consider short term radiographic followup.  Cardiomegaly without congestive failure.   Electronically Signed   By: Jeronimo Greaves M.D.    On: 08/17/2013 12:39   Ct Abdomen Pelvis W Contrast 08/17/2013   CLINICAL DATA:  Right lower quadrant cramping pain and discomfort. Rectal bleeding.  EXAM: CT ABDOMEN AND PELVIS WITH CONTRAST  TECHNIQUE: Multidetector CT imaging of the abdomen and pelvis was performed using the standard protocol following bolus administration of intravenous contrast.  CONTRAST:  80mL OMNIPAQUE IOHEXOL 300 MG/ML  SOLN  COMPARISON:  CT scan dated 05/04/2010  FINDINGS: The liver is nodular consistent with cirrhosis. Spleen, pancreas, adrenal glands, and kidneys are normal. There is a moderate hiatal hernia, slightly larger than on the prior study.  The patient has extensive diverticulosis of the colon, most severe in the sigmoid region but there is no evidence of acute diverticulitis. Uterus and ovaries and bladder are normal. There are diffuse degenerative changes in the lumbar spine. No free air or free fluid. Extensive calcification in the abdominal aorta. There are small bilateral inguinal hernias containing nondilated small bowel.  Severe chronic obstructive and interstitial lung disease, markedly progressed. There are new areas of nodular consolidation in the right middle and lower lobes.  IMPRESSION: 1. Cirrhosis. 2. Extensive diverticulosis without diverticulitis. 3. Severe chronic interstitial and obstructive lung disease.   Electronically Signed   By: Geanie Cooley M.D.   On: 08/17/2013 15:27    Results for KALEEYA, HANCOCK (MRN [id number]) as of 08/17/2013 15:34  Ref. Range 10/08/2012 14:05 11/19/2012 14:27 01/05/2013 14:30 04/29/2013 14:41 08/17/2013 10:44  Hemoglobin Latest Range: 12.0-15.0 g/dL 60.6 (L) 30.1 (L) 60.1 (L) 12.2 10.4 (L)  HCT Latest Range: 36.0-46.0 % 32.4 (L) 34.2 (L) 36.5 37.0 33.4 (L)     1550:  H/H lower than baseline with maroon stool in rectal vault; will admit. VS remain stable. Dx and testing d/w pt and family.  Questions answered.  Verb understanding, agreeable to admit. T/C to [facility].  Rito Ehrlich, case discussed, including:  HPI, pertinent PM/SHx, VS/PE, dx testing, ED course and treatment:  Agreeable to admit, requests to write temporary orders including clear liquids diet, obtain tele bed to team 10.   Samuel Jester, DO 08/20/13 1527

## 2013-08-17 NOTE — H&P (Addendum)
History and Physical  [name] WUJ:[id number] DOB: [birth year] DOA: 08/17/2013  Referring physician: Dois Davenport, ER physician PCP: Darrow Bussing, MD   Chief Complaint: rectal bleeding  HPI: WEDNESDAY [name] is a 80 y.o. female  Past medical history of pulmonary fibrosis and diverticulosis who in the past 24 hours has started noticing significant episodes of bright red blood per rectum. In the days prior to this, she had noted that she started to have some increased dyspnea on exertion, but nothing else. She thought the blood was pouring out of her.  She came to the emergency room,and labs were done noting a hemoglobin of 10.4-baseline 12, and creatinine of 1.23 with a BUN of 40, baseline creatinine less than 1. A CT scan done noted diverticulosis but no evidence of diverticulitis. Patient also noted by chest x-ray to have a haziness in the left upper lobe concerning for infiltrate. Patient herself has noted no fever or increased cough. Hospitalists were called for further evaluation   Review of Systems:  Patient seen in emergency room. Pt complains of mild increased shortness of breath from her baseline of chronic shortness of breath. She continues to have chronic nonproductive cough. Her rectal bleeding has since stopped since she's been in the emergency room.  Pt denies any headaches, vision changes, dysphagia, chest pain, palpitations, wheezing, abdominal pain, hematuria, dysuria, constipation or diarrhea, focal extremity numbness or weakness or pain.  Review of systems are otherwise negative  Past Medical History  Diagnosis Date  . COPD (chronic obstructive pulmonary disease)   . Hyperlipemia   . Heart murmur   . Shortness of breath     with exertion  . Asthma   . Pneumonia     hx of frequent episodes of pneumonia  . Headache(784.0)     MIgraine- Visualchanges- flashing lights and cant focus and cant see.  Marland Kitchen GERD (gastroesophageal reflux disease)   . Arthritis   .  Diverticulitis 2008  . Pulmonary fibrosis   . Hypertension   . Cirrhosis of liver not due to alcohol   . Hypothyroidism   . On home O2     2L N/C   Past Surgical History  Procedure Laterality Date  . Eye surgery  05/2008    - bilateral  . Back surgery    . Total knee arthroplasty  11/2008  . Rotator cuff repair  2008    right  . Retinal tear repair cryotherapy    . Tonsillectomy    . Appendectomy    . Dilation and curettage of uterus    . Total knee arthroplasty  07/03/2011    Procedure: TOTAL KNEE ARTHROPLASTY;  Surgeon: Valeria Batman, MD;  Location: [facility] OR;  Service: Orthopedics;  Laterality: Right;  . Transthoracic echocardiogram  04/12/2011    EF>55%; mod concentric LVH; mild-mod TR  . Varicose vein surgery     Social History:  reports that she quit smoking about 66 years ago. She has never used smokeless tobacco. She reports that she does not drink alcohol or use illicit drugs. Patient lives at home by herself & is able to participate in activities of daily living withuse of a cane  No Known Allergies  Family History  Problem Relation Age of Onset  . Multiple sclerosis Sister   . Diabetes Sister   . Heart disease Sister   . Heart attack Brother   . Stroke Maternal Grandmother   . Hypertension Sister   . Hyperlipidemia Sister   . Stroke Sister  Reviewed with patient  Prior to Admission medications   Medication Sig Start Date End Date Taking? Authorizing Provider  albuterol (PROVENTIL HFA;VENTOLIN HFA) 108 (90 BASE) MCG/ACT inhaler Inhale 2 puffs into the lungs every 6 (six) hours as needed. For shortness of breath   Yes Historical Provider, MD  albuterol (PROVENTIL) (5 MG/ML) 0.5% nebulizer solution Take 0.5 mL (2.5 mg total) by nebulization every 6 (six) hours as needed for wheezing. 08/25/12  Yes Tora KindredMarianne L York, PA-C  aspirin 81 MG tablet Take 81 mg by mouth daily.   Yes Historical Provider, MD  Azelastine-Fluticasone (DYMISTA) 137-50 MCG/ACT SUSP Place 1 puff  into the nose 2 (two) times daily. 06/17/13  Yes Nyoka CowdenMichael B Wert, MD  calcium carbonate (OS-CAL) 600 MG TABS tablet Take 600 mg by mouth daily with lunch.    Yes Historical Provider, MD  Esomeprazole Magnesium (NEXIUM PO) Take 22.3 mg by mouth every morning. OTC dose   Yes Historical Provider, MD  Glucosamine-Chondroitin (GLUCOSAMINE CHONDR COMPLEX PO) Take 1 tablet by mouth daily.    Yes Historical Provider, MD  irbesartan (AVAPRO) 300 MG tablet Take 1 tablet (300 mg total) by mouth daily. 03/10/13  Yes Chrystie NoseKenneth C. Hilty, MD  levothyroxine (SYNTHROID, LEVOTHROID) 88 MCG tablet Take 88 mcg by mouth daily before breakfast.   Yes Historical Provider, MD  magnesium oxide (MAG-OX) 400 MG tablet Take 400 mg by mouth daily.    Yes Historical Provider, MD  meloxicam (MOBIC) 15 MG tablet Take 15 mg by mouth daily.   Yes Historical Provider, MD  methocarbamol (ROBAXIN) 500 MG tablet Take 500 mg by mouth 2 (two) times daily.    Yes Historical Provider, MD  naproxen sodium (ANAPROX) 220 MG tablet Take 220 mg by mouth 2 (two) times daily with a meal.   Yes Historical Provider, MD  nebivolol (BYSTOLIC) 5 MG tablet Take 1 tablet (5 mg total) by mouth daily. 04/29/13  Yes Nyoka CowdenMichael B Wert, MD  Polyethyl Glycol-Propyl Glycol (SYSTANE OP) Place 1 drop into both eyes 4 (four) times daily as needed (dry eyes). For dry eyes   Yes Historical Provider, MD  triamterene-hydrochlorothiazide (MAXZIDE-25) 37.5-25 MG per tablet Take 1 tablet by mouth See admin instructions. No specific days. She doesn't always take it. She states she at least takes it three times weekly.   Yes Historical Provider, MD    Physical Exam: BP 125/58  Pulse 67  Temp(Src) 97.7 F (36.5 C) (Oral)  Resp 18  SpO2 100%  General:  Alert and oriented x3, no acute distress Eyes: sclerae nonicteric, extraocular movements are intact ENT: normocephalic, atraumatic, mucous membranes are slightly dry Neck: no JVD Cardiovascular:  Regular rate and rhythm,  S1-S2, soft 2/6 systolic ejection murmur Respiratory: some very fine crackles, but good airway exchange Abdomen: soft, nontender, nondistended, positive bowel sounds Skin:  noskin breaks, tears or lesions Musculoskeletal: no clubbing or cyanosis or edema Psychiatric: patient is appropriate, no evidence of psychoses Neurologic: no focal deficits          Labs on Admission:  Basic Metabolic Panel:  Recent Labs Lab 08/17/13 1044  NA 143  K 5.0  CL 104  CO2 27  GLUCOSE 112*  BUN 40*  CREATININE 1.23*  CALCIUM 9.8   Liver Function Tests:  Recent Labs Lab 08/17/13 1044  AST 28  ALT 20  ALKPHOS 178*  BILITOT 0.4  PROT 7.2  ALBUMIN 3.7    Recent Labs Lab 08/17/13 1108  LIPASE 47   No results found  for this basename: AMMONIA,  in the last 168 hours CBC:  Recent Labs Lab 08/17/13 1044  WBC 7.7  NEUTROABS 6.1  HGB 10.4*  HCT 33.4*  MCV 91.0  PLT 300   Cardiac Enzymes: No results found for this basename: CKTOTAL, CKMB, CKMBINDEX, TROPONINI,  in the last 168 hours  BNP (last 3 results)  Recent Labs  04/29/13 1441  PROBNP 193.0*   CBG: No results found for this basename: GLUCAP,  in the last 168 hours  Radiological Exams on Admission: Dg Chest 2 View  08/17/2013   CLINICAL DATA:  Bloody stools and history of pulmonary fibrosis.  EXAM: CHEST  2 VIEW  COMPARISON:  04/29/2013  FINDINGS: Patient rotated right. Convex right thoracic spine curvature. Midline trachea. Mild cardiomegaly. No pleural effusion or pneumothorax. Mild biapical pleural thickening. Pulmonary fibrosis which is not significantly changed. No lobar consolidation. Cannot exclude left upper lobe subtle opacity.  IMPRESSION: Pulmonary fibrosis, without definite superimposed process. Possible left upper lobe opacity for which early infection or aspiration cannot be excluded. Consider short term radiographic followup.  Cardiomegaly without congestive failure.   Electronically Signed   By: Jeronimo Greaves  M.D.   On: 08/17/2013 12:39   Ct Abdomen Pelvis W Contrast  08/17/2013   CLINICAL DATA:  Right lower quadrant cramping pain and discomfort. Rectal bleeding.  EXAM: CT ABDOMEN AND PELVIS WITH CONTRAST  TECHNIQUE: Multidetector CT imaging of the abdomen and pelvis was performed using the standard protocol following bolus administration of intravenous contrast.  CONTRAST:  80mL OMNIPAQUE IOHEXOL 300 MG/ML  SOLN  COMPARISON:  CT scan dated 05/04/2010  FINDINGS: The liver is nodular consistent with cirrhosis. Spleen, pancreas, adrenal glands, and kidneys are normal. There is a moderate hiatal hernia, slightly larger than on the prior study.  The patient has extensive diverticulosis of the colon, most severe in the sigmoid region but there is no evidence of acute diverticulitis. Uterus and ovaries and bladder are normal. There are diffuse degenerative changes in the lumbar spine. No free air or free fluid. Extensive calcification in the abdominal aorta. There are small bilateral inguinal hernias containing nondilated small bowel.  Severe chronic obstructive and interstitial lung disease, markedly progressed. There are new areas of nodular consolidation in the right middle and lower lobes.  IMPRESSION: 1. Cirrhosis. 2. Extensive diverticulosis without diverticulitis. 3. Severe chronic interstitial and obstructive lung disease.   Electronically Signed   By: Geanie Cooley M.D.   On: 08/17/2013 15:27     Assessment/Plan Present on Admission:  . Chronic respiratory failure: secondary pulmonary fibrosis. Patient appears stable. Her increased complaints of shortness of breath as of late may be related to this bleeding. Continue to monitor.  Cardiomegaly noted on chest x-ray, likely secondary to chronic pulmonary fibrosis. Echocardiogram done in 2013 noted to dysfunction. We'll check a BNP and if markedly elevated, will check echo and diurese if needed.  Marland Kitchen HTN (hypertension): Stable. Holding oral medications  .  Hypothyroidism: IV Synthroid until able to take by mouth.  . GERD (gastroesophageal reflux disease) . Non-alcoholic cirrhosis: Stable. No thrombocytopenia and normal INR.  Marland Kitchen Diverticulosis of large intestine with hemorrhage: Likely diverticular bleeding. Spoke with [facility]. Given that her hemoglobin and vital signs are still relatively stable, would favor no intervention at this time. No need for colonoscopy. Have advised him to hold off on formal consultation. They will keep her on their list to follow her labs in the morning. They asked to please call if she has  any change in her condition or if her hemoglobin is a significant drop tomorrow.  . ARF (acute renal failure): Likely secondary to acute blood loss. Hydrate and expect she will drop slightly lower tomorrow given heme concentration.  . Pulmonary fibrosis: Looks to be stable. Suspect her shortness of breath is likely from acute blood loss  . Abnormal chest x-ray: Noted on chest x-ray. However, noother evidence of pneumonia, worsening hypoxia or fever.recommend no antibiotic treatment at this time  Protein calorie malnutrition: Patient reports a weight loss ever since her last pneumonia. She does not have much body fat.Will consult nutrition for formal evaluation and recommendations.  Consultants: case discussed with [facility] gastroenterology, they are available for further intervention if needed  Code Status: DO NOT RESUSCITATE as confirmed by patient  Family Communication:  Daughter at the bedside   Disposition Plan: home possibly as early as tomorrow, if hemoglobin stable  Time spent: 35 minutes  Hollice Espy [facility] Pager [phone]  **Disclaimer: This note may have been dictated with voice recognition software. Similar sounding words can inadvertently be transcribed and this note may contain transcription errors which may not have been corrected upon publication of note.**

## 2013-08-17 NOTE — ED Notes (Signed)
Hospitalist at bedside 

## 2013-08-17 NOTE — ED Notes (Signed)
Pt placed into gown and on monitor upon arrival to room. Pt monitored by 5 lead, blood pressure, and pulse ox.  

## 2013-08-17 NOTE — ED Notes (Signed)
Pt sts stools x 7 throughout night with BRB; pt sts hx of diverticulitis and feels same; pt sts some abd pain yesterday but denies at present

## 2013-08-17 NOTE — ED Notes (Signed)
Pt continues to be monitored by 5 lead, blood pressure, and pulse ox. Pt remains on 2L via [place] 

## 2013-08-17 NOTE — ED Notes (Addendum)
Pt continues to be monitored by 5 lead, blood pressure, and pulse ox. Pt remains on 2L via [place]

## 2013-08-18 DIAGNOSIS — Z66 Do not resuscitate: Secondary | ICD-10-CM | POA: Diagnosis present

## 2013-08-18 DIAGNOSIS — D62 Acute posthemorrhagic anemia: Secondary | ICD-10-CM

## 2013-08-18 DIAGNOSIS — I10 Essential (primary) hypertension: Secondary | ICD-10-CM | POA: Diagnosis present

## 2013-08-18 DIAGNOSIS — K219 Gastro-esophageal reflux disease without esophagitis: Secondary | ICD-10-CM | POA: Diagnosis present

## 2013-08-18 DIAGNOSIS — Z79899 Other long term (current) drug therapy: Secondary | ICD-10-CM | POA: Diagnosis not present

## 2013-08-18 DIAGNOSIS — J449 Chronic obstructive pulmonary disease, unspecified: Secondary | ICD-10-CM | POA: Diagnosis present

## 2013-08-18 DIAGNOSIS — E039 Hypothyroidism, unspecified: Secondary | ICD-10-CM | POA: Diagnosis present

## 2013-08-18 DIAGNOSIS — K922 Gastrointestinal hemorrhage, unspecified: Secondary | ICD-10-CM | POA: Diagnosis present

## 2013-08-18 DIAGNOSIS — E43 Unspecified severe protein-calorie malnutrition: Secondary | ICD-10-CM | POA: Diagnosis present

## 2013-08-18 DIAGNOSIS — Z87891 Personal history of nicotine dependence: Secondary | ICD-10-CM | POA: Diagnosis not present

## 2013-08-18 DIAGNOSIS — IMO0002 Reserved for concepts with insufficient information to code with codable children: Secondary | ICD-10-CM | POA: Diagnosis not present

## 2013-08-18 DIAGNOSIS — J9610 Chronic respiratory failure, unspecified whether with hypoxia or hypercapnia: Secondary | ICD-10-CM | POA: Diagnosis present

## 2013-08-18 DIAGNOSIS — Z7982 Long term (current) use of aspirin: Secondary | ICD-10-CM | POA: Diagnosis not present

## 2013-08-18 DIAGNOSIS — Z9981 Dependence on supplemental oxygen: Secondary | ICD-10-CM | POA: Diagnosis not present

## 2013-08-18 DIAGNOSIS — J8410 Pulmonary fibrosis, unspecified: Secondary | ICD-10-CM | POA: Diagnosis present

## 2013-08-18 DIAGNOSIS — E785 Hyperlipidemia, unspecified: Secondary | ICD-10-CM | POA: Diagnosis present

## 2013-08-18 DIAGNOSIS — R0902 Hypoxemia: Secondary | ICD-10-CM

## 2013-08-18 DIAGNOSIS — D500 Iron deficiency anemia secondary to blood loss (chronic): Secondary | ICD-10-CM | POA: Diagnosis present

## 2013-08-18 DIAGNOSIS — K5731 Diverticulosis of large intestine without perforation or abscess with bleeding: Secondary | ICD-10-CM | POA: Diagnosis present

## 2013-08-18 DIAGNOSIS — N179 Acute kidney failure, unspecified: Secondary | ICD-10-CM | POA: Diagnosis present

## 2013-08-18 DIAGNOSIS — K7460 Unspecified cirrhosis of liver: Secondary | ICD-10-CM | POA: Diagnosis present

## 2013-08-18 DIAGNOSIS — Z96659 Presence of unspecified artificial knee joint: Secondary | ICD-10-CM | POA: Diagnosis not present

## 2013-08-18 LAB — CBC
HCT: 25.2 % — ABNORMAL LOW (ref 36.0–46.0)
HCT: 25.5 % — ABNORMAL LOW (ref 36.0–46.0)
HEMATOCRIT: 26.9 % — AB (ref 36.0–46.0)
HEMATOCRIT: 23.1 % — AB (ref 36.0–46.0)
HEMOGLOBIN: 8.1 g/dL — AB (ref 12.0–15.0)
HEMOGLOBIN: 7.4 g/dL — AB (ref 12.0–15.0)
Hemoglobin: 8.1 g/dL — ABNORMAL LOW (ref 12.0–15.0)
Hemoglobin: 8.6 g/dL — ABNORMAL LOW (ref 12.0–15.0)
MCH: 29.5 pg (ref 26.0–34.0)
MCH: 29.2 pg (ref 26.0–34.0)
MCH: 28.9 pg (ref 26.0–34.0)
MCH: 28.7 pg (ref 26.0–34.0)
MCHC: 32.1 g/dL (ref 30.0–36.0)
MCHC: 31.8 g/dL (ref 30.0–36.0)
MCHC: 32 g/dL (ref 30.0–36.0)
MCHC: 32 g/dL (ref 30.0–36.0)
MCV: 91.6 fL (ref 78.0–100.0)
MCV: 92.1 fL (ref 78.0–100.0)
MCV: 90.3 fL (ref 78.0–100.0)
MCV: 89.5 fL (ref 78.0–100.0)
PLATELETS: 236 10*3/uL (ref 150–400)
PLATELETS: 223 10*3/uL (ref 150–400)
PLATELETS: 267 10*3/uL (ref 150–400)
Platelets: 211 10*3/uL (ref 150–400)
RBC: 2.75 MIL/uL — AB (ref 3.87–5.11)
RBC: 2.77 MIL/uL — ABNORMAL LOW (ref 3.87–5.11)
RBC: 2.98 MIL/uL — AB (ref 3.87–5.11)
RBC: 2.58 MIL/uL — AB (ref 3.87–5.11)
RDW: 14.5 % (ref 11.5–15.5)
RDW: 14.3 % (ref 11.5–15.5)
RDW: 14.3 % (ref 11.5–15.5)
RDW: 14.2 % (ref 11.5–15.5)
WBC: 5.8 10*3/uL (ref 4.0–10.5)
WBC: 5.6 10*3/uL (ref 4.0–10.5)
WBC: 7 10*3/uL (ref 4.0–10.5)
WBC: 6.3 10*3/uL (ref 4.0–10.5)

## 2013-08-18 LAB — BASIC METABOLIC PANEL
ANION GAP: 10 (ref 5–15)
BUN: 31 mg/dL — ABNORMAL HIGH (ref 6–23)
CHLORIDE: 107 meq/L (ref 96–112)
CO2: 25 meq/L (ref 19–32)
Calcium: 8.7 mg/dL (ref 8.4–10.5)
Creatinine, Ser: 1.04 mg/dL (ref 0.50–1.10)
GFR calc non Af Amer: 48 mL/min — ABNORMAL LOW (ref >90)
GFR, EST AFRICAN AMERICAN: 56 mL/min — AB (ref >90)
Glucose, Bld: 87 mg/dL (ref 70–99)
Potassium: 4.7 mEq/L (ref 3.7–5.3)
SODIUM: 142 meq/L (ref 137–147)

## 2013-08-18 MED ORDER — BOOST / RESOURCE BREEZE PO LIQD
1.0000 | Freq: Three times a day (TID) | ORAL | Status: DC
  Administered 2013-08-18 – 2013-08-20 (×4): 1 via ORAL

## 2013-08-18 MED ORDER — ACETAMINOPHEN 325 MG PO TABS
650.0000 mg | ORAL_TABLET | ORAL | Status: DC | PRN
Start: 2013-08-18 — End: 2013-08-18

## 2013-08-18 MED ORDER — AZELASTINE-FLUTICASONE 137-50 MCG/ACT NA SUSP: 1.0000 | Freq: Two times a day (BID) | NASAL | Status: DC

## 2013-08-18 MED ORDER — PANTOPRAZOLE SODIUM 40 MG IV SOLR
40.0000 mg | INTRAVENOUS | Status: DC
  Administered 2013-08-18: 40 mg via INTRAVENOUS
  Filled 2013-08-18 (×2): qty 40

## 2013-08-18 MED ORDER — AZELASTINE HCL 0.1 % NA SOLN
1.0000 | Freq: Two times a day (BID) | NASAL | Status: DC
  Administered 2013-08-18 – 2013-08-20 (×4): 1 via NASAL
  Filled 2013-08-18: qty 30

## 2013-08-18 MED ORDER — FLUTICASONE PROPIONATE 50 MCG/ACT NA SUSP
2.0000 | Freq: Two times a day (BID) | NASAL | Status: DC
  Administered 2013-08-18 – 2013-08-20 (×4): 2 via NASAL
  Filled 2013-08-18: qty 16

## 2013-08-18 MED ORDER — SODIUM CHLORIDE 0.9 % IV BOLUS (SEPSIS): 500.0000 mL | Freq: Once | INTRAVENOUS | Status: DC

## 2013-08-18 MED ORDER — ZOLPIDEM TARTRATE 5 MG PO TABS
5.0000 mg | ORAL_TABLET | Freq: Once | ORAL | Status: AC
  Administered 2013-08-18: 5 mg via ORAL
  Filled 2013-08-18: qty 1

## 2013-08-18 NOTE — Progress Notes (Addendum)
PROGRESS NOTE    [name] DUK:[id number] DOB: June 26, 1927 DOA: 08/17/2013 PCP: Darrow Bussing, MD  HPI/Brief narrative 80 year old female patient with history of pulmonary fibrosis, chronic respiratory failure on home oxygen 2 L per minute, diverticulosis, COPD, HLD, GERD, hypertension, hypothyroid, cirrhosis presented to the hospital on 08/17/13 secondary to episodes of bright red bleeding per rectum and worsening DOE. Initial hemoglobin 10.4-baseline 12. CT abdomen: Diverticulosis. [facility] consulted on 08/18/13.   Assessment/Plan:  1. LGIB/likely Acute diverticular bleeding: On admission it had seemed as though her rectal bleeding had subsided since she did not have any bleeding since 9 AM on 8/10. However since then she has had a bloody BM at 9:30 PM on 8/10 and on other witnessed bloody BM this morning. Denies abdominal pain. [facility] consulted-may plan bleeding scan/angiography. Continue clear liquids and monitor closely. Patient on aspirin and meloxicam at home-held. 2. Acute post hemorrhagic anemia: Secondary to rectal bleeding. Monitor CBCs closely and transfuse if hemoglobin less than 8 g per DL. 3. Chronic respiratory failure/pulmonary fibrosis/COPD: Worsening dyspnea on exertion is probably secondary to acute blood loss anemia. Clinically not volume overloaded. Although chest x-ray reported as possible UL opacity, clinically low index of suspicion for pneumonia. Continue oxygen and monitor. BNP is low. Continue home inhalers.  4. Hypertension: Controlled. By mouth meds currently on hold secondary to ongoing bleeding. Patient on nebivolol, Maxzide & Avapro. 5. Hypothyroid: Continue IV Synthroid. 6. GERD: Asymptomatic. On Nexium at home. Continue IV Protonix. 7. Non-alcoholic cirrhosis history: type not known. 8. Protein calorie malnutrition: Dietitian consulted for evaluation and management. 9. Mild acute renal failure: Secondary to problem #1 in the context of diuretics and ARB.  Resolved after IV fluids. 10. DNR: Confirmed by admitting M.D. with patient. 11. Severe malnutrition in the context of chronic illness : per dietician.    Code Status: DO NOT RESUSCITATE Family Communication: None at bedside. Disposition Plan: Home when medically stable.   Consultants:  Gastroenterology  Procedures:  None  Antibiotics:  None   Subjective: Patient had a bloody BM last night and this morning. Feels generally weak but denies dizziness, lightheadedness, chest pain. Weakness is worse with activity. No abdominal pain, nausea or vomiting.  Objective: Filed Vitals:   08/17/13 1818 08/17/13 2044 08/18/13 0511 08/18/13 1000  BP: 136/74 152/73 136/66 134/58  Pulse: 76 89 82 89  Temp: 98.4 F (36.9 C) 97.9 F (36.6 C) 98.2 F (36.8 C) 98.2 F (36.8 C)  TempSrc: Oral Oral Oral Oral  Resp: 22 20 20 20   Height: 5\' 3"  (1.6 m) 5\' 3"  (1.6 m)    Weight: 53.6 kg (118 lb 2.7 oz) 53.797 kg (118 lb 9.6 oz)    SpO2: 100% 97% 99% 98%    Intake/Output Summary (Last 24 hours) at 08/18/13 1040 Last data filed at 08/18/13 0900  Gross per 24 hour  Intake    360 ml  Output      0 ml  Net    360 ml   Filed Weights   08/17/13 1818 08/17/13 2044  Weight: 53.6 kg (118 lb 2.7 oz) 53.797 kg (118 lb 9.6 oz)     Exam:  General exam: Pleasant elderly female seen sitting up eating liquid breakfast this morning. Respiratory system: Coarse Velcro-like crackles in the bases. Rest of lung fields clear to auscultation. No increased work of breathing. Cardiovascular system: S1 & S2 heard, RRR. No JVD, murmurs, gallops, clicks or pedal edema. Gastrointestinal system: Abdomen is nondistended, soft and nontender. Normal  bowel sounds heard. Central nervous system: Alert and oriented. No focal neurological deficits. Extremities: Symmetric 5 x 5 power.   Data Reviewed: Basic Metabolic Panel:  Recent Labs Lab 08/17/13 1044 08/18/13 0337  NA 143 142  K 5.0 4.7  CL 104 107  CO2  27 25  GLUCOSE 112* 87  BUN 40* 31*  CREATININE 1.23* 1.04  CALCIUM 9.8 8.7   Liver Function Tests:  Recent Labs Lab 08/17/13 1044  AST 28  ALT 20  ALKPHOS 178*  BILITOT 0.4  PROT 7.2  ALBUMIN 3.7    Recent Labs Lab 08/17/13 1108  LIPASE 47   No results found for this basename: AMMONIA,  in the last 168 hours CBC:  Recent Labs Lab 08/17/13 1044 08/18/13 0337 08/18/13 0935  WBC 7.7 5.8 5.6  NEUTROABS 6.1  --   --   HGB 10.4* 8.1* 8.1*  HCT 33.4* 25.2* 25.5*  MCV 91.0 91.6 92.1  PLT 300 236 223   Cardiac Enzymes: No results found for this basename: CKTOTAL, CKMB, CKMBINDEX, TROPONINI,  in the last 168 hours BNP (last 3 results)  Recent Labs  04/29/13 1441 08/17/13 1959  PROBNP 193.0* 332.5   CBG: No results found for this basename: GLUCAP,  in the last 168 hours  No results found for this or any previous visit (from the past 240 hour(s)).     Studies: Dg Chest 2 View  08/17/2013   CLINICAL DATA:  Bloody stools and history of pulmonary fibrosis.  EXAM: CHEST  2 VIEW  COMPARISON:  04/29/2013  FINDINGS: Patient rotated right. Convex right thoracic spine curvature. Midline trachea. Mild cardiomegaly. No pleural effusion or pneumothorax. Mild biapical pleural thickening. Pulmonary fibrosis which is not significantly changed. No lobar consolidation. Cannot exclude left upper lobe subtle opacity.  IMPRESSION: Pulmonary fibrosis, without definite superimposed process. Possible left upper lobe opacity for which early infection or aspiration cannot be excluded. Consider short term radiographic followup.  Cardiomegaly without congestive failure.   Electronically Signed   By: Jeronimo Greaves M.D.   On: 08/17/2013 12:39   Ct Abdomen Pelvis W Contrast  08/17/2013   CLINICAL DATA:  Right lower quadrant cramping pain and discomfort. Rectal bleeding.  EXAM: CT ABDOMEN AND PELVIS WITH CONTRAST  TECHNIQUE: Multidetector CT imaging of the abdomen and pelvis was performed using the  standard protocol following bolus administration of intravenous contrast.  CONTRAST:  80mL OMNIPAQUE IOHEXOL 300 MG/ML  SOLN  COMPARISON:  CT scan dated 05/04/2010  FINDINGS: The liver is nodular consistent with cirrhosis. Spleen, pancreas, adrenal glands, and kidneys are normal. There is a moderate hiatal hernia, slightly larger than on the prior study.  The patient has extensive diverticulosis of the colon, most severe in the sigmoid region but there is no evidence of acute diverticulitis. Uterus and ovaries and bladder are normal. There are diffuse degenerative changes in the lumbar spine. No free air or free fluid. Extensive calcification in the abdominal aorta. There are small bilateral inguinal hernias containing nondilated small bowel.  Severe chronic obstructive and interstitial lung disease, markedly progressed. There are new areas of nodular consolidation in the right middle and lower lobes.  IMPRESSION: 1. Cirrhosis. 2. Extensive diverticulosis without diverticulitis. 3. Severe chronic interstitial and obstructive lung disease.   Electronically Signed   By: Geanie Cooley M.D.   On: 08/17/2013 15:27        Scheduled Meds: . antiseptic oral rinse  7 mL Mouth Rinse BID  . levothyroxine  44 mcg Intravenous  Daily   Continuous Infusions: . sodium chloride 50 mL/hr at 08/18/13 [id number]    Principal Problem:   Diverticulosis of large intestine with hemorrhage Active Problems:   HTN (hypertension)   Hypothyroidism   Pulmonary fibrosis   Non-alcoholic cirrhosis   Chronic respiratory failure   GERD (gastroesophageal reflux disease)   ARF (acute renal failure)   GI bleeding   Abnormal chest x-ray   Protein calorie malnutrition   Diverticulosis of colon with hemorrhage    Time spent: 45 minutes    HONGALGI,ANAND, MD, FACP, FHM. [facility] Pager [phone]  If 7PM-7AM, please contact night-coverage [email or web address] Password [email or web address] 08/18/2013, 10:40 AM    LOS: 1 day

## 2013-08-18 NOTE — Consult Note (Signed)
Referring Provider: Dr. Waymon Amato ([facility]) Primary Care Physician:  Darrow Bussing, MD Primary Gastroenterologist:  none Reason for Consultation:   hematochezia   HPI: [name] is a 80 y.o. female  with a two-day history of recurrent hematochezia, a total of about 10 episodes, tapering off today. She was admitted through the emergency room last night because of this.   She has not had abdominal pain or syncope.   She indicates she had a similar episode in 2007, at which time she was evaluated by Dr. Sabino Gasser. He attempted colonoscopy but was unable to get beyond the sigmoid region because of tortuosity and multiple diverticula; a barium enema at that time showed pancolonic diverticulosis, most concentrated on the left side of the colon. She also underwent an endoscopy at that time, apparently negative.   She has not had any bleeding episodes since 2007.   Since admission, the patient has had a drop in hemoglobin to a level of 8.1, compared to her usual baseline of 12.  The patient is on a daily 81 mg aspirin. No significant prodromal dyspeptic symptomatology. BUN was moderately elevated on admission. An admission CT scan shows significant diverticulosis   Past Medical History  Diagnosis Date  . COPD (chronic obstructive pulmonary disease)   . Hyperlipemia   . Heart murmur   . Shortness of breath     with exertion  . Asthma   . Pneumonia     hx of frequent episodes of pneumonia  . Headache(784.0)     MIgraine- Visualchanges- flashing lights and cant focus and cant see.  Marland Kitchen GERD (gastroesophageal reflux disease)   . Arthritis   . Diverticulitis 2008  . Pulmonary fibrosis   . Hypertension   . Cirrhosis of liver not due to alcohol   . Hypothyroidism   . On home O2     2L N/C    Past Surgical History  Procedure Laterality Date  . Eye surgery  05/2008    - bilateral  . Back surgery    . Total knee arthroplasty  11/2008  . Rotator cuff repair  2008    right   . Retinal tear repair cryotherapy    . Tonsillectomy    . Appendectomy    . Dilation and curettage of uterus    . Total knee arthroplasty  07/03/2011    Procedure: TOTAL KNEE ARTHROPLASTY;  Surgeon: Valeria Batman, MD;  Location: [facility] OR;  Service: Orthopedics;  Laterality: Right;  . Transthoracic echocardiogram  04/12/2011    EF>55%; mod concentric LVH; mild-mod TR  . Varicose vein surgery      Prior to Admission medications   Medication Sig Start Date End Date Taking? Authorizing Provider  albuterol (PROVENTIL HFA;VENTOLIN HFA) 108 (90 BASE) MCG/ACT inhaler Inhale 2 puffs into the lungs every 6 (six) hours as needed. For shortness of breath   Yes Historical Provider, MD  albuterol (PROVENTIL) (5 MG/ML) 0.5% nebulizer solution Take 0.5 mL (2.5 mg total) by nebulization every 6 (six) hours as needed for wheezing. 08/25/12  Yes Tora Kindred York, PA-C  aspirin 81 MG tablet Take 81 mg by mouth daily.   Yes Historical Provider, MD  Azelastine-Fluticasone (DYMISTA) 137-50 MCG/ACT SUSP Place 1 puff into the nose 2 (two) times daily. 06/17/13  Yes Nyoka Cowden, MD  calcium carbonate (OS-CAL) 600 MG TABS tablet Take 600 mg by mouth daily with lunch.    Yes Historical Provider, MD  Esomeprazole Magnesium (NEXIUM PO) Take 22.3 mg by mouth  every morning. OTC dose   Yes Historical Provider, MD  Glucosamine-Chondroitin (GLUCOSAMINE CHONDR COMPLEX PO) Take 1 tablet by mouth daily.    Yes Historical Provider, MD  irbesartan (AVAPRO) 300 MG tablet Take 1 tablet (300 mg total) by mouth daily. 03/10/13  Yes Chrystie Nose, MD  levothyroxine (SYNTHROID, LEVOTHROID) 88 MCG tablet Take 88 mcg by mouth daily before breakfast.   Yes Historical Provider, MD  magnesium oxide (MAG-OX) 400 MG tablet Take 400 mg by mouth daily.    Yes Historical Provider, MD  meloxicam (MOBIC) 15 MG tablet Take 15 mg by mouth daily.   Yes Historical Provider, MD  methocarbamol (ROBAXIN) 500 MG tablet Take 500 mg by mouth 2 (two) times  daily.    Yes Historical Provider, MD  naproxen sodium (ANAPROX) 220 MG tablet Take 220 mg by mouth 2 (two) times daily with a meal.   Yes Historical Provider, MD  nebivolol (BYSTOLIC) 5 MG tablet Take 1 tablet (5 mg total) by mouth daily. 04/29/13  Yes Nyoka Cowden, MD  Polyethyl Glycol-Propyl Glycol (SYSTANE OP) Place 1 drop into both eyes 4 (four) times daily as needed (dry eyes). For dry eyes   Yes Historical Provider, MD  triamterene-hydrochlorothiazide (MAXZIDE-25) 37.5-25 MG per tablet Take 1 tablet by mouth See admin instructions. No specific days. She doesn't always take it. She states she at least takes it three times weekly.   Yes Historical Provider, MD    Current Facility-Administered Medications  Medication Dose Route Frequency Provider Last Rate Last Dose  . 0.9 %  sodium chloride infusion   Intravenous Continuous Hollice Espy, MD 50 mL/hr at 08/18/13 0556    . albuterol (PROVENTIL) (2.5 MG/3ML) 0.083% nebulizer solution 2.5 mg  2.5 mg Nebulization Q6H PRN Hollice Espy, MD      . antiseptic oral rinse (CPC / CETYLPYRIDINIUM CHLORIDE 0.05%) solution 7 mL  7 mL Mouth Rinse BID Hollice Espy, MD   7 mL at 08/18/13 1000  . azelastine (ASTELIN) 0.1 % nasal spray 1 spray  1 spray Each Nare BID Elease Etienne, MD   1 spray at 08/18/13 1224   And  . fluticasone (FLONASE) 50 MCG/ACT nasal spray 2 spray  2 spray Each Nare BID Elease Etienne, MD   2 spray at 08/18/13 1224  . feeding supplement (RESOURCE BREEZE) (RESOURCE BREEZE) liquid 1 Container  1 Container Oral TID BM Heather Cornelison Pitts, RD   1 Container at 08/18/13 1400  . levothyroxine (SYNTHROID, LEVOTHROID) injection 44 mcg  44 mcg Intravenous Daily Hollice Espy, MD   44 mcg at 08/18/13 1045  . ondansetron (ZOFRAN) tablet 4 mg  4 mg Oral Q6H PRN Hollice Espy, MD       Or  . ondansetron (ZOFRAN) injection 4 mg  4 mg Intravenous Q6H PRN Hollice Espy, MD      . pantoprazole (PROTONIX) injection 40  mg  40 mg Intravenous Q24H Elease Etienne, MD   40 mg at 08/18/13 1224    Allergies as of 08/17/2013  . (No Known Allergies)    Family History  Problem Relation Age of Onset  . Multiple sclerosis Sister   . Diabetes Sister   . Heart disease Sister   . Heart attack Brother   . Stroke Maternal Grandmother   . Hypertension Sister   . Hyperlipidemia Sister   . Stroke Sister     History   Social History  . Marital Status: Widowed  Spouse Name: N/A    Number of Children: 5  . Years of Education: N/A   Occupational History  . Not on file.   Social History Main Topics  . Smoking status: Former Smoker -- 1.5 years    Quit date: 01/09/1947  . Smokeless tobacco: Never Used     Comment: socially smoed x 1 1/2 years 04/29/13  . Alcohol Use: No  . Drug Use: No  . Sexual Activity: No   Other Topics Concern  . Not on file   Social History Narrative  . No narrative on file    Review of Systems: No prodromal lower tract symptoms. She does have a history of occasional indigestion or heartburn and is maintained on Nexium.  Physical Exam: A thin, cognitively intact older female sitting in a chair in no distress. She is anicteric and without overt pallor although her skin does looks slightly pale. Chest has a few crackles at the right base. Heart unremarkable. Abdomen without overt mass effect or tenderness.   Vital signs in last 24 hours: Temp:  [97.9 F (36.6 C)-98.4 F (36.9 C)] 98.2 F (36.8 C) (08/11 1000) Pulse Rate:  [76-89] 89 (08/11 1000) Resp:  [20-22] 20 (08/11 1000) BP: (134-152)/(58-74) 134/58 mmHg (08/11 1000) SpO2:  [97 %-100 %] 98 % (08/11 1000) Weight:  [53.6 kg (118 lb 2.7 oz)-53.797 kg (118 lb 9.6 oz)] 53.797 kg (118 lb 9.6 oz) (08/10 2044) Last BM Date: 08/18/13   Intake/Output from previous day: 08/10 0701 - 08/11 0700 In: 120 [P.O.:120] Out: -  Intake/Output this shift: Total I/O In: 480 [P.O.:480] Out: -   Lab Results:  Recent Labs   08/18/13 0337 08/18/13 0935 08/18/13 1640  WBC 5.8 5.6 7.0  HGB 8.1* 8.1* 8.6*  HCT 25.2* 25.5* 26.9*  PLT 236 223 267   BMET  Recent Labs  08/17/13 1044 08/18/13 0337  NA 143 142  K 5.0 4.7  CL 104 107  CO2 27 25  GLUCOSE 112* 87  BUN 40* 31*  CREATININE 1.23* 1.04  CALCIUM 9.8 8.7   LFT  Recent Labs  08/17/13 1044  PROT 7.2  ALBUMIN 3.7  AST 28  ALT 20  ALKPHOS 178*  BILITOT 0.4   PT/INR  Recent Labs  08/17/13 1044  LABPROT 13.0  INR 0.98    Studies/Results: Dg Chest 2 View  08/17/2013   CLINICAL DATA:  Bloody stools and history of pulmonary fibrosis.  EXAM: CHEST  2 VIEW  COMPARISON:  04/29/2013  FINDINGS: Patient rotated right. Convex right thoracic spine curvature. Midline trachea. Mild cardiomegaly. No pleural effusion or pneumothorax. Mild biapical pleural thickening. Pulmonary fibrosis which is not significantly changed. No lobar consolidation. Cannot exclude left upper lobe subtle opacity.  IMPRESSION: Pulmonary fibrosis, without definite superimposed process. Possible left upper lobe opacity for which early infection or aspiration cannot be excluded. Consider short term radiographic followup.  Cardiomegaly without congestive failure.   Electronically Signed   By: Jeronimo Greaves M.D.   On: 08/17/2013 12:39   Ct Abdomen Pelvis W Contrast  08/17/2013   CLINICAL DATA:  Right lower quadrant cramping pain and discomfort. Rectal bleeding.  EXAM: CT ABDOMEN AND PELVIS WITH CONTRAST  TECHNIQUE: Multidetector CT imaging of the abdomen and pelvis was performed using the standard protocol following bolus administration of intravenous contrast.  CONTRAST:  80mL OMNIPAQUE IOHEXOL 300 MG/ML  SOLN  COMPARISON:  CT scan dated 05/04/2010  FINDINGS: The liver is nodular consistent with cirrhosis. Spleen, pancreas, adrenal glands,  and kidneys are normal. There is a moderate hiatal hernia, slightly larger than on the prior study.  The patient has extensive diverticulosis of the  colon, most severe in the sigmoid region but there is no evidence of acute diverticulitis. Uterus and ovaries and bladder are normal. There are diffuse degenerative changes in the lumbar spine. No free air or free fluid. Extensive calcification in the abdominal aorta. There are small bilateral inguinal hernias containing nondilated small bowel.  Severe chronic obstructive and interstitial lung disease, markedly progressed. There are new areas of nodular consolidation in the right middle and lower lobes.  IMPRESSION: 1. Cirrhosis. 2. Extensive diverticulosis without diverticulitis. 3. Severe chronic interstitial and obstructive lung disease.   Electronically Signed   By: Geanie CooleyJim  Maxwell M.D.   On: 08/17/2013 15:27    Impression:  1. Recurrent hematochezia 2. Moderate posthemorrhagic anemia 3. Extensive diverticulosis  Discussion: I think the clinical picture, despite the elevated BUN on admission and a history of aspirin exposure, is most consistent with a lower tract source, almost certainly diverticular.   Plan: Supportive care.  Bleeding scan if bleeding persists or accelerated.  No significant role for urgent colonoscopic evaluation.  I don't feel there is sufficient clinical indication to justify putting this patient through an upper endoscopy to confirm the absence of an upper tract source; at her age, bleeding sufficient to give right red blood per rectum repeatedly would almost certainly lead to frank hemodynamic instability, which she has not had. The patient might benefit from consideration of an elective outpatient colonoscopy once she is better, to confirm the absence of pathology in the lower tract (such as vascular ectasia) other than her known diverticular disease. The patient understands that, while in the hospital, are role is largely one of monitoring and observation, not intervention, unless she does stabilizes sufficiently that intervention is required.     LOS: 1 day    Raylen Tangonan V  08/18/2013, 5:50 PM

## 2013-08-18 NOTE — Progress Notes (Signed)
UR Completed.  [name] Jane [phone] 08/18/2013  

## 2013-08-18 NOTE — Clinical Documentation Improvement (Signed)
"  Non-alcoholic Cirrhosis" was found documented in chart. If possible, please help with preparation for ICD-10 coding by specifying the type of non-alcoholic cirrhosis (if know):  Congenital Atrophic Biliary (cholangiolitic, cholangitic, hypertrophic)  Obstructive Portal Other (please specify)   Thank you for your time with this, Servando SnareSalena Jelani Vreeland, RN Clinical Documentation Improvement Specialist (CDIS) [phone]

## 2013-08-18 NOTE — Progress Notes (Signed)
Patient has vaseline at bedside, and is currently on oxygen.  Patient verbalizes understanding of the risks of using both concurrently.  States she will only use a small amount on her lips.  RN attempted to discourage use of vaseline while on oxygen, but patient was insistent. Will continue to monitor.

## 2013-08-18 NOTE — Progress Notes (Signed)
Patient had a loose red and brown color BM today in the morning.

## 2013-08-18 NOTE — Progress Notes (Signed)
INITIAL NUTRITION ASSESSMENT  Pt meets criteria for SEVERE MALNUTRITION in the context of chronic illness as evidenced by severe fat and muscle mass loss.  DOCUMENTATION CODES Per approved criteria  -Severe malnutrition in the context of chronic illness   INTERVENTION: Provide Resource Breeze po TID, each supplement provides 250 kcal and 9 grams of protein.  NUTRITION DIAGNOSIS: Increased nutrient needs related to chronic illness,COPD as evidenced by estimated nutrition needs.   Goal: Pt to meet >/= 90% of their estimated nutrition needs.  Monitor:  PO intake, supplement acceptance. diet order, weight trends, labs, I/O's  Reason for Assessment: MD consult for assessment of nutrition requirements/status  80 y.o. female  Admitting Dx: Diverticulosis of large intestine with hemorrhage  ASSESSMENT: Pt with PMH of pulmonary fibrosis, GERD, hyperlipidemia, asthma, and diverticulosis who in the past 24 hours has started noticing significant episodes of bright red blood per rectum. Pt with increased dyspnea on exertion and acute renal failure likely due to acute blood loss.  Pt reports having an ok appetite. Pt reports eating 100% of her meal this morning and reports shortly after she started having stomach pains and had an uncomfortable bowel movement. Pt reports she has been eating good at home. Pt reports her last full meal was Sunday night, which contained a chicken sandwich and vegetables. She reports Sunday night is when she started having bloody bowel movements. Pt has not eaten much since then. Pt does report she has lost weight over a year and a half ago and has been trying to gain back the weight. She reports she used to weight 145 lbs, however over the past year and a half her weight has been stable around 115 lbs. Pt was educated on eating high calorie and high protein to help gain weight. Pt was also educated on a diet associated with diverticulitis. Pt expressed understanding. Pt  is willing to try Resource Breeze to help increase her calorie and protein intake. Will order.   Nutrition Focused Physical Exam:  Subcutaneous Fat:  Orbital Region: N/A Upper Arm Region: Moderate depletion Thoracic and Lumbar Region: Severe depletion  Muscle:  Temple Region: Moderate depletion Clavicle Bone Region: Severe depletion Clavicle and Acromion Bone Region: Severe depletion Scapular Bone Region: N/A Dorsal Hand: Severe depletion Patellar Region: WNL Anterior Thigh Region: Moderate depletion Posterior Calf Region: WNL  Edema: none  Labs: High BUN. Low GFR.  Height: Ht Readings from Last 1 Encounters:  08/17/13 5\' 3"  (1.6 m)    Weight: Wt Readings from Last 1 Encounters:  08/17/13 118 lb 9.6 oz (53.797 kg)    Ideal Body Weight: 115 lbs  % Ideal Body Weight: 103%  Wt Readings from Last 10 Encounters:  08/17/13 118 lb 9.6 oz (53.797 kg)  06/02/13 114 lb (51.71 kg)  04/29/13 119 lb 9.6 oz (54.25 kg)  04/14/13 117 lb 11.2 oz (53.388 kg)  03/10/13 119 lb 9.6 oz (54.25 kg)  01/05/13 122 lb (55.339 kg)  11/19/12 126 lb (57.153 kg)  10/08/12 128 lb (58.06 kg)  09/05/12 125 lb 9.6 oz (56.972 kg)  09/02/12 124 lb 12.8 oz (56.609 kg)    Usual Body Weight: 115 lbs  % Usual Body Weight: 103%  BMI:  Body mass index is 21.01 kg/(m^2).  Estimated Nutritional Needs: Kcal: 1600-1800 Protein: 70-85 Fluid: 1.6 L - 1.8 L/day  Skin: Left ankle abrasion  Diet Order: Clear Liquid  EDUCATION NEEDS: -Education needs addressed   Intake/Output Summary (Last 24 hours) at 08/18/13 0857 Last data filed  at 08/17/13 1700  Gross per 24 hour  Intake    120 ml  Output      0 ml  Net    120 ml    Last BM: 8/11  Labs:   Recent Labs Lab 08/17/13 1044 08/18/13 0337  NA 143 142  K 5.0 4.7  CL 104 107  CO2 27 25  BUN 40* 31*  CREATININE 1.23* 1.04  CALCIUM 9.8 8.7  GLUCOSE 112* 87    CBG (last 3)  No results found for this basename: GLUCAP,  in the last  72 hours  Scheduled Meds: . antiseptic oral rinse  7 mL Mouth Rinse BID  . levothyroxine  44 mcg Intravenous Daily    Continuous Infusions: . sodium chloride 50 mL/hr at 08/18/13 0556    Past Medical History  Diagnosis Date  . COPD (chronic obstructive pulmonary disease)   . Hyperlipemia   . Heart murmur   . Shortness of breath     with exertion  . Asthma   . Pneumonia     hx of frequent episodes of pneumonia  . Headache(784.0)     MIgraine- Visualchanges- flashing lights and cant focus and cant see.  Marland Kitchen. GERD (gastroesophageal reflux disease)   . Arthritis   . Diverticulitis 2008  . Pulmonary fibrosis   . Hypertension   . Cirrhosis of liver not due to alcohol   . Hypothyroidism   . On home O2     2L N/C    Past Surgical History  Procedure Laterality Date  . Eye surgery  05/2008    - bilateral  . Back surgery    . Total knee arthroplasty  11/2008  . Rotator cuff repair  2008    right  . Retinal tear repair cryotherapy    . Tonsillectomy    . Appendectomy    . Dilation and curettage of uterus    . Total knee arthroplasty  07/03/2011    Procedure: TOTAL KNEE ARTHROPLASTY;  Surgeon: Valeria BatmanPeter W Whitfield, MD;  [facility];  Service: Orthopedics;  Laterality: Right;  . Transthoracic echocardiogram  04/12/2011    EF>55%; mod concentric LVH; mild-mod TR  . Varicose vein surgery      Marijean NiemannStephanie La, MS, Provisional LDN Pager # [phone] After hours/ weekend pager # [phone]

## 2013-08-19 DIAGNOSIS — D649 Anemia, unspecified: Secondary | ICD-10-CM

## 2013-08-19 DIAGNOSIS — K921 Melena: Secondary | ICD-10-CM

## 2013-08-19 LAB — BASIC METABOLIC PANEL
Anion gap: 10 (ref 5–15)
BUN: 20 mg/dL (ref 6–23)
CALCIUM: 8.3 mg/dL — AB (ref 8.4–10.5)
CHLORIDE: 109 meq/L (ref 96–112)
CO2: 25 mEq/L (ref 19–32)
CREATININE: 0.99 mg/dL (ref 0.50–1.10)
GFR calc Af Amer: 59 mL/min — ABNORMAL LOW (ref >90)
GFR calc non Af Amer: 51 mL/min — ABNORMAL LOW (ref >90)
GLUCOSE: 95 mg/dL (ref 70–99)
Potassium: 4.3 mEq/L (ref 3.7–5.3)
Sodium: 144 mEq/L (ref 137–147)

## 2013-08-19 LAB — CBC
HCT: 24.1 % — ABNORMAL LOW (ref 36.0–46.0)
HEMATOCRIT: 25.9 % — AB (ref 36.0–46.0)
Hemoglobin: 7.6 g/dL — ABNORMAL LOW (ref 12.0–15.0)
Hemoglobin: 8.2 g/dL — ABNORMAL LOW (ref 12.0–15.0)
MCH: 29 pg (ref 26.0–34.0)
MCH: 29.3 pg (ref 26.0–34.0)
MCHC: 31.5 g/dL (ref 30.0–36.0)
MCHC: 31.7 g/dL (ref 30.0–36.0)
MCV: 92 fL (ref 78.0–100.0)
MCV: 92.5 fL (ref 78.0–100.0)
Platelets: 212 10*3/uL (ref 150–400)
Platelets: 229 10*3/uL (ref 150–400)
RBC: 2.62 MIL/uL — ABNORMAL LOW (ref 3.87–5.11)
RBC: 2.8 MIL/uL — ABNORMAL LOW (ref 3.87–5.11)
RDW: 14.2 % (ref 11.5–15.5)
RDW: 14.3 % (ref 11.5–15.5)
WBC: 4.7 10*3/uL (ref 4.0–10.5)
WBC: 5.1 10*3/uL (ref 4.0–10.5)

## 2013-08-19 LAB — PREPARE RBC (CROSSMATCH)

## 2013-08-19 MED ORDER — METHOCARBAMOL 500 MG PO TABS
500.0000 mg | ORAL_TABLET | Freq: Two times a day (BID) | ORAL | Status: DC
  Administered 2013-08-19 – 2013-08-20 (×3): 500 mg via ORAL
  Filled 2013-08-19 (×4): qty 1

## 2013-08-19 MED ORDER — NEBIVOLOL HCL 5 MG PO TABS
5.0000 mg | ORAL_TABLET | Freq: Every day | ORAL | Status: DC
Start: 2013-08-19 — End: 2013-08-20
  Administered 2013-08-19 – 2013-08-20 (×2): 5 mg via ORAL
  Filled 2013-08-19 (×2): qty 1

## 2013-08-19 MED ORDER — PANTOPRAZOLE SODIUM 40 MG PO TBEC
40.0000 mg | DELAYED_RELEASE_TABLET | Freq: Two times a day (BID) | ORAL | Status: DC
Start: 2013-08-19 — End: 2013-08-20
  Administered 2013-08-19 – 2013-08-20 (×3): 40 mg via ORAL
  Filled 2013-08-19 (×2): qty 1

## 2013-08-19 MED ORDER — IRBESARTAN 300 MG PO TABS
300.0000 mg | ORAL_TABLET | Freq: Every day | ORAL | Status: DC
  Administered 2013-08-19 – 2013-08-20 (×2): 300 mg via ORAL
  Filled 2013-08-19 (×2): qty 1

## 2013-08-19 MED ORDER — LEVOTHYROXINE SODIUM 88 MCG PO TABS
88.0000 ug | ORAL_TABLET | Freq: Every day | ORAL | Status: DC
  Administered 2013-08-19 – 2013-08-20 (×2): 88 ug via ORAL
  Filled 2013-08-19 (×3): qty 1

## 2013-08-19 MED ORDER — SODIUM CHLORIDE 0.9 % IV SOLN: Freq: Once | INTRAVENOUS | Status: DC

## 2013-08-19 MED ORDER — ZOLPIDEM TARTRATE 5 MG PO TABS
5.0000 mg | ORAL_TABLET | Freq: Every evening | ORAL | Status: DC | PRN
Start: 2013-08-19 — End: 2013-08-20
  Administered 2013-08-19: 5 mg via ORAL
  Filled 2013-08-19: qty 1

## 2013-08-19 NOTE — Progress Notes (Signed)
Patient ID: [name]  female  ZOX:[id number]    DOB: 1927/02/22    DOA: 08/17/2013  PCP: Darrow Bussing, MD  Assessment/Plan: Principal Problem:   Diverticulosis of large intestine with hemorrhage/ lower GI bleed - Hemoglobin down to 7.4 last night, patient denied any active bleeding, repeated hemoglobin 7.6, will transfuse 1 unit packed RBC - GI following, no plans of colonoscopy    Active Problems: Acute blood loss anemia: Due to #1  Chronic respiratory failure/coronary fibrosis/COPD - Currently stable continue O2, home inhalers  Hypertension: - Somewhat uncontrolled, restart bystolic and avapro   Hypothyroidism - Restart oral Synthroid  GERD - DC IV Protonix, placed on oral PPI  Mild acute renal insufficiency - Resolved  DVT Prophylaxis: SCDs  Code Status: DO NOT RESUSCITATE  Family Communication:  Disposition:  Consultants:  Gastroenterology  Procedures:  None  Antibiotics:  None    Subjective: Patient seen and examined, denies any active bleeding, no chest pain, shortness of breath, fevers or chills, weakness or dizziness  Objective: Weight change: -2.434 kg (-5 lb 5.9 oz)  Intake/Output Summary (Last 24 hours) at 08/19/13 1027 Last data filed at 08/19/13 0918  Gross per 24 hour  Intake  732.5 ml  Output      0 ml  Net  732.5 ml   Blood pressure 148/71, pulse 79, temperature 98.8 F (37.1 C), temperature source Oral, resp. rate 18, height 5\' 3"  (1.6 m), weight 51.166 kg (112 lb 12.8 oz), SpO2 98.00%.  Physical Exam: General: Alert and awake, oriented x3, not in any acute distress. CVS: S1-S2 clear, no murmur rubs or gallops Chest: clear to auscultation bilaterally, no wheezing, rales or rhonchi Abdomen: soft nontender, nondistended, normal bowel sounds  Extremities: no cyanosis, clubbing or edema noted bilaterally Neuro: Cranial nerves II-XII intact, no focal neurological deficits  Lab Results: Basic Metabolic Panel:  Recent  Labs Lab 08/18/13 0337 08/19/13 0635  NA 142 144  K 4.7 4.3  CL 107 109  CO2 25 25  GLUCOSE 87 95  BUN 31* 20  CREATININE 1.04 0.99  CALCIUM 8.7 8.3*   Liver Function Tests:  Recent Labs Lab 08/17/13 1044  AST 28  ALT 20  ALKPHOS 178*  BILITOT 0.4  PROT 7.2  ALBUMIN 3.7    Recent Labs Lab 08/17/13 1108  LIPASE 47   No results found for this basename: AMMONIA,  in the last 168 hours CBC:  Recent Labs Lab 08/17/13 1044  08/19/13 0635 08/19/13 0757  WBC 7.7  < > 4.7 5.1  NEUTROABS 6.1  --   --   --   HGB 10.4*  < > 7.6* 8.2*  HCT 33.4*  < > 24.1* 25.9*  MCV 91.0  < > 92.0 92.5  PLT 300  < > 212 229  < > = values in this interval not displayed. Cardiac Enzymes: No results found for this basename: CKTOTAL, CKMB, CKMBINDEX, TROPONINI,  in the last 168 hours BNP: No components found with this basename: POCBNP,  CBG: No results found for this basename: GLUCAP,  in the last 168 hours   Micro Results: No results found for this or any previous visit (from the past 240 hour(s)).  Studies/Results: Dg Chest 2 View  08/17/2013   CLINICAL DATA:  Bloody stools and history of pulmonary fibrosis.  EXAM: CHEST  2 VIEW  COMPARISON:  04/29/2013  FINDINGS: Patient rotated right. Convex right thoracic spine curvature. Midline trachea. Mild cardiomegaly. No pleural effusion or pneumothorax. Mild biapical pleural  thickening. Pulmonary fibrosis which is not significantly changed. No lobar consolidation. Cannot exclude left upper lobe subtle opacity.  IMPRESSION: Pulmonary fibrosis, without definite superimposed process. Possible left upper lobe opacity for which early infection or aspiration cannot be excluded. Consider short term radiographic followup.  Cardiomegaly without congestive failure.   Electronically Signed   By: Jeronimo GreavesKyle  Talbot M.D.   On: 08/17/2013 12:39   Ct Abdomen Pelvis W Contrast  08/17/2013   CLINICAL DATA:  Right lower quadrant cramping pain and discomfort. Rectal  bleeding.  EXAM: CT ABDOMEN AND PELVIS WITH CONTRAST  TECHNIQUE: Multidetector CT imaging of the abdomen and pelvis was performed using the standard protocol following bolus administration of intravenous contrast.  CONTRAST:  80mL OMNIPAQUE IOHEXOL 300 MG/ML  SOLN  COMPARISON:  CT scan dated 05/04/2010  FINDINGS: The liver is nodular consistent with cirrhosis. Spleen, pancreas, adrenal glands, and kidneys are normal. There is a moderate hiatal hernia, slightly larger than on the prior study.  The patient has extensive diverticulosis of the colon, most severe in the sigmoid region but there is no evidence of acute diverticulitis. Uterus and ovaries and bladder are normal. There are diffuse degenerative changes in the lumbar spine. No free air or free fluid. Extensive calcification in the abdominal aorta. There are small bilateral inguinal hernias containing nondilated small bowel.  Severe chronic obstructive and interstitial lung disease, markedly progressed. There are new areas of nodular consolidation in the right middle and lower lobes.  IMPRESSION: 1. Cirrhosis. 2. Extensive diverticulosis without diverticulitis. 3. Severe chronic interstitial and obstructive lung disease.   Electronically Signed   By: Geanie CooleyJim  Maxwell M.D.   On: 08/17/2013 15:27    Medications: Scheduled Meds: . sodium chloride   Intravenous Once  . antiseptic oral rinse  7 mL Mouth Rinse BID  . azelastine  1 spray Each Nare BID   And  . fluticasone  2 spray Each Nare BID  . feeding supplement (RESOURCE BREEZE)  1 Container Oral TID BM  . levothyroxine  44 mcg Intravenous Daily  . pantoprazole (PROTONIX) IV  40 mg Intravenous Q24H      LOS: 2 days   Bettina Warn M.D. [facility] 08/19/2013, 10:27 AM Pager: [phone]  If 7PM-7AM, please contact night-coverage [email or web address] Password [email or web address]  **Disclaimer: This note was dictated with voice recognition software. Similar sounding words can inadvertently be transcribed and  this note may contain transcription errors which may not have been corrected upon publication of note.**

## 2013-08-19 NOTE — Progress Notes (Signed)
Bleeding has slowed down. However, she did need transfusion of one unit of packed cells. Vitals are stable. Patient seems to be feeling okay.  Impression: The patient seems to be having a fairly typical course for mild to moderate GI bleeding, most likely of diverticular origin.  Plan: Continue plan as outlined in my note from yesterday. Observation, support, consider bleeding scan if bleeding accelerated.  Florencia Reasonsobert V. Ashanta Amoroso, M.D. [phone]

## 2013-08-19 NOTE — Progress Notes (Signed)
Patient received one unit of blood today in the morning with no complaints.  Patient tolerated well.

## 2013-08-19 NOTE — Progress Notes (Signed)
Note/chart reviewed. Agree with note.   Errica Dutil RD, LDN, CNSC [phone] Pager [phone] After Hours Pager   

## 2013-08-20 LAB — CBC
HCT: 27.9 % — ABNORMAL LOW (ref 36.0–46.0)
HEMOGLOBIN: 9 g/dL — AB (ref 12.0–15.0)
MCH: 28.5 pg (ref 26.0–34.0)
MCHC: 32.3 g/dL (ref 30.0–36.0)
MCV: 88.3 fL (ref 78.0–100.0)
Platelets: 200 10*3/uL (ref 150–400)
RBC: 3.16 MIL/uL — AB (ref 3.87–5.11)
RDW: 15 % (ref 11.5–15.5)
WBC: 6.1 10*3/uL (ref 4.0–10.5)

## 2013-08-20 LAB — TYPE AND SCREEN
ABO/RH(D): A POS
ANTIBODY SCREEN: NEGATIVE
Unit division: 0

## 2013-08-20 MED ORDER — PANTOPRAZOLE SODIUM 40 MG PO TBEC: 40.0000 mg | DELAYED_RELEASE_TABLET | Freq: Two times a day (BID) | ORAL | Status: DC

## 2013-08-20 MED ORDER — TRAMADOL HCL 50 MG PO TABS: 50.0000 mg | ORAL_TABLET | Freq: Four times a day (QID) | ORAL | Status: DC | PRN

## 2013-08-20 NOTE — Progress Notes (Signed)
Pt signed d/c papers, prescriptions given. All questions answered. Discussed follow up appointments, new medications, and s/sx to return for. Pt verbalized understanding, d/c papers signed, IV removed. Will continue to monitor.

## 2013-08-20 NOTE — Progress Notes (Signed)
BM w/ minimal bld this a.m.      Feels well and wants to go home.   No dizziness getting up and going to BR.  Appropriate post-transfusion rise in Hgb.  Impr:   Doubt ongoing bleeding;  At this point, fairly low risk (10-20%, by my estimate) of re-bleed.    Recomm:  I think it's ok to dischg pt, which is her preference based on above risk estimates. Reviewed signs and sx to watch for w/ respect to re-bleed.    I would recomm remaining off ASA for at least 3 weeks; thereafter, risk-benefit assessment can be done by her PCP.  Would recomm f/u w/ PCP to check bld count and overall status in approx 1 week.  I have made appt for me to see pt Oct 5th at 10:45 to discuss diagnostic options (?colonoscopy, ?hemoccults, BaE)  [name], M.D. [phone]

## 2013-08-20 NOTE — Care Management Note (Signed)
CARE MANAGEMENT NOTE 08/20/2013  Patient:  [name], [name]   Account Number:  [id number]  Date Initiated:  08/20/2013  Documentation initiated by:  Yehonatan Grandison  Subjective/Objective Assessment:   CM following for progression and d/c planning.     Action/Plan:   Met with pt and son re d/c needs, pt on oxygen at home and will continue on oxygen hs and prn. Pt has no equiptment needs or HH needs.   Anticipated DC Date:  08/21/2013   Anticipated DC Plan:  HOME/SELF CARE         Choice offered to / List presented to:             Status of service:  Completed, signed off Medicare Important Message given?  YES (If response is "NO", the following Medicare IM given date fields will be blank) Date Medicare IM given:  08/20/2013 Medicare IM given by:  Ahlia Lemanski Date Additional Medicare IM given:   Additional Medicare IM given by:    Discharge Disposition:  HOME/SELF CARE  Per UR Regulation:    If discussed at Long Length of Stay Meetings, dates discussed:    Comments:

## 2013-08-20 NOTE — Discharge Summary (Signed)
Physician Discharge Summary  Patient ID: [name] MRN: [id number] DOB/AGE: 03/05/1927 80 y.o.  Admit date: 08/17/2013 Discharge date: 08/20/2013  Primary Care Physician:  Darrow Bussing, MD  Discharge Diagnoses:   . Diverticulosis of colon with hemorrhage . Acute post-hemorrhagic anemia . GIB (gastrointestinal bleeding)  . Chronic respiratory failure . HTN (hypertension) . Hypothyroidism . GERD (gastroesophageal reflux disease) . Non-alcoholic cirrhosis . ARF (acute renal failure) . GI bleeding . Pulmonary fibrosis . Protein calorie malnutrition   Consults: Gastroenterology, Dr. Matthias Hughs  Recommendations for Outpatient Follow-up:  Please check CBC in one week Patient was recommended to remain off aspirin for at least 3 weeksthereafter, risk-benefit assessment can be done by her PCP.   Allergies:  No Known Allergies   Discharge Medications:   Medication List    STOP taking these medications       aspirin 81 MG tablet     meloxicam 15 MG tablet  Commonly known as:  MOBIC     naproxen sodium 220 MG tablet  Commonly known as:  ANAPROX      TAKE these medications       albuterol (5 MG/ML) 0.5% nebulizer solution  Commonly known as:  PROVENTIL  Take 0.5 mL (2.5 mg total) by nebulization every 6 (six) hours as needed for wheezing.     albuterol 108 (90 BASE) MCG/ACT inhaler  Commonly known as:  PROVENTIL HFA;VENTOLIN HFA  Inhale 2 puffs into the lungs every 6 (six) hours as needed. For shortness of breath     Azelastine-Fluticasone 137-50 MCG/ACT Susp  Commonly known as:  DYMISTA  Place 1 puff into the nose 2 (two) times daily.     calcium carbonate 600 MG Tabs tablet  Commonly known as:  OS-CAL  Take 600 mg by mouth daily with lunch.     GLUCOSAMINE CHONDR COMPLEX PO  Take 1 tablet by mouth daily.     irbesartan 300 MG tablet  Commonly known as:  AVAPRO  Take 1 tablet (300 mg total) by mouth daily.     levothyroxine 88 MCG tablet  Commonly  known as:  SYNTHROID, LEVOTHROID  Take 88 mcg by mouth daily before breakfast.     magnesium oxide 400 MG tablet  Commonly known as:  MAG-OX  Take 400 mg by mouth daily.     methocarbamol 500 MG tablet  Commonly known as:  ROBAXIN  Take 500 mg by mouth 2 (two) times daily.     nebivolol 5 MG tablet  Commonly known as:  BYSTOLIC  Take 1 tablet (5 mg total) by mouth daily.     NEXIUM PO  Take 22.3 mg by mouth every morning. OTC dose     pantoprazole 40 MG tablet  Commonly known as:  PROTONIX  Take 1 tablet (40 mg total) by mouth 2 (two) times daily.     SYSTANE OP  Place 1 drop into both eyes 4 (four) times daily as needed (dry eyes). For dry eyes     traMADol 50 MG tablet  Commonly known as:  ULTRAM  Take 1 tablet (50 mg total) by mouth every 6 (six) hours as needed for moderate pain.     triamterene-hydrochlorothiazide 37.5-25 MG per tablet  Commonly known as:  MAXZIDE-25  Take 1 tablet by mouth See admin instructions. No specific days. She doesn't always take it. She states she at least takes it three times weekly.         Brief H and P: For complete details please refer  to admission H and P, but in briefDorothy S Bauer is a 80 y.o. female  Past medical history of pulmonary fibrosis and diverticulosis who in the past 24 hours has started noticing significant episodes of bright red blood per rectum. In the days prior to this, she had noted that she started to have some increased dyspnea on exertion, but nothing else. She thought the blood was pouring out of her.  She came to the emergency room,and labs were done noting a hemoglobin of 10.4-baseline 12, and creatinine of 1.23 with a BUN of 40, baseline creatinine less than 1. A CT scan done noted diverticulosis but no evidence of diverticulitis. Patient also noted by chest x-ray to have a haziness in the left upper lobe concerning for infiltrate. Patient herself had noted no fever or increased cough. Hospitalists were called  for further evaluation   Hospital Course:   Diverticulosis of large intestine with hemorrhage/ lower GI bleed :  Hemoglobin down to 7.4 on 8/11 night, patient denied any active bleeding, repeated hemoglobin 7.6, patient was transfused with one unit packed RBCs. Gastroenterology was consulted, patient was seen by Dr Matthias Hughs. Per GI, recommended supportive care as despite elevated BUN on admission and history of aspirin exposure, most consistent with diverticular bleeding. Patient has remained stable and had no further recurrence of GI bleed. GI recommended to keep off aspirin for 3 weeks and elective outpatient colonoscopy once she is better to confirm the absence of any pathology such as vascular ectasia other than her known diverticular disease. Patient will need repeat CBC checked in one week by her PCP. She is tolerating regular diet and hemoglobin is 9.0, hematocrit 27.9 at discharge.    Acute blood loss anemia: Due to #1   Chronic respiratory failure/coronary fibrosis/COPD - Currently stable continue O2, home inhalers   Hypertension: Continue bystolic and avapro   Hypothyroidism : Continue oral Synthroid   GERD Continue PPI  mild acute renal insufficiency  - Resolved   Day of Discharge BP 143/57  Pulse 64  Temp(Src) 98.2 F (36.8 C) (Oral)  Resp 18  Ht 5\' 3"  (1.6 m)  Wt 54.8 kg (120 lb 13 oz)  BMI 21.41 kg/m2  SpO2 100%  Physical Exam: General: Alert and awake oriented x3 not in any acute distress. CVS: S1-S2 clear no murmur rubs or gallops Chest: clear to auscultation bilaterally, no wheezing rales or rhonchi Abdomen: soft nontender, nondistended, normal bowel sounds Extremities: no cyanosis, clubbing or edema noted bilaterally Neuro: Cranial nerves II-XII intact, no focal neurological deficits   The results of significant diagnostics from this hospitalization (including imaging, microbiology, ancillary and laboratory) are listed below for reference.    LAB  RESULTS: Basic Metabolic Panel:  Recent Labs Lab 08/18/13 0337 08/19/13 0635  NA 142 144  K 4.7 4.3  CL 107 109  CO2 25 25  GLUCOSE 87 95  BUN 31* 20  CREATININE 1.04 0.99  CALCIUM 8.7 8.3*   Liver Function Tests:  Recent Labs Lab 08/17/13 1044  AST 28  ALT 20  ALKPHOS 178*  BILITOT 0.4  PROT 7.2  ALBUMIN 3.7    Recent Labs Lab 08/17/13 1108  LIPASE 47   No results found for this basename: AMMONIA,  in the last 168 hours CBC:  Recent Labs Lab 08/17/13 1044  08/19/13 0757 08/20/13 0614  WBC 7.7  < > 5.1 6.1  NEUTROABS 6.1  --   --   --   HGB 10.4*  < > 8.2* 9.0*  HCT 33.4*  < > 25.9* 27.9*  MCV 91.0  < > 92.5 88.3  PLT 300  < > 229 200  < > = values in this interval not displayed. Cardiac Enzymes: No results found for this basename: CKTOTAL, CKMB, CKMBINDEX, TROPONINI,  in the last 168 hours BNP: No components found with this basename: POCBNP,  CBG: No results found for this basename: GLUCAP,  in the last 168 hours  Significant Diagnostic Studies:  Dg Chest 2 View  08/17/2013   CLINICAL DATA:  Bloody stools and history of pulmonary fibrosis.  EXAM: CHEST  2 VIEW  COMPARISON:  04/29/2013  FINDINGS: Patient rotated right. Convex right thoracic spine curvature. Midline trachea. Mild cardiomegaly. No pleural effusion or pneumothorax. Mild biapical pleural thickening. Pulmonary fibrosis which is not significantly changed. No lobar consolidation. Cannot exclude left upper lobe subtle opacity.  IMPRESSION: Pulmonary fibrosis, without definite superimposed process. Possible left upper lobe opacity for which early infection or aspiration cannot be excluded. Consider short term radiographic followup.  Cardiomegaly without congestive failure.   Electronically Signed   By: Jeronimo GreavesKyle  Talbot M.D.   On: 08/17/2013 12:39   Ct Abdomen Pelvis W Contrast  08/17/2013   CLINICAL DATA:  Right lower quadrant cramping pain and discomfort. Rectal bleeding.  EXAM: CT ABDOMEN AND PELVIS  WITH CONTRAST  TECHNIQUE: Multidetector CT imaging of the abdomen and pelvis was performed using the standard protocol following bolus administration of intravenous contrast.  CONTRAST:  80mL OMNIPAQUE IOHEXOL 300 MG/ML  SOLN  COMPARISON:  CT scan dated 05/04/2010  FINDINGS: The liver is nodular consistent with cirrhosis. Spleen, pancreas, adrenal glands, and kidneys are normal. There is a moderate hiatal hernia, slightly larger than on the prior study.  The patient has extensive diverticulosis of the colon, most severe in the sigmoid region but there is no evidence of acute diverticulitis. Uterus and ovaries and bladder are normal. There are diffuse degenerative changes in the lumbar spine. No free air or free fluid. Extensive calcification in the abdominal aorta. There are small bilateral inguinal hernias containing nondilated small bowel.  Severe chronic obstructive and interstitial lung disease, markedly progressed. There are new areas of nodular consolidation in the right middle and lower lobes.  IMPRESSION: 1. Cirrhosis. 2. Extensive diverticulosis without diverticulitis. 3. Severe chronic interstitial and obstructive lung disease.   Electronically Signed   By: Geanie CooleyJim  Maxwell M.D.   On: 08/17/2013 15:27       Disposition and Follow-up: Discharge Instructions   Discharge instructions    Complete by:  As directed   Please stop aspirin for 3 weeks            DISPOSITION: Home  DIET: Heart healthy   TESTS THAT NEED FOLLOW-UP CBC  DISCHARGE FOLLOW-UP Follow-up Information   Follow up with Darrow BussingKOIRALA,DIBAS, MD. Schedule an appointment as soon as possible for a visit in 1 week. (for hospital follow-up, obtain labs CBC)    Specialty:  Family Medicine   Contact information:   [address] [place] [address] [phone]       Follow up with Florencia ReasonsBUCCINI,ROBERT V, MD. Schedule an appointment as soon as possible for a visit on 10/12/2013. (for hospital follow-up at 10:45 AM)     Specialty:  Gastroenterology   Contact information:   [address] [place] [address] [phone]       Time spent on Discharge: 40 minutes Signed:   RAI,RIPUDEEP M.D. [facility] 08/20/2013, 1:11 PM Pager: [phone]   **  Disclaimer: This note was dictated with voice recognition software. Similar sounding words can inadvertently be transcribed and this note may contain transcription errors which may not have been corrected upon publication of note.**

## 2013-09-21 ENCOUNTER — Other Ambulatory Visit: Payer: Self-pay | Admitting: Internal Medicine

## 2013-10-05 ENCOUNTER — Other Ambulatory Visit: Payer: Self-pay | Admitting: Internal Medicine

## 2013-10-05 NOTE — Telephone Encounter (Signed)
Rx was sent to pharmacy electronically. 

## 2013-10-30 ENCOUNTER — Other Ambulatory Visit: Payer: Self-pay | Admitting: Internal Medicine

## 2013-10-30 DIAGNOSIS — J8410 Pulmonary fibrosis, unspecified: Secondary | ICD-10-CM

## 2013-11-02 ENCOUNTER — Ambulatory Visit (INDEPENDENT_AMBULATORY_CARE_PROVIDER_SITE_OTHER)
Admission: RE | Admit: 2013-11-02 | Discharge: 2013-11-02 | Disposition: A | Discharge status: Home / Self Care | Payer: Medicare Other | Source: Ambulatory Visit | Attending: Internal Medicine | Admitting: Internal Medicine

## 2013-11-02 ENCOUNTER — Encounter: Payer: Self-pay | Admitting: Internal Medicine

## 2013-11-02 ENCOUNTER — Ambulatory Visit (INDEPENDENT_AMBULATORY_CARE_PROVIDER_SITE_OTHER): Payer: Medicare Other | Admitting: Internal Medicine

## 2013-11-02 VITALS — BP 152/76 | HR 73 | Ht 63.0 in | Wt 119.0 lb

## 2013-11-02 DIAGNOSIS — J9611 Chronic respiratory failure with hypoxia: Secondary | ICD-10-CM

## 2013-11-02 DIAGNOSIS — J8410 Pulmonary fibrosis, unspecified: Secondary | ICD-10-CM

## 2013-11-02 DIAGNOSIS — J310 Chronic rhinitis: Secondary | ICD-10-CM

## 2013-11-02 DIAGNOSIS — Z23 Encounter for immunization: Secondary | ICD-10-CM

## 2013-11-02 LAB — PULMONARY FUNCTION TEST
DL/VA % pred: 97 %
DL/VA: 4.59 ml/min/mmHg/L
DLCO UNC % PRED: 48 %
DLCO UNC: 11.1 ml/min/mmHg
FEF 25-75 POST: 0.75 L/s
FEF 25-75 PRE: 0.88 L/s
FEF2575-%Change-Post: -14 %
FEF2575-%Pred-Post: 68 %
FEF2575-%Pred-Pre: 80 %
FEV1-%Change-Post: -5 %
FEV1-%Pred-Post: 55 %
FEV1-%Pred-Pre: 59 %
FEV1-POST: 0.94 L
FEV1-Pre: 0.99 L
FEV1FVC-%Change-Post: 2 %
FEV1FVC-%Pred-Pre: 105 %
FEV6-%CHANGE-POST: -7 %
FEV6-%PRED-PRE: 60 %
FEV6-%Pred-Post: 55 %
FEV6-POST: 1.19 L
FEV6-Pre: 1.29 L
FEV6FVC-%PRED-POST: 106 %
FEV6FVC-%Pred-Pre: 106 %
FVC-%CHANGE-POST: -7 %
FVC-%PRED-POST: 52 %
FVC-%Pred-Pre: 56 %
FVC-POST: 1.19 L
FVC-PRE: 1.29 L
POST FEV6/FVC RATIO: 100 %
Post FEV1/FVC ratio: 79 %
Pre FEV1/FVC ratio: 77 %
Pre FEV6/FVC Ratio: 100 %
RV % pred: 75 %
RV: 1.83 L
TLC % pred: 61 %
TLC: 3.02 L

## 2013-11-02 MED ORDER — IPRATROPIUM BROMIDE 0.06 % NA SOLN: 2.0000 | Freq: Four times a day (QID) | NASAL | Status: DC

## 2013-11-02 NOTE — Progress Notes (Signed)
PFT done today. Katie Welchel,CMA  

## 2013-11-02 NOTE — Progress Notes (Signed)
Subjective:     Patient ID: [name], female   DOB: 12-Sep-1927   MRN: [id number]     Primary is [name] [name]    Brief patient profile:   86 yowf never sign smoker followed by [name] remotely with h/o recurrent pna as infant and extending into adulthood as "freq bronchitis" then middle age seemed less freq, less severe and then wose x 2011/12 but never on 02 24/7 until p hosp for an illness that started in May 2014 and resulted in [facility] with the following d/c summary :    Admit date: 08/23/2012  Discharge date: 08/25/2012  Discharge Diagnoses:  CAP (community acquired pneumonia)  COPD (chronic obstructive pulmonary disease)  Chronic respiratory failure  GERD (gastroesophageal reflux disease)  Cirrhosis  Discharge Condition: much improved. Stable  Diet recommendation: regular diet as tolerated.  Filed Weights    08/23/12 1723   Weight:  56.6 kg (124 lb 12.5 oz)   History of present illness at the time of admission:  This is an 80 year old female, with known history of COPD, bronchial asthma, pulmonary fibrosis, on home O2, dyslipidemia,, hypothyroidism, HTN, GERD, Migraines, OA s/p back surgery, bilateral knee replacements and rotator cuff surgery, Liver cirrhosis, diverticulosis, chronic left foot wound, seen by Dr Zoila ShutterKenneth Hilty on 08/15/12, for worsening SOB, fevers up to 101, and chills. He arranged a chest CT scan, and on 08/16/12, called in a 5-day course of oral Avelox, which she was compliant with. Chest CT scan was done on 08/18/12, and showed multiple areas of airspace consolidation involving all lobes of both lungs, most likely due to multilobar pneumonia. Patient completed her antibiotics on 08/20/12, but because of persistent symptoms and worsening tenacious cough, she called Dr Blanchie DessertHilty's office, and was referred to the ED today. Patient complains of bilateral lower rib cage pains on deep inspiration and coughing.  Hospital Course:  CAP  Patient was treated with  triple abx (vanc/rocephin/azithro) from 8/16 - 8/18.  She improved quickly. At the time of discharge she is ambulating on   2 liters of oxygen maintaining oxygen sats of 94%. It is felt that her quick recovery is more due to steroid therapy than antibiotic therapy. She will be discharged on Levaquin for 5 more days.  Acute on Chronic Respiratory Failure  She will be discharged on a slow prednisone taper in addition to spiriva and PRN nebulizers  Pulmonary follow up has been scheduled for pulmonary fibrosis  09/05/2012 f/u ov/Wert re PF Chief Complaint  Patient presents with  . HFU    Pt states that her breathing is unchanged since hospital d/c. No new co's today.   doe x walk to road and back on RA sat 90 on return, hoarse chronically with dry mouth and eyes, worse dry mouth on spiriva. rec Stop the fish oil and spiriva I recommend you come off the hormone therapy> d/c d Increase prilosec to Take 30- 60 min before your first and last meals of the day  Goal of oxygen is keeping your oxygen levels above 90%  GERD diet    10/08/2012 f/u ov/Wert re:  Pf ? ALI ? BOOP no flare off steroids Chief Complaint  Patient presents with  . Follow-up with PFT    Breathing is unchanged. No new co's today. Using albuterol HFA twice daily adn albuterol neb once per day.  Doing better esp walking grocery cart on 02.   rec 02 use 2lpm at bedtime and only as needed  for breathing difficulty during the day Only use your albuterol as a rescue medication     04/29/2013 f/u ov/Wert re: PF Chief Complaint  Patient presents with  . Follow-up    hurts to take a deep breathe, c/o increase SOB, wheezing and chest tx constantly, prod cough foamy phlem.    Nl using 02 2lpm with grocery (not always), no longer interested in walking to road (prev "measuring stick for activity tolerance)  due to bad knee  Rarely uses neb now due to fear of fast heart rate  gen ant chest discomfort with cough/ better when she does  use saba rec Please remember to go to the lab and x-ray department downstairs for your tests - we will call you with the results when they are available. bystolic 5 mg daily  Try tramadol 100mg  every 4 hour as needed for cough or pain For nasal drainage/ throat tickle  take chlortrimeton (chlorpheniramine) 4 mg every 4 hours available over the counter (may cause drowsiness)  Please schedule a follow up office visit in 4-6 weeks, call sooner if needed pfts bring all medications including over the counters (call in meantime if they don't match up completely)   06/02/2013 f/u ov/Wert re: PF  Chief Complaint  Patient presents with  . Follow-up    Pt reports her breathing is unchanged since her last visit. Chest discomfort also no better. Her cough is some better, but still c/o wheezing.    nose dripping x 6 months, only during the day , watery. "wheezing" some better p saba but rarely uses Has nose drops > not better ? NS rec Stop chlortrimeton  Try dymista one twice daily each nostil and fill prescription and if not see your primary regarding referred to ENT Continue prilosec 20 mg Take 30- 60 min before your first and last meals of the day  Only use your albuterol as a rescue medication   Return 10/08/13 for pfts/cxr - call sooner if feel breathing or coughing are worse - bring all medications including over the counter     11/02/2013 f/u ov/Wert re:  PF / did not birng meds as requested  Chief Complaint  Patient presents with  . Follow-up    Pt states breathing is unchanged. No new co's today.    dymista works but only for 3-4 hours   Not limited by breathing from desired activities  - still able to do grocery shopping, does wear 02 with vacumming only and at hs    No obvious daytime variabilty or assoc excess mucus or cp or chest tightness, subjective wheeze overt sinus or hb symptoms. No unusual exp hx or h/o childhood asthma or knowledge of premature birth.   Sleeping ok without  nocturnal  or early am exacerbation  of respiratory  c/o's or need for noct saba. Also denies any obvious fluctuation of symptoms with weather or environmental changes or other aggravating or alleviating factors except as outlined above   Current Medications, Allergies, Past Medical History, Past Surgical History, Family History, and Social History were reviewed in [facility] electronic medical record.  ROS  The following are not active complaints unless bolded sore throat, dysphagia, dental problems, itching, sneezing,  nasal congestion or excess/ purulent secretions, ear ache,   fever, chills, sweats, unintended wt loss, lateralizing pleuritic or exertional cp, hemoptysis,  orthopnea pnd or leg swelling, presyncope, palpitations, heartburn, abdominal pain, anorexia, nausea, vomiting, diarrhea  or change in bowel or urinary habits, change in stools or  urine, dysuria,hematuria,  rash, arthralgias, visual complaints, headache, numbness weakness or ataxia or problems with walking or coordination,  change in mood/affect or memory.              Objective:   Physical Exam   Chronically ill  Less  Hoarse amb wf nad walking with cane but able to get up on table s assistance  01/05/2013     122  04/29/2013  120 > 06/02/2013  114 > 11/02/2013  119  Wt Readings from Last 3 Encounters:  11/19/12 126 lb (57.153 kg)  10/08/12 128 lb (58.06 kg)  09/05/12 125 lb 9.6 oz (56.972 kg)      HEENT: nl dentition, turbinates, and orophanx. Nl external ear canals without cough reflex   NECK :  without JVD/Nodes/TM/ nl carotid upstrokes bilaterally   LUNGS: no acc muscle use -  insp crackles post bilat,  No exp rhonchi   CV:  RRR  no s3 or murmur or increase in P2, no edema   ABD:  soft and nontender with nl excursion in the supine position. No bruits or organomegaly, bowel sounds nl  MS:  warm without deformities, calf tenderness, cyanosis or clubbing  SKIN: warm and dry without lesions     Lab  Results  Component Value Date   PROBNP 193.0* 04/29/2013      Lab Results  Component Value Date   ESRSEDRATE 44* 04/29/2013   ESRSEDRATE 42* 01/05/2013   ESRSEDRATE 45* 11/19/2012     Recent Labs Lab 04/29/13 1441  NA 139  K 4.8  CL 100  CO2 32  BUN 30*  CREATININE 1.1  GLUCOSE 107*    Recent Labs Lab 04/29/13 1441  HGB 12.2  HCT 37.0  WBC 8.6  PLT 267.0     Lab Results  Component Value Date   TSH 1.27 04/29/2013         CXR  11/02/2013 :  Stable chronic fibrotic changes without definite evidence of  superimposed infiltrate or pulmonary edema. Bilateral apical pleural  parenchymal scarring again noted.    Assessment:

## 2013-11-02 NOTE — Assessment & Plan Note (Signed)
Trial of dymista 06/03/2013 > reported worked x 3 h only 11/02/2013  C/w vaomotor rhinitis > rec trial of atovent NS

## 2013-11-02 NOTE — Assessment & Plan Note (Addendum)
-   09/05/12 reported amb off 02 with sats > 90 to mailbox and back  - 10/08/2012   Walked RA x 2 laps fast pace @ 185 stopped due to  Legs gave out with sats 94% - 11/19/2012  Walked RA  2 laps @ 185 ft each stopped due to  Weak legs - 04/29/2013  Walked RA  2 laps @ 185 ft each stopped due to fatigue, no sob or desat - 11/02/2013  Walked RA  2 laps @ 185 ft each stopped due to  Legs gave out before breathing, no desats, slow pace  11/02/2013  rec 02 2lpm at hs and prn with exertion if helps, not after exertion which unlikely to help

## 2013-11-02 NOTE — Assessment & Plan Note (Signed)
-  off prednisone since around 09/08/2012  - 10/08/2012 PFTs VC  1.45 s obst and DLCO 56 corrects to 111 - 01/05/2013  PFTs VC  1.44 s obst dlco 62 correccts to 123  - 11/02/2013  PFT's VC  1.29 (59%) dlco 48 corrects to 97%  - ESR 10/08/2012 = 59  - down to 45 11/19/12 and 42 12/291/4 > no change 04/29/13 - 11/02/2013  Walked RA  2 laps @ 185 ft each stopped due to  Legs gave out before breathing, no desats, slow pace   No evidence of sign dz progression so still feel not typical of uip and geriactric decline is a bigger factor.  Discussed in detail all the  indications, usual  risks and alternatives  relative to the benefits with patient who agrees to proceed with conservative f/u

## 2013-11-02 NOTE — Patient Instructions (Addendum)
Please schedule a follow up visit in 3 months but call sooner if needed  

## 2013-11-03 NOTE — Progress Notes (Signed)
Quick Note:  Spoke with pt and notified of results per Dr. Wert. Pt verbalized understanding and denied any questions.  ______ 

## 2013-12-02 ENCOUNTER — Encounter: Payer: Self-pay | Admitting: Internal Medicine

## 2013-12-02 ENCOUNTER — Ambulatory Visit (INDEPENDENT_AMBULATORY_CARE_PROVIDER_SITE_OTHER): Payer: Medicare Other | Admitting: Internal Medicine

## 2013-12-02 VITALS — BP 180/78 | HR 84 | Ht 64.0 in | Wt 116.4 lb

## 2013-12-02 DIAGNOSIS — R0609 Other forms of dyspnea: Secondary | ICD-10-CM

## 2013-12-02 DIAGNOSIS — K7460 Unspecified cirrhosis of liver: Secondary | ICD-10-CM

## 2013-12-02 DIAGNOSIS — J8410 Pulmonary fibrosis, unspecified: Secondary | ICD-10-CM

## 2013-12-02 DIAGNOSIS — I10 Essential (primary) hypertension: Secondary | ICD-10-CM

## 2013-12-02 MED ORDER — NEBIVOLOL HCL 10 MG PO TABS: 10.0000 mg | ORAL_TABLET | Freq: Every day | ORAL | Status: DC

## 2013-12-02 NOTE — Progress Notes (Signed)
OFFICE NOTE  Chief Complaint:  Follow-up HTN  Primary Care Physician: Darrow BussingKOIRALA,DIBAS, MD  HPI:  [name]  is an 80 year old female with a history of knee surgery on the right, low back surgery, hypertension, hypothyroidism, mild dyslipidemia and GERD. She was low risk for surgery, had an echo which showed a normal EF, but has had right lower extremity swelling. She reports she thought she had prior stripping to the left greater saphenous vein graft in the past. She underwent arterial and venous Dopplers of the legs on December 11, 2011 which showed a slightly reduced ABI on the right with an increased velocity at the popliteal artery suggesting 50% reduction, however, normal ABI on the left. The venous Dopplers indicated no evidence of venous insufficiency and all of the visualized vessels were present, including both the left and right greater saphenous veins, suggesting that if they had performed prior venous stripping that perhaps she had an accessory vessel removed but it was not the greater saphenous vein. Overall there is no clear evidence to support venous ablation or other treatment.   She has recently complained of worsening shortness of breath. She is known to have COPD and pulmonary fibrosis. Recently, perhaps over the past few weeks, she has had worsening shortness of breath ultimately requiring home oxygen. She tells me that her O2 saturations were less than 80% in the office. She was not treated with antibiotics per her report nor given steroids. I do not see a chest x-ray since 2013. At that time a CT scan was recommended to further delineate the extent of her pulmonary fibrosis. She reports that she is having ongoing fevers and chills. Her temperature actually was 100.1 last evening and her MAXIMUM TEMPERATURE was 101.9. She is not coughing up any sputum but has significant bilateral pleuritic chest pain which is worse in the axillary areas. She is markedly fatigued and  appears in mild distress.  Her laboratory work demonstrated a leukocytosis with a left shift. And the CT scan demonstrated multilobular pneumonia superimposed on lumbar fibrosis. Based on these findings I recommended starting her on Avelox which she took for 5 days. She initially had small improvement in her symptoms, but did not get substantively better. I did recommend holding her diuretic and this resulted in improved renal function. At that point I recommended she present to the hospital as she was felt to fail outpatient antibiotic therapy. She was admitted and spent several days in the hospital receiving broad-spectrum IV antibiotics with a good response. She was discharged and returned for followup with some improvement in her breathing and energy level. She does report a small amount of bilateral lower extremity edema which is dependent, probably related to the fact that we stopped her diuretic.   [name] returned and reported continued breathlessness. Unfortunately she did not have any improvement with steroids. She continues to use oxygen at night or during exertion although does feel short of breath almost all the time. She is now back on Maxzide every other day which does help with her lower extremity swelling. It should be noted that her blood pressure is elevated today and had been running in the 150 to 160 systolic range for several weeks.   [name] returns today and is clinically stable. Blood pressure is significantly elevated. She reports it has been up for several weeks. She is on irbesartan now 300 mg daily in addition to by systolic 5 mg and Maxzide. Using oxygen with exertion if  not continuously secondary to her pulmonary fibrosis.  PMHx:  Past Medical History  Diagnosis Date  . COPD (chronic obstructive pulmonary disease)   . Hyperlipemia   . Heart murmur   . Shortness of breath     with exertion  . Asthma   . Pneumonia     hx of frequent episodes of pneumonia  .  Headache(784.0)     MIgraine- Visualchanges- flashing lights and cant focus and cant see.  Marland Kitchen. GERD (gastroesophageal reflux disease)   . Arthritis   . Diverticulitis 2008  . Pulmonary fibrosis   . Hypertension   . Cirrhosis of liver not due to alcohol   . Hypothyroidism   . On home O2     2L N/C    Past Surgical History  Procedure Laterality Date  . Eye surgery  05/2008    - bilateral  . Back surgery    . Total knee arthroplasty  11/2008  . Rotator cuff repair  2008    right  . Retinal tear repair cryotherapy    . Tonsillectomy    . Appendectomy    . Dilation and curettage of uterus    . Total knee arthroplasty  07/03/2011    Procedure: TOTAL KNEE ARTHROPLASTY;  Surgeon: Valeria BatmanPeter W Whitfield, MD;  [facility];  Service: Orthopedics;  Laterality: Right;  . Transthoracic echocardiogram  04/12/2011    EF>55%; mod concentric LVH; mild-mod TR  . Varicose vein surgery      FAMHx:  Family History  Problem Relation Age of Onset  . Multiple sclerosis Sister   . Diabetes Sister   . Heart disease Sister   . Heart attack Brother   . Stroke Maternal Grandmother   . Hypertension Sister   . Hyperlipidemia Sister   . Stroke Sister     SOCHx:   reports that she quit smoking about 66 years ago. She has never used smokeless tobacco. She reports that she does not drink alcohol or use illicit drugs.  ALLERGIES:  No Known Allergies  ROS: A comprehensive review of systems was negative except for: Constitutional: positive for fatigue Respiratory: positive for dyspnea on exertion  HOME MEDS: Current Outpatient Prescriptions  Medication Sig Dispense Refill  . albuterol (PROVENTIL HFA;VENTOLIN HFA) 108 (90 BASE) MCG/ACT inhaler Inhale 2 puffs into the lungs every 6 (six) hours as needed. For shortness of breath    . albuterol (PROVENTIL) (5 MG/ML) 0.5% nebulizer solution Take 0.5 mL (2.5 mg total) by nebulization every 6 (six) hours as needed for wheezing. 20 mL 12  .  Azelastine-Fluticasone (DYMISTA) 137-50 MCG/ACT SUSP Place 1 puff into the nose 2 (two) times daily. 23 g 11  . calcium carbonate (OS-CAL) 600 MG TABS tablet Take 600 mg by mouth daily with lunch.     . Glucosamine-Chondroitin (GLUCOSAMINE CHONDR COMPLEX PO) Take 1 tablet by mouth daily.     Marland Kitchen. ipratropium (ATROVENT) 0.06 % nasal spray Place 2 sprays into both nostrils 4 (four) times daily. 15 mL 12  . irbesartan (AVAPRO) 300 MG tablet TAKE ONE TABLET BY MOUTH ONE TIME DAILY  30 tablet 7  . Iron TABS Take 1 tablet by mouth daily.    Marland Kitchen. levothyroxine (SYNTHROID, LEVOTHROID) 88 MCG tablet Take 88 mcg by mouth daily before breakfast.    . magnesium oxide (MAG-OX) 400 MG tablet Take 400 mg by mouth daily.     . methocarbamol (ROBAXIN) 500 MG tablet Take 500 mg by mouth 2 (two) times daily.     .Marland Kitchen  nebivolol (BYSTOLIC) 10 MG tablet Take 1 tablet (10 mg total) by mouth daily. 30 tablet 6  . pantoprazole (PROTONIX) 40 MG tablet Take 1 tablet (40 mg total) by mouth 2 (two) times daily. 60 tablet 4  . Polyethyl Glycol-Propyl Glycol (SYSTANE OP) Place 1 drop into both eyes 4 (four) times daily as needed (dry eyes). For dry eyes    . Probiotic Product (ALIGN) 4 MG CAPS Take 1 capsule by mouth daily.    . traMADol (ULTRAM) 50 MG tablet Take 1 tablet (50 mg total) by mouth every 6 (six) hours as needed for moderate pain. 60 tablet 0  . triamterene-hydrochlorothiazide (MAXZIDE-25) 37.5-25 MG per tablet Take 1 tablet by mouth See admin instructions. No specific days. She doesn't always take it. She states she at least takes it three times weekly.     No current facility-administered medications for this visit.    LABS/IMAGING: No results found for this or any previous visit (from the past 48 hour(s)). No results found.  VITALS: BP 180/78 mmHg  Pulse 84  Ht 5\' 4"  (1.626 m)  Wt 116 lb 6.4 oz (52.799 kg)  BMI 19.97 kg/m2  EXAM:  General appearance: Awake, no apparent distress on oxygen Neck: Supple, no  JVD Lungs: Decreased breath sounds with dry rales bilaterally Heart: Regular rate and rhythm no murmur Abdomen: Soft, scaphoid, nontender Extremities: No edema Pulses: 2+ Skin: Warm and dry Neurologic: Grossly nonfocal  EKG: Normal sinus rhythm at 84, left atrial enlargement, LVH by voltage  ASSESSMENT: 1. COPD and pulmonary fibrosis - s/p hospitalization and IV antibiotics for pneumonia 2. HTN-uncontrolled 3. Palpitations  PLAN: 1.   [name] continues to be breathless and I suspect this is due to chronic respiratory failure and pulmonary fibrosis. For the past several weeks she reports her blood pressure has been less well controlled. Her breathing seems to be stable. She denies any chest pain. She reports taking her medicines as directed. I would recommend increasing her Bystolic to 10 mg daily, following her blood pressures in notifying us of the results after about 1 week. We may need to make further medication adjustments at that time. Otherwise, she can follow-up with me in 6 months.  Chrystie Nose, MD, [facility] Attending Cardiologist [facility]  HILTY,Kenneth C 12/02/2013, 1:11 PM

## 2013-12-02 NOTE — Patient Instructions (Signed)
Your physician has recommended you make the following change in your medication: INCREASE bystolic to 10mg  daily   Your physician wants you to follow-up in: 6 months with Dr. Rennis GoldenHilty. You will receive a reminder letter in the mail two months in advance. If you don't receive a letter, please call our office to schedule the follow-up appointment.

## 2014-02-02 ENCOUNTER — Encounter: Payer: Self-pay | Admitting: Internal Medicine

## 2014-02-02 ENCOUNTER — Ambulatory Visit (INDEPENDENT_AMBULATORY_CARE_PROVIDER_SITE_OTHER): Payer: Medicare Other | Admitting: Internal Medicine

## 2014-02-02 ENCOUNTER — Telehealth: Payer: Self-pay | Admitting: Internal Medicine

## 2014-02-02 VITALS — BP 134/70 | HR 85 | Ht 64.0 in | Wt 113.0 lb

## 2014-02-02 DIAGNOSIS — J8410 Pulmonary fibrosis, unspecified: Secondary | ICD-10-CM

## 2014-02-02 DIAGNOSIS — J9611 Chronic respiratory failure with hypoxia: Secondary | ICD-10-CM

## 2014-02-02 NOTE — Telephone Encounter (Signed)
Called spoke with Maralyn SagoSarah. She reports pt thinks the ipratropium nasal sprays helps her the most. She use sit BID. Per sarah MW wanted this info. Will forward so he is aware.

## 2014-02-02 NOTE — Patient Instructions (Signed)
See Tammy NP 3 months  With your pill boxes and  all your medications, even over the counter meds, separated in two separate bags, the ones you take no matter what vs the ones you stop once you feel better and take only as needed when you feel you need them.   Tammy  will generate for you a new user friendly medication calendar that will put us all on the same page re: your medication use.     Without this process, it simply isn't possible to assure that we are providing  your outpatient care  with  the attention to detail we feel you deserve.   If we cannot assure that you're getting that kind of care,  then we cannot manage your problem effectively from this clinic.  Once you have seen Tammy and we are sure that we're all on the same page with your medication use she will arrange follow up with me.

## 2014-02-02 NOTE — Telephone Encounter (Signed)
Pt calling back.[name] ° °

## 2014-02-02 NOTE — Progress Notes (Signed)
Subjective:     Patient ID: [name], female   DOB: April 23, 1927   MRN: [id number]     Primary is [name] with [name]    Brief patient profile:  87 yowf never sign smoker followed by [name] remotely with h/o recurrent pna as infant and extending into adulthood as "freq bronchitis" then middle age seemed less freq, less severe and then wose x 2011/12 but never on 02 24/7 until p hosp for an illness that started in May 2014 and resulted in hosp [facility] with the following d/c summary :    Admit date: 08/23/2012  Discharge date: 08/25/2012  Discharge Diagnoses:  CAP (community acquired pneumonia)  COPD (chronic obstructive pulmonary disease)  Chronic respiratory failure  GERD (gastroesophageal reflux disease)  Cirrhosis         04/29/2013 f/u ov/Lafayette Dunlevy re: PF Chief Complaint  Patient presents with  . Follow-up    hurts to take a deep breathe, c/o increase SOB, wheezing and chest tx constantly, prod cough foamy phlem.    Nl using 02 2lpm with grocery (not always), no longer interested in walking to road (prev "measuring stick" for activity tolerance)  due to bad knee  Rarely uses neb now due to fear of fast heart rate  gen ant chest discomfort with cough/ better when she does use saba rec Please remember to go to the lab and x-ray department downstairs for your tests - we will call you with the results when they are available. bystolic 5 mg daily  Try tramadol  every 4 hour as needed for cough or pain For nasal drainage/ throat tickle  take chlortrimeton (chlorpheniramine) 4 mg every 4 hours available over the counter (may cause drowsiness)  Please schedule a follow up office visit in 4-6 weeks, call sooner if needed pfts bring all medications including over the counters (call in meantime if they don't match up completely)   06/02/2013 f/u ov/Deangleo Passage re: PF  Chief Complaint  Patient presents with  . Follow-up    Pt reports her breathing is unchanged since her last visit. Chest  discomfort also no better. Her cough is some better, but still c/o wheezing.    nose dripping x 6 months, only during the day , watery. "wheezing" some better p saba but rarely uses Has nose drops > not better ? NS rec Stop chlortrimeton  Try dymista one twice daily each nostil > no better so changed to atrovent ns and improved  Continue prilosec 20 mg Take 30- 60 min before your first and last meals of the day  Only use your albuterol as a rescue medication   Return 10/08/13 for pfts/cxr - call sooner if feel breathing or coughing are worse - bring all medications including over the counter     02/02/2014 f/u ov/Peniel Hass re:  No meds/ f/u PF p apparent ali  Chief Complaint  Patient presents with  . Follow-up    Pt states that her breathing is unchanged. No new co's today.     Continues on 02 2lpm at hs and prn daytime - very sedentary and really Not limited by breathing from desired activities  / min use of saba    No obvious daytime variabilty or assoc excess mucus or cp or chest tightness, subjective wheeze overt sinus or hb symptoms. No unusual exp hx or h/o childhood asthma or knowledge of premature birth.   Sleeping ok without nocturnal  or early am exacerbation  of respiratory  c/o's or need for  noct saba. Also denies any obvious fluctuation of symptoms with weather or environmental changes or other aggravating or alleviating factors except as outlined above   Current Medications, Allergies, Past Medical History, Past Surgical History, Family History, and Social History were reviewed in [facility] electronic medical record.  ROS  The following are not active complaints unless bolded sore throat, dysphagia, dental problems, itching, sneezing,  nasal congestion or excess  Secretions some better on Atrovent NS , ear ache,   fever, chills, sweats, unintended wt loss, lateralizing pleuritic or exertional cp, hemoptysis,  orthopnea pnd or leg swelling, presyncope, palpitations, heartburn,  abdominal pain, anorexia, nausea, vomiting, diarrhea  or change in bowel or urinary habits, change in stools or urine, dysuria,hematuria,  rash, arthralgias, visual complaints, headache, numbness weakness or ataxia or problems with walking or coordination,  change in mood/affect or memory.              Objective:   Physical Exam   Chronically ill  amb wf nad walking with cane but able to get up on table s assistance  01/05/2013     122  04/29/2013  120 > 06/02/2013  114 > 11/02/2013  119 > 02/02/2014 114  Wt Readings from Last 3 Encounters:  11/19/12 126 lb (57.153 kg)  10/08/12 128 lb (58.06 kg)  09/05/12 125 lb 9.6 oz (56.972 kg)      HEENT: nl dentition, turbinates, and orophanx. Nl external ear canals without cough reflex   NECK :  without JVD/Nodes/TM/ nl carotid upstrokes bilaterally   LUNGS: no acc muscle use -  insp crackles post bilat,  No exp rhonchi   CV:  RRR  no s3 or murmur or increase in P2, no edema   ABD:  soft and nontender with nl excursion in the supine position. No bruits or organomegaly, bowel sounds nl  MS:  warm without deformities, calf tenderness, cyanosis or clubbing  SKIN: warm and dry without lesions     Lab Results  Component Value Date   PROBNP 193.0* 04/29/2013      Lab Results  Component Value Date   ESRSEDRATE 44* 04/29/2013   ESRSEDRATE 42* 01/05/2013   ESRSEDRATE 45* 11/19/2012     Recent Labs Lab 04/29/13 1441  NA 139  K 4.8  CL 100  CO2 32  BUN 30*  CREATININE 1.1  GLUCOSE 107*    Recent Labs Lab 04/29/13 1441  HGB 12.2  HCT 37.0  WBC 8.6  PLT 267.0     Lab Results  Component Value Date   TSH 1.27 04/29/2013         CXR  11/02/2013 :  Stable chronic fibrotic changes without definite evidence of  superimposed infiltrate or pulmonary edema. Bilateral apical pleural  parenchymal scarring again noted.    Assessment:

## 2014-02-03 ENCOUNTER — Encounter: Payer: Self-pay | Admitting: Internal Medicine

## 2014-02-03 NOTE — Assessment & Plan Note (Signed)
-  off prednisone since around 09/08/2012  - 10/08/2012 PFTs VC  1.45 s obst and DLCO 56 corrects to 111 - 01/05/2013  PFTs VC  1.44 s obst dlco 62 correccts to 123  - 11/02/2013  PFT's VC  1.29 (59%) dlco 48 corrects to 97%  - ESR 10/08/2012 = 59  - down to 45 11/19/12 and 42 12/291/4 > no change 04/29/13 - 11/02/2013  Walked RA  2 laps @ 185 ft each stopped due to  Legs gave out before breathing, no desats, slow pace   - 02/02/2014   Walked RA x one lap @ 185 stopped due to  desat to 85 % but no sob, slow pace    I had an extended discussion with the patient reviewing all relevant studies completed to date and  lasting 15 to 20 minutes of a 25 minute visit on the following ongoing concerns:  It does appear she has mildly progressive PF but she's so sedentary unlikely to notice any benefit even if we considered one of the new PF agents so I didn't recommend any new rx today but Each maintenance medication was reviewed in detail including most importantly the difference between maintenance and as needed and under what circumstances the prns are to be used.  Please see instructions for details which were reviewed in writing and the patient given a copy.

## 2014-02-03 NOTE — Assessment & Plan Note (Signed)
Followed in Pulmonary clinic/ [facility]/ Nikola Marone   - 09/05/12 reported amb off 02 with sats > 90 to mailbox and back  - 10/08/2012   Walked RA x 2 laps fast pace @ 185 stopped due to  Legs gave out with sats 94% - 11/19/2012  Walked RA  2 laps @ 185 ft each stopped due to  Weak legs - 04/29/2013  Walked RA  2 laps @ 185 ft each stopped due to fatigue, no sob or desat - 11/02/2013  Walked RA  2 laps @ 185 ft each stopped due to  Legs gave out before breathing, no desats, slow pace - 02/02/2014   Walked RA x one lap @ 185 stopped due to  desat to 85 % but no sob, slow pace   02/02/14  rec 02 2lpm at hs and prn with  Activity more than room to room

## 2014-03-03 ENCOUNTER — Other Ambulatory Visit: Payer: Self-pay | Admitting: Internal Medicine

## 2014-03-04 ENCOUNTER — Telehealth: Payer: Self-pay | Admitting: Internal Medicine

## 2014-03-04 NOTE — Telephone Encounter (Signed)
FYI:   Pt wanted to make Dr. Rennis GoldenHilty aware that she is now on Amlodipine which was prescribed by her PCP.   Thanks

## 2014-03-04 NOTE — Telephone Encounter (Signed)
Med list updated

## 2014-03-04 NOTE — Addendum Note (Signed)
Addended by: Lindell SparELKINS, JENNA M on: 03/04/2014 11:27 AM   Modules accepted: Medications

## 2014-04-07 ENCOUNTER — Telehealth: Payer: Self-pay | Admitting: Internal Medicine

## 2014-04-07 NOTE — Telephone Encounter (Signed)
Spoke with Tresa EndoKelly and advised her to obtain form from airline to approve oxygen during the flight and bring to office for us to fill out and also contact DME company to see if they have a satelite office near where pt is traveling that they could arrange oxygen needs through.  She verbalized understanding.

## 2014-05-03 ENCOUNTER — Encounter: Payer: Self-pay | Admitting: Adult Health

## 2014-05-03 ENCOUNTER — Ambulatory Visit (INDEPENDENT_AMBULATORY_CARE_PROVIDER_SITE_OTHER): Payer: Medicare Other | Admitting: Adult Health

## 2014-05-03 VITALS — BP 134/68 | HR 73 | Temp 97.7°F | Ht 64.0 in | Wt 116.6 lb

## 2014-05-03 DIAGNOSIS — J8410 Pulmonary fibrosis, unspecified: Secondary | ICD-10-CM | POA: Diagnosis not present

## 2014-05-03 DIAGNOSIS — J9611 Chronic respiratory failure with hypoxia: Secondary | ICD-10-CM | POA: Diagnosis not present

## 2014-05-03 NOTE — Assessment & Plan Note (Signed)
Compensated on present regimen .  Patient's medications were reviewed today and patient education was given. Computerized medication calendar was adjusted/completed   follow up Dr. Sherene SiresWert  In 3-4 months

## 2014-05-03 NOTE — Patient Instructions (Signed)
Follow up with Dr. Sherene SiresWert  In 3-4 months and As needed   Follow med calendar closely and bring to each visit.

## 2014-05-03 NOTE — Assessment & Plan Note (Signed)
Compensated on O2  

## 2014-05-04 ENCOUNTER — Telehealth: Payer: Self-pay | Admitting: Internal Medicine

## 2014-05-04 NOTE — Telephone Encounter (Signed)
I do not see the form on my lookat stack

## 2014-05-04 NOTE — Telephone Encounter (Signed)
Verlon AuLeslie do you have this form?  Thanks!

## 2014-05-04 NOTE — Telephone Encounter (Signed)
Will forward message to MW and Verlon AuLeslie as this has been placed in MW's folder.

## 2014-05-05 NOTE — Telephone Encounter (Signed)
Done and placed in the fax pile

## 2014-05-05 NOTE — Telephone Encounter (Signed)
I just placed in MW's lookat

## 2014-05-05 NOTE — Telephone Encounter (Signed)
Called and spoke to pt;s daughter, [name]. Informed [name] the form is ready. Kelly requested to pick up form. Form placed up front in accordion folder. [name] verbalized understanding and denied any further questions or concerns at this time.

## 2014-05-12 NOTE — Addendum Note (Signed)
Addended by: Tommie SamsSILVA, Aliviana Burdell S on: 05/12/2014 11:24 AM   Modules accepted: Orders, Medications

## 2014-05-31 ENCOUNTER — Ambulatory Visit (INDEPENDENT_AMBULATORY_CARE_PROVIDER_SITE_OTHER): Payer: Medicare Other | Admitting: Internal Medicine

## 2014-05-31 ENCOUNTER — Encounter: Payer: Self-pay | Admitting: Internal Medicine

## 2014-05-31 VITALS — BP 140/62 | HR 67 | Ht 64.0 in | Wt 114.9 lb

## 2014-05-31 DIAGNOSIS — E43 Unspecified severe protein-calorie malnutrition: Secondary | ICD-10-CM

## 2014-05-31 DIAGNOSIS — J8410 Pulmonary fibrosis, unspecified: Secondary | ICD-10-CM

## 2014-05-31 DIAGNOSIS — R002 Palpitations: Secondary | ICD-10-CM | POA: Diagnosis not present

## 2014-05-31 DIAGNOSIS — I10 Essential (primary) hypertension: Secondary | ICD-10-CM | POA: Diagnosis not present

## 2014-05-31 NOTE — Patient Instructions (Signed)
Your physician wants you to follow-up in: 6 months with Dr. Hilty. You will receive a reminder letter in the mail two months in advance. If you don't receive a letter, please call our office to schedule the follow-up appointment.    

## 2014-06-01 NOTE — Progress Notes (Signed)
OFFICE NOTE  Chief Complaint:  Follow-up, no complaints  Primary Care Physician: Darrow BussingKOIRALA,DIBAS, MD  HPI:  [name]  is an 81 year old female with a history of knee surgery on the right, low back surgery, hypertension, hypothyroidism, mild dyslipidemia and GERD. She was low risk for surgery, had an echo which showed a normal EF, but has had right lower extremity swelling. She reports she thought she had prior stripping to the left greater saphenous vein graft in the past. She underwent arterial and venous Dopplers of the legs on December 11, 2011 which showed a slightly reduced ABI on the right with an increased velocity at the popliteal artery suggesting 50% reduction, however, normal ABI on the left. The venous Dopplers indicated no evidence of venous insufficiency and all of the visualized vessels were present, including both the left and right greater saphenous veins, suggesting that if they had performed prior venous stripping that perhaps she had an accessory vessel removed but it was not the greater saphenous vein. Overall there is no clear evidence to support venous ablation or other treatment.   She has recently complained of worsening shortness of breath. She is known to have COPD and pulmonary fibrosis. Recently, perhaps over the past few weeks, she has had worsening shortness of breath ultimately requiring home oxygen. She tells me that her O2 saturations were less than 80% in the office. She was not treated with antibiotics per her report nor given steroids. I do not see a chest x-ray since 2013. At that time a CT scan was recommended to further delineate the extent of her pulmonary fibrosis. She reports that she is having ongoing fevers and chills. Her temperature actually was 100.1 last evening and her MAXIMUM TEMPERATURE was 101.9. She is not coughing up any sputum but has significant bilateral pleuritic chest pain which is worse in the axillary areas. She is markedly fatigued  and appears in mild distress.  Her laboratory work demonstrated a leukocytosis with a left shift. And the CT scan demonstrated multilobular pneumonia superimposed on lumbar fibrosis. Based on these findings I recommended starting her on Avelox which she took for 5 days. She initially had small improvement in her symptoms, but did not get substantively better. I did recommend holding her diuretic and this resulted in improved renal function. At that point I recommended she present to the hospital as she was felt to fail outpatient antibiotic therapy. She was admitted and spent several days in the hospital receiving broad-spectrum IV antibiotics with a good response. She was discharged and returned for followup with some improvement in her breathing and energy level. She does report a small amount of bilateral lower extremity edema which is dependent, probably related to the fact that we stopped her diuretic.   [name] returned and reported continued breathlessness. Unfortunately she did not have any improvement with steroids. She continues to use oxygen at night or during exertion although does feel short of breath almost all the time. She is now back on Maxzide every other day which does help with her lower extremity swelling. It should be noted that her blood pressure is elevated today and had been running in the 150 to 160 systolic range for several weeks.   [name] returns today and is clinically stable. Blood pressure is significantly elevated. She reports it has been up for several weeks. She is on irbesartan now 300 mg daily in addition to by systolic 5 mg and Maxzide. Using oxygen with exertion  if not continuously secondary to her pulmonary fibrosis.  Ms. Rinn returns today and notably her blood pressure is much improved at 140/62. I previously increased her by systolic to 10 mg daily. She reports no significant change in shortness of breath and has significant pulmonary fibrosis. She is using  oxygen continuously. Her EKG today is unchanged.  PMHx:  Past Medical History  Diagnosis Date  . COPD (chronic obstructive pulmonary disease)   . Hyperlipemia   . Heart murmur   . Shortness of breath     with exertion  . Asthma   . Pneumonia     hx of frequent episodes of pneumonia  . Headache(784.0)     MIgraine- Visualchanges- flashing lights and cant focus and cant see.  Marland Kitchen GERD (gastroesophageal reflux disease)   . Arthritis   . Diverticulitis 2008  . Pulmonary fibrosis   . Hypertension   . Cirrhosis of liver not due to alcohol   . Hypothyroidism   . On home O2     2L N/C    Past Surgical History  Procedure Laterality Date  . Eye surgery  05/2008    - bilateral  . Back surgery    . Total knee arthroplasty  11/2008  . Rotator cuff repair  2008    right  . Retinal tear repair cryotherapy    . Tonsillectomy    . Appendectomy    . Dilation and curettage of uterus    . Total knee arthroplasty  07/03/2011    Procedure: TOTAL KNEE ARTHROPLASTY;  Surgeon: Valeria Batman, MD;  Location: [facility] OR;  Service: Orthopedics;  Laterality: Right;  . Transthoracic echocardiogram  04/12/2011    EF>55%; mod concentric LVH; mild-mod TR  . Varicose vein surgery      FAMHx:  Family History  Problem Relation Age of Onset  . Multiple sclerosis Sister   . Diabetes Sister   . Heart disease Sister   . Heart attack Brother   . Stroke Maternal Grandmother   . Hypertension Sister   . Hyperlipidemia Sister   . Stroke Sister     SOCHx:   reports that she quit smoking about 67 years ago. She has never used smokeless tobacco. She reports that she does not drink alcohol or use illicit drugs.  ALLERGIES:  No Known Allergies  ROS: A comprehensive review of systems was negative except for: Constitutional: positive for fatigue Respiratory: positive for dyspnea on exertion  HOME MEDS: Current Outpatient Prescriptions  Medication Sig Dispense Refill  . albuterol (PROVENTIL HFA;VENTOLIN  HFA) 108 (90 BASE) MCG/ACT inhaler Inhale 2 puffs into the lungs every 4 (four) hours as needed for wheezing or shortness of breath. For shortness of breath    . albuterol (PROVENTIL) (5 MG/ML) 0.5% nebulizer solution Take 0.5 mL (2.5 mg total) by nebulization every 6 (six) hours as needed for wheezing. (Patient taking differently: Take 2.5 mg by nebulization every 4 (four) hours as needed for wheezing or shortness of breath. ) 20 mL 12  . amLODipine (NORVASC) 2.5 MG tablet Take 2.5 mg by mouth every morning.     . calcium carbonate (OS-CAL) 600 MG TABS tablet Take 600 mg by mouth daily with lunch.     . diphenhydramine-acetaminophen (TYLENOL PM) 25-500 MG TABS Take 1 tablet by mouth at bedtime as needed.    . Glucosamine-Chondroitin (GLUCOSAMINE CHONDR COMPLEX PO) Take 1 tablet by mouth daily at 12 noon.     . hydrochlorothiazide (HYDRODIURIL) 25 MG tablet Take 25 mg  by mouth every morning.    Marland Kitchen ipratropium (ATROVENT) 0.06 % nasal spray Place 2 sprays into both nostrils 4 (four) times daily. (Patient taking differently: Place 2 sprays into both nostrils 4 (four) times daily as needed. ) 15 mL 12  . irbesartan (AVAPRO) 300 MG tablet TAKE ONE TABLET BY MOUTH ONE TIME DAILY  (Patient taking differently: TAKE ONE TABLET BY MOUTH every morning) 30 tablet 7  . levothyroxine (SYNTHROID, LEVOTHROID) 88 MCG tablet Take 88 mcg by mouth daily before breakfast.    . Magnesium 250 MG TABS Take 1 tablet by mouth daily at 12 noon.    . methocarbamol (ROBAXIN) 500 MG tablet Take 500 mg by mouth 2 (two) times daily as needed for muscle spasms.    . metroNIDAZOLE (METROCREAM) 0.75 % cream Apply to face as needed  2  . naproxen sodium (ANAPROX) 220 MG tablet Per bottle as needed    . nebivolol (BYSTOLIC) 10 MG tablet Take 1 tablet (10 mg total) by mouth daily. (Patient taking differently: Take 10 mg by mouth every morning. ) 30 tablet 6  . OXYGEN 2 l/m 24/7    . pantoprazole (PROTONIX) 40 MG tablet Take 1 tablet (40  mg total) by mouth 2 (two) times daily. (Patient taking differently: Take 40 mg by mouth 2 (two) times daily before a meal. ) 60 tablet 4  . Polyethyl Glycol-Propyl Glycol (SYSTANE OP) Place 1 drop into both eyes every morning.     . traMADol (ULTRAM) 50 MG tablet Take 1 tablet (50 mg total) by mouth every 6 (six) hours as needed for moderate pain. 60 tablet 0   No current facility-administered medications for this visit.    LABS/IMAGING: No results found for this or any previous visit (from the past 48 hour(s)). No results found.  VITALS: BP 140/62 mmHg  Pulse 67  Ht  (1.626 m)  Wt 114 lb 14.4 oz (52.118 kg)  BMI 19.71 kg/m2  EXAM:  General appearance: Awake, no apparent distress on oxygen Neck: Supple, no JVD Lungs: Decreased breath sounds with dry rales bilaterally Heart: Regular rate and rhythm no murmur Abdomen: Soft, scaphoid, nontender Extremities: No edema Pulses: 2+ Skin: Warm and dry Neurologic: Grossly nonfocal  EKG: Normal sinus rhythm at 67, left atrial enlargement, LVH by voltage  ASSESSMENT: 1. COPD and pulmonary fibrosis 2. HTN 3. Palpitations  PLAN: 1.   Mrs. Blanchette has been fairly stable and managed not to have any pneumonias or hospitalizations in the past 6 months. Her blood pressure is now well controlled. Palpitations are also controlled. She is on chronic oxygen for pulmonary fibrosis and reports stable shortness of breath. Plan to see her back in 6 months or sooner as necessary. No changes today.  Chrystie Nose, MD, [facility] Attending Cardiologist [facility]  Chrystie Nose 06/01/2014, 5:57 PM

## 2014-07-02 ENCOUNTER — Other Ambulatory Visit: Payer: Self-pay | Admitting: Internal Medicine

## 2014-07-02 NOTE — Telephone Encounter (Signed)
Rx(s) sent to pharmacy electronically.  

## 2014-07-08 ENCOUNTER — Other Ambulatory Visit: Payer: Self-pay | Admitting: Internal Medicine

## 2014-07-08 NOTE — Telephone Encounter (Signed)
REFILL 

## 2014-07-14 ENCOUNTER — Ambulatory Visit (INDEPENDENT_AMBULATORY_CARE_PROVIDER_SITE_OTHER): Payer: Medicare Other | Admitting: Internal Medicine

## 2014-07-14 ENCOUNTER — Encounter: Payer: Self-pay | Admitting: Internal Medicine

## 2014-07-14 ENCOUNTER — Ambulatory Visit (INDEPENDENT_AMBULATORY_CARE_PROVIDER_SITE_OTHER)
Admission: RE | Admit: 2014-07-14 | Discharge: 2014-07-14 | Disposition: A | Discharge status: Home / Self Care | Payer: Medicare Other | Source: Ambulatory Visit | Attending: Internal Medicine | Admitting: Internal Medicine

## 2014-07-14 VITALS — BP 140/60 | HR 75 | Temp 98.0°F | Ht 64.0 in | Wt 104.0 lb

## 2014-07-14 DIAGNOSIS — J9611 Chronic respiratory failure with hypoxia: Secondary | ICD-10-CM

## 2014-07-14 DIAGNOSIS — J8410 Pulmonary fibrosis, unspecified: Secondary | ICD-10-CM

## 2014-07-14 MED ORDER — PREDNISONE 10 MG PO TABS: ORAL_TABLET | ORAL | Status: DC

## 2014-07-14 MED ORDER — TRAMADOL HCL 50 MG PO TABS: ORAL_TABLET | ORAL | Status: DC

## 2014-07-14 MED ORDER — AZITHROMYCIN 250 MG PO TABS: ORAL_TABLET | ORAL | Status: DC

## 2014-07-14 NOTE — Progress Notes (Signed)
Subjective:     Patient ID: Diana GraffDorothy S Bauer, female   DOB: 03/29/1927   MRN: [id number]     Primary is [name] with [name]    Brief patient profile:  87 yowf never sign smoker followed by [name] remotely with h/o recurrent pna as infant and extending into adulthood as "freq bronchitis" then middle age seemed less freq, less severe and then wose x 2011/12 but never on 02 24/7 until p hosp for an illness that started in May 2014 and resulted in hosp at [facility] with the following d/c summary :    Admit date: 08/23/2012  Discharge date: 08/25/2012  Discharge Diagnoses:  CAP (community acquired pneumonia)  COPD (chronic obstructive pulmonary disease)  Chronic respiratory failure  GERD (gastroesophageal reflux disease)  Cirrhosis         04/29/2013 f/u ov/[name] re: PF Chief Complaint  Patient presents with  . Follow-up    hurts to take a deep breathe, c/o increase SOB, wheezing and chest tx constantly, prod cough foamy phlem.    Nl using 02 2lpm with grocery (not always), no longer interested in walking to road (prev "measuring stick" for activity tolerance)  due to bad knee  Rarely uses neb now due to fear of fast heart rate  gen ant chest discomfort with cough/ better when she does use saba rec Please remember to go to the lab and x-ray department downstairs for your tests - we will call you with the results when they are available. bystolic 5 mg daily  Try tramadol 100mg  every 4 hour as needed for cough or pain For nasal drainage/ throat tickle  take chlortrimeton (chlorpheniramine) 4 mg every 4 hours available over the counter (may cause drowsiness)  Please schedule a follow up office visit in 4-6 weeks, call sooner if needed pfts bring all medications including over the counters (call in meantime if they don't match up completely)   06/02/2013 f/u ov/[name] re: PF  Chief Complaint  Patient presents with  . Follow-up    Pt reports her breathing is unchanged since her last visit. Chest  discomfort also no better. Her cough is some better, but still c/o wheezing.    nose dripping x 6 months, only during the day , watery. "wheezing" some better p saba but rarely uses Has nose drops > not better ? NS rec Stop chlortrimeton  Try dymista one twice daily each nostil > no better so changed to atrovent ns and improved  Continue prilosec 20 mg Take 30- 60 min before your first and last meals of the day  Only use your albuterol as a rescue medication   Return 10/08/13 for pfts/cxr - call sooner if feel breathing or coughing are worse - bring all medications including over the counter     02/02/2014 f/u ov/[name] re:  No meds/ f/u PF p apparent ali  Chief Complaint  Patient presents with  . Follow-up    Pt states that her breathing is unchanged. No new co's today.   Continues on 02 2lpm at hs and prn daytime - very sedentary and really Not limited by breathing from desired activities  / min use of saba rec Med calendar     07/14/2014  Acute  ov/[name] re:  No meds/ no med caledar  Chief Complaint  Patient presents with  . Follow-up    Pt c/o increased SOB, wheezing, prod cough with white mucus x 1 month. Fever at bedtime.     Breathing worse x one month,  then suddenly worse x 2 days ago but still comfortable at rest   No obvious daytime variabilty or assoc cp or chest tightness,  overt sinus or hb symptoms. No unusual exp hx or h/o childhood asthma or knowledge of premature birth.   Sleeping ok without nocturnal  or early am exacerbation  of respiratory  c/o's or need for noct saba. Also denies any obvious fluctuation of symptoms with weather or environmental changes or other aggravating or alleviating factors except as outlined above   Current Medications, Allergies, Past Medical History, Past Surgical History, Family History, and Social History were reviewed in [name] record.  ROS  The following are not active complaints unless bolded sore throat,  dysphagia, dental problems, itching, sneezing,  nasal congestion or excess  Secretions some better on Atrovent NS , ear ache,   fever, chills, sweats, unintended wt loss, lateralizing pleuritic or exertional cp, hemoptysis,  orthopnea pnd or leg swelling, presyncope, palpitations, heartburn, abdominal pain, anorexia, nausea, vomiting, diarrhea  or change in bowel or urinary habits, change in stools or urine, dysuria,hematuria,  rash, arthralgias, visual complaints, headache, numbness weakness or ataxia or problems with walking or coordination,  change in mood/affect or memory.              Objective:   Physical Exam   Chronically ill  amb wf nad walking with cane but able to get up on table with one p  assistance  01/05/2013     122  04/29/2013  120 > 06/02/2013  114 > 11/02/2013  119 > 02/02/2014 114 > 07/14/2014  104  Wt Readings from Last 3 Encounters:  11/19/12 126 lb (57.153 kg)  10/08/12 128 lb (58.06 kg)  09/05/12 125 lb 9.6 oz (56.972 kg)      HEENT: nl dentition, turbinates, and orophanx. Nl external ear canals without cough reflex   NECK :  without JVD/Nodes/TM/ nl carotid upstrokes bilaterally   LUNGS: no acc muscle use -  insp crackles posteriorly  bilat,  No exp rhonchi   CV:  RRR  no s3 or murmur or increase in P2, no edema   ABD:  soft and nontender with nl excursion in the supine position. No bruits or organomegaly, bowel sounds nl  MS:  warm without deformities, calf tenderness, cyanosis or clubbing  SKIN: warm and dry without lesions         CXR PA and Lateral:   07/14/2014 :     I personally reviewed images and agree with radiology impression as follows:    Extensive pulmonary fibrotic changes. There is no evidence of acute pneumonia nor pulmonary edema.           Assessment:

## 2014-07-14 NOTE — Patient Instructions (Addendum)
zpak  Prednisone 10 mg take  4 each am x 2 days,   2 each am x 2 days,  1 each am x 2 days and stop   For cough>> Take delsym two tsp every 12 hours and supplement if needed with  tramadol 50 mg up to 1 every 4 hours to suppress the urge to cough. Swallowing water or using ice chips/non mint and menthol containing candies (such as lifesavers or sugarless jolly ranchers) are also effective.  You should rest your voice and avoid activities that you know make you cough.  Once you have eliminated the cough for 3 straight days try reducing the tramadol first,  then the delsym as tolerated.    Please remember to go to the   x-ray department downstairs for your tests - we will call you with the results when they are available.  Please schedule a follow up office visit in 4 weeks, sooner if needed to see Tammy with all your medications to regroup.   - needs walking sats on 02 at next ov

## 2014-07-15 NOTE — Progress Notes (Signed)
Quick Note:  Spoke with pt and notified of results per Dr. Wert. Pt verbalized understanding and denied any questions.  ______ 

## 2014-07-18 ENCOUNTER — Encounter: Payer: Self-pay | Admitting: Internal Medicine

## 2014-07-18 NOTE — Assessment & Plan Note (Addendum)
-  off prednisone since around 09/08/2012  - 10/08/2012 PFTs VC  1.45 s obst and DLCO 56 corrects to 111 - 01/05/2013  PFTs VC  1.44 s obst dlco 62 correccts to 123  - 11/02/2013  PFT's VC  1.29 (59%) dlco 48 corrects to 97%  - ESR 10/08/2012 = 59  - down to 45 11/19/12 and 42 12/291/4 > no change 04/29/13 - 11/02/2013  Walked RA  2 laps @ 185 ft each stopped due to  Legs gave out before breathing, no desats, slow pace   - 02/02/2014   Walked RA x one lap @ 185 stopped due to  desat to 85 % but no sob, slow pace  -med calendar 05/03/2014 > not using 07/14/14   Uri vs flare in PF > rx zpak/ pred x 6 days only   I had an extended discussion with the patient reviewing all relevant studies completed to date and  lasting 15 to 20 minutes of a 25 minute visit on the following ongoing concerns:   1) reviewed how difficult it is in pts with pf sorting out acute syndromes from the pf natural hx   2) explained that we generated the med calendar for her on the last visit  a reason and she did not apparently "buy in"   3) Each maintenance medication was reviewed in detail including most importantly the difference between maintenance and as needed and under what circumstances the prns are to be used. This was done in the context of a medication calendar review which provided the patient with a user-friendly unambiguous mechanism for medication administration and reconciliation and provides an action plan for all active problems. It is critical that this be shown to every doctor  for modification during the office visit if necessary so the patient can use it as a working document.

## 2014-07-18 NOTE — Assessment & Plan Note (Signed)
-   09/05/12 reported amb off 02 with sats > 90 to mailbox and back  - 10/08/2012   Walked RA x 2 laps fast pace @ 185 stopped due to  Legs gave out with sats 94% - 11/19/2012  Walked RA  2 laps @ 185 ft each stopped due to  Weak legs - 04/29/2013  Walked RA  2 laps @ 185 ft each stopped due to fatigue, no sob or desat - 11/02/2013  Walked RA  2 laps @ 185 ft each stopped due to  Legs gave out before breathing, no desats, slow pace - 02/02/2014   Walked RA x one lap @ 185 stopped due to  desat to 85 % but no sob, slow pace   02/02/14  rec 02 2lpm at hs and prn with  Activity more than room to room   Despite flare in symptoms > Adequate control on present rx, reviewed > no change in rx needed

## 2014-08-12 ENCOUNTER — Ambulatory Visit (INDEPENDENT_AMBULATORY_CARE_PROVIDER_SITE_OTHER): Payer: Medicare Other | Admitting: Adult Health

## 2014-08-12 ENCOUNTER — Encounter: Payer: Self-pay | Admitting: Adult Health

## 2014-08-12 VITALS — BP 120/62 | HR 69 | Temp 97.4°F | Ht 64.0 in | Wt 117.0 lb

## 2014-08-12 DIAGNOSIS — J8410 Pulmonary fibrosis, unspecified: Secondary | ICD-10-CM

## 2014-08-12 DIAGNOSIS — J310 Chronic rhinitis: Secondary | ICD-10-CM | POA: Diagnosis not present

## 2014-08-12 NOTE — Patient Instructions (Signed)
Continue on current regimen  Follow up with Dr. Sherene Sires  In 3-4 months and As needed   Follow med calendar closely and bring to each visit.

## 2014-08-12 NOTE — Progress Notes (Signed)
Subjective:     Patient ID: [name], female   DOB: [birth year]   MRN: [id number]     Primary is [name] with [name]    Brief patient profile:  87 yowf never sign smoker followed by [name] remotely with h/o recurrent pna as infant and extending into adulthood as "freq bronchitis" then middle age seemed less freq, less severe and then wose x 2011/12 but never on 02 24/7 until p hosp for an illness that started in May 2014 and resulted [facility] with the following d/c summary :    Admit date: 08/23/2012  Discharge date: 08/25/2012  Discharge Diagnoses:  CAP (community acquired pneumonia)  COPD (chronic obstructive pulmonary disease)  Chronic respiratory failure  GERD (gastroesophageal reflux disease)  Cirrhosis         04/29/2013 f/u ov/Wert re: PF Chief Complaint  Patient presents with  . Follow-up    hurts to take a deep breathe, c/o increase SOB, wheezing and chest tx constantly, prod cough foamy phlem.    Nl using 02 2lpm with grocery (not always), no longer interested in walking to road (prev "measuring stick" for activity tolerance)  due to bad knee  Rarely uses neb now due to fear of fast heart rate  gen ant chest discomfort with cough/ better when she does use saba rec Please remember to go to the lab and x-ray department downstairs for your tests - we will call you with the results when they are available. bystolic 5 mg daily  Try tramadol  every 4 hour as needed for cough or pain For nasal drainage/ throat tickle  take chlortrimeton (chlorpheniramine) 4 mg every 4 hours available over the counter (may cause drowsiness)  Please schedule a follow up office visit in 4-6 weeks, call sooner if needed pfts bring all medications including over the counters (call in meantime if they don't match up completely)   06/02/2013 f/u ov/Wert re: PF  Chief Complaint  Patient presents with  . Follow-up    Pt reports her breathing is unchanged since her last visit. Chest  discomfort also no better. Her cough is some better, but still c/o wheezing.    nose dripping x 6 months, only during the day , watery. "wheezing" some better p saba but rarely uses Has nose drops > not better ? NS rec Stop chlortrimeton  Try dymista one twice daily each nostil > no better so changed to atrovent ns and improved  Continue prilosec 20 mg Take 30- 60 min before your first and last meals of the day  Only use your albuterol as a rescue medication   Return 10/08/13 for pfts/cxr - call sooner if feel breathing or coughing are worse - bring all medications including over the counter     02/02/2014 f/u ov/Wert re:  No meds/ f/u PF p apparent ali  Chief Complaint  Patient presents with  . Follow-up    Pt states that her breathing is unchanged. No new co's today.   Continues on 02 2lpm at hs and prn daytime - very sedentary and really Not limited by breathing from desired activities  / min use of saba rec Med calendar     07/14/2014  Acute  ov/Wert re:  No meds/ no med caledar :  Chief Complaint  Patient presents with  . Follow-up    Pt c/o increased SOB, wheezing, prod cough with white mucus x 1 month. Fever at bedtime.    Breathing worse x one month,  then suddenly worse x 2 days ago but still comfortable at rest  >>zpack and pred taper   08/12/2014 Follow up /Med Calendar : Yeimy Brabant OV : PF  Pt returns for a follow up and med calendar  We reviewed all her meds and organized them into a med calendar with pt education.  Appears to be taking correctly.  Last ov with bronchitis , tx w/ zpack and pred taper.  She is feeling better. Has some lingering cough but improved.  Gets winded easily but at baseline.  PVX and Prevnar utd.  CXR last ov with chronic fibrotic changes.  She denies chest pain, orthopnea, edema , fever or hemoptysis.    Current Medications, Allergies, Past Medical History, Past Surgical History, Family History, and Social History were reviewed in [employer] record.  ROS  The following are not active complaints unless bolded sore throat, dysphagia, dental problems, itching, sneezing,  nasal congestion or excess   , ear ache,   fever, chills, sweats, unintended wt loss, lateralizing pleuritic or exertional cp, hemoptysis,  orthopnea pnd or leg swelling, presyncope, palpitations, heartburn, abdominal pain, anorexia, nausea, vomiting, diarrhea  or change in bowel or urinary habits, change in stools or urine, dysuria,hematuria,  rash, arthralgias, visual complaints, headache, numbness weakness or ataxia or problems with walking or coordination,  change in mood/affect or memory.              Objective:   Physical Exam   Chronically ill  amb wf nad walking with cane but able to get up on table with one p  assistance  01/05/2013     122  04/29/2013  120 > 06/02/2013  114 > 11/02/2013  119 > 02/02/2014 114 > 07/14/2014  104 >117 08/13/2014    HEENT: nl dentition, turbinates, and orophanx. Nl external ear canals without cough reflex   NECK :  without JVD/Nodes/TM/ nl carotid upstrokes bilaterally   LUNGS: no acc muscle use -  insp crackles posteriorly  bilat,     CV:  RRR  no s3 or murmur or increase in P2, no edema   ABD:  soft and nontender with nl excursion in the supine position. No bruits or organomegaly, bowel sounds nl  MS:  warm without deformities, calf tenderness, cyanosis or clubbing  SKIN: warm and dry without lesions         CXR PA and Lateral:   07/14/2014 :       Extensive pulmonary fibrotic changes. There is no evidence of acute pneumonia nor pulmonary edema.           Assessment:

## 2014-08-13 NOTE — Addendum Note (Signed)
Addended by: Karalee Height on: 08/13/2014 12:29 PM   Modules accepted: Orders, Medications

## 2014-08-13 NOTE — Progress Notes (Signed)
Chart and office note reviewed in detail  > agree with a/p as outlined    

## 2014-08-13 NOTE — Assessment & Plan Note (Signed)
Controlled on rx  Cont on current regimen.

## 2014-08-13 NOTE — Assessment & Plan Note (Signed)
Mild flare with bronchitis last ov , improved with steroids.  Patient's medications were reviewed today and patient education was given. Computerized medication calendar was adjusted/completed   Plan  Continue on current regimen  Follow up with Dr. Sherene Sires  In 3-4 months and As needed   Follow med calendar closely and bring to each visit.

## 2014-08-19 ENCOUNTER — Telehealth: Payer: Self-pay | Admitting: Internal Medicine

## 2014-08-19 ENCOUNTER — Encounter: Payer: Self-pay | Admitting: Internal Medicine

## 2014-08-19 ENCOUNTER — Ambulatory Visit (INDEPENDENT_AMBULATORY_CARE_PROVIDER_SITE_OTHER): Payer: Medicare Other | Admitting: Internal Medicine

## 2014-08-19 VITALS — BP 118/60 | HR 81 | Ht 64.0 in | Wt 116.2 lb

## 2014-08-19 DIAGNOSIS — R042 Hemoptysis: Secondary | ICD-10-CM | POA: Diagnosis not present

## 2014-08-19 DIAGNOSIS — J9611 Chronic respiratory failure with hypoxia: Secondary | ICD-10-CM

## 2014-08-19 MED ORDER — PREDNISONE 20 MG PO TABS: 20.0000 mg | ORAL_TABLET | Freq: Every day | ORAL | Status: DC

## 2014-08-19 MED ORDER — AMOXICILLIN-POT CLAVULANATE 500-125 MG PO TABS: ORAL_TABLET | ORAL | Status: DC

## 2014-08-19 NOTE — Progress Notes (Signed)
Subjective:     Patient ID: [name], female   DOB: 03/29/1927   MRN: [id number]     Primary is [name] with [name]    Brief patient profile:  87 yowf never sign smoker followed by [name] remotely with h/o recurrent pna as infant and extending into adulthood as "freq bronchitis" then middle age seemed less freq, less severe and then wose x 2011/12 but never on 02 24/7 until p hosp for an illness that started in May 2014 and resulted in [facility] with the following d/c summary :    Admit date: 08/23/2012  Discharge date: 08/25/2012  Discharge Diagnoses:  CAP (community acquired pneumonia)  COPD (chronic obstructive pulmonary disease)  Chronic respiratory failure  GERD (gastroesophageal reflux disease)  Cirrhosis         04/29/2013 f/u ov/Wert re: PF Chief Complaint  Patient presents with  . Follow-up    hurts to take a deep breathe, c/o increase SOB, wheezing and chest tx constantly, prod cough foamy phlem.    Nl using 02 2lpm with grocery (not always), no longer interested in walking to road (prev "measuring stick" for activity tolerance)  due to bad knee  Rarely uses neb now due to fear of fast heart rate  gen ant chest discomfort with cough/ better when she does use saba rec Please remember to go to the lab and x-ray department downstairs for your tests - we will call you with the results when they are available. bystolic 5 mg daily  Try tramadol 100mg  every 4 hour as needed for cough or pain For nasal drainage/ throat tickle  take chlortrimeton (chlorpheniramine) 4 mg every 4 hours available over the counter (may cause drowsiness)  Please schedule a follow up office visit in 4-6 weeks, call sooner if needed pfts bring all medications including over the counters (call in meantime if they don't match up completely)   06/02/2013 f/u ov/Wert re: PF  Chief Complaint  Patient presents with  . Follow-up    Pt reports her breathing is unchanged since her last visit. Chest  discomfort also no better. Her cough is some better, but still c/o wheezing.    nose dripping x 6 months, only during the day , watery. "wheezing" some better p saba but rarely uses Has nose drops > not better ? NS rec Stop chlortrimeton  Try dymista one twice daily each nostil > no better so changed to atrovent ns and improved  Continue prilosec 20 mg Take 30- 60 min before your first and last meals of the day  Only use your albuterol as a rescue medication   Return 10/08/13 for pfts/cxr - call sooner if feel breathing or coughing are worse - bring all medications including over the counter     02/02/2014 f/u ov/Wert re:  No meds/ f/u PF p apparent ali  Chief Complaint  Patient presents with  . Follow-up    Pt states that her breathing is unchanged. No new co's today.   Continues on 02 2lpm at hs and prn daytime - very sedentary and really Not limited by breathing from desired activities  / min use of saba rec Med calendar     07/14/2014  Acute  ov/Wert re:  No meds/ no med caledar  Chief Complaint  Patient presents with  . Follow-up    Pt c/o increased SOB, wheezing, prod cough with white mucus x 1 month. Fever at bedtime.     Breathing worse x one month,  then suddenly worse x 2 days ago but still comfortable at rest   No obvious daytime variabilty or assoc cp or chest tightness,  overt sinus or hb symptoms. No unusual exp hx or h/o childhood asthma or knowledge of premature birth.   Sleeping ok without nocturnal  or early am exacerbation  of respiratory  c/o's or need for noct saba. Also denies any obvious fluctuation of symptoms with weather or environmental changes or other aggravating or alleviating factors except as outlined above   08/19/14- 87 yoF former smoker followed by Dr Sherene Sires for chronic bronchitis, hx pneumonias O2 2L sleep and exertion/ APS   son is here ACUTE VISIT: MW patient;Pt states she was coughing today and coughed up blood tinged sputum-only had this happen  once before. Always has SOB and wheezing as well. Shows brown clot size of pencil eraser in thick clear mucus on Kleenex. Treated with Z-Pak at last visit. Small lung clot in otherwise clear/yellow sputum today. Has tramadol and Delsym for cough when needed. Continues oxygen 2 L/APS. No other bleeding or bruising. Denies chest pain, adenopathy, fever now.  CXR 07/14/14 IMPRESSION: Extensive pulmonary fibrotic changes. There is no evidence of acute pneumonia nor pulmonary edema. If the patient's symptoms have proven unresponsive to antibiotics, chest CT scanning may be a useful next imaging step to further evaluate the pulmonary parenchyma. Electronically Signed  By: David Swaziland M.D.  On: 07/14/2014 15:15  ROS-see HPI   Negative unless "+" Constitutional:    weight loss, night sweats, fevers, chills, fatigue, lassitude. HEENT:    headaches, difficulty swallowing, tooth/dental problems, sore throat,       sneezing, itching, ear ache, nasal congestion, post nasal drip, snoring CV:    chest pain, orthopnea, PND, swelling in lower extremities, anasarca,                                                       dizziness, palpitations Resp:   shortness of breath with exertion or at rest.               + productive cough,   non-productive cough, +coughing up of blood.              change in color of mucus.  wheezing.   Skin:    rash or lesions. GI:  No-   heartburn, indigestion, abdominal pain, nausea, vomiting,e GU:  MS:   joint pain, stiffness,  Neuro-     nothing unusual Psych:  change in mood or affect.  depression or anxiety.   memory loss.    Objective:    OBJ- Physical Exam General- Alert, Oriented, Affect-appropriate, Distress- none acute, O2 2L POC Skin- rash-none, lesions- none, excoriation- none Lymphadenopathy- none Head- atraumatic            Eyes- Gross vision intact, PERRLA, conjunctivae and secretions clear            Ears- Hearing, canals-normal            Nose- Clear,  no-Septal dev, mucus, polyps, erosion, perforation             Throat- Mallampati II , mucosa clear , drainage- none, tonsils- atrophic Neck- flexible , trachea midline, no stridor , thyroid nl, carotid no bruit Chest - symmetrical excursion , unlabored  Heart/CV- RRR , no murmur , no gallop  , no rub, nl s1 s2                           - JVD- none , edema- none, stasis changes- none, varices- none           Lung- + decreased, rhonchi-none, wheeze- none, cough- none , dullness-none, rub- none           Chest wall-  Abd-  Br/ Gen/ Rectal- Not done, not indicated Extrem- cyanosis- none, clubbing, none, atrophy- none, strength- nl Neuro- grossly intact to observation  Assessment:

## 2014-08-19 NOTE — Patient Instructions (Addendum)
Script sent for augmentin antibiotic  Script sent for prednisone for 4 days  Ok to use the tramadol and Delsym for cough as needed    Please call as needed  Please make an appointment to see Dr Sherene Sires again for recheck in next month or so.

## 2014-08-19 NOTE — Telephone Encounter (Signed)
Called spoke with pt. appt scheduled to see CDY today at 2. Nothing further needed

## 2014-08-20 DIAGNOSIS — R042 Hemoptysis: Secondary | ICD-10-CM | POA: Insufficient documentation

## 2014-08-20 NOTE — Assessment & Plan Note (Addendum)
.   Known oxygen-dependent without recent change in oxygen requirement. She continues 2 L continuously.Marland Kitchen

## 2014-08-20 NOTE — Assessment & Plan Note (Signed)
CT chest from 08/2012 and most recent chest x-ray suggest post inflammatory scarring. Together with known cirrhosis, small areas of bronchitis/bronchiectasis are likely cause for hemoptysis. I favor benign etiology at this time. Plan-short course prednisone, Augmentin, patient to call if hemoptysis continues through the weekend

## 2014-08-30 ENCOUNTER — Telehealth: Payer: Self-pay | Admitting: Internal Medicine

## 2014-08-30 NOTE — Telephone Encounter (Signed)
Jan with [facility] supply called to suggest that F[place] Pho have a change in her compression hose to change from 20/30 to 15/20  Dr. Ladon Applebaum would like to change the compression of her knee high hose to 15/20

## 2014-09-20 ENCOUNTER — Ambulatory Visit (INDEPENDENT_AMBULATORY_CARE_PROVIDER_SITE_OTHER): Payer: Medicare Other | Admitting: Internal Medicine

## 2014-09-20 ENCOUNTER — Other Ambulatory Visit (INDEPENDENT_AMBULATORY_CARE_PROVIDER_SITE_OTHER): Payer: Medicare Other

## 2014-09-20 ENCOUNTER — Encounter: Payer: Self-pay | Admitting: Internal Medicine

## 2014-09-20 VITALS — BP 136/62 | HR 82 | Ht 64.0 in | Wt 115.0 lb

## 2014-09-20 DIAGNOSIS — J8410 Pulmonary fibrosis, unspecified: Secondary | ICD-10-CM

## 2014-09-20 DIAGNOSIS — R0600 Dyspnea, unspecified: Secondary | ICD-10-CM | POA: Diagnosis not present

## 2014-09-20 DIAGNOSIS — J9611 Chronic respiratory failure with hypoxia: Secondary | ICD-10-CM

## 2014-09-20 LAB — CBC WITH DIFFERENTIAL/PLATELET
BASOS PCT: 0.3 % (ref 0.0–3.0)
Basophils Absolute: 0 10*3/uL (ref 0.0–0.1)
EOS ABS: 0 10*3/uL (ref 0.0–0.7)
Eosinophils Relative: 0 % (ref 0.0–5.0)
HEMATOCRIT: 35.1 % — AB (ref 36.0–46.0)
Hemoglobin: 11.6 g/dL — ABNORMAL LOW (ref 12.0–15.0)
LYMPHS ABS: 1.2 10*3/uL (ref 0.7–4.0)
LYMPHS PCT: 14.8 % (ref 12.0–46.0)
MCHC: 33 g/dL (ref 30.0–36.0)
MCV: 89.1 fl (ref 78.0–100.0)
MONO ABS: 0.9 10*3/uL (ref 0.1–1.0)
Monocytes Relative: 11.3 % (ref 3.0–12.0)
Neutro Abs: 6.1 10*3/uL (ref 1.4–7.7)
Neutrophils Relative %: 73.6 % (ref 43.0–77.0)
PLATELETS: 282 10*3/uL (ref 150.0–400.0)
RBC: 3.94 Mil/uL (ref 3.87–5.11)
RDW: 14.7 % (ref 11.5–15.5)
WBC: 8.3 10*3/uL (ref 4.0–10.5)

## 2014-09-20 LAB — SEDIMENTATION RATE: Sed Rate: 67 mm/hr — ABNORMAL HIGH (ref 0–22)

## 2014-09-20 MED ORDER — PREDNISONE 10 MG PO TABS
ORAL_TABLET | ORAL | Status: DC
Start: 2014-09-20 — End: 2014-09-28

## 2014-09-20 NOTE — Patient Instructions (Addendum)
Take  chlorpheniramine) 4 mg x1-2 at bedtime  available over the counter   If breathing or cough worse > Prednisone 10 mg take  4 each am x 2 days,   2 each am x 2 days,  1 each am x 2 days and stop  Please remember to go to the lab   department downstairs for your tests - we will call you with the results when they are available.  Wear 2lpm at bedtime and when walking more than room to room at home     Please schedule a follow up office visit in 6 weeks, call sooner if needed with pfts  next step would be HRCT chest if not improving

## 2014-09-20 NOTE — Progress Notes (Signed)
Subjective:     Patient ID: [name], female   DOB: 06/03/1927   MRN: [id number]     Primary is [name] with [name]    Brief patient profile:  32 yowf never sign smoker followed by [name] remotely with h/o recurrent pna as infant and extending into adulthood as "freq bronchitis" then middle age seemed less freq, less severe and then wose x 2011/12 but never on 02 24/7 until p hosp for an illness that started in May 2014 and resulted in [facility] with the following d/c summary :    Admit date: 08/23/2012  Discharge date: 08/25/2012  Discharge Diagnoses:  CAP (community acquired pneumonia)  COPD (chronic obstructive pulmonary disease)  Chronic respiratory failure  GERD (gastroesophageal reflux disease)  Cirrhosis         04/29/2013 f/u ov/Ashwath Lasch re: PF Chief Complaint  Patient presents with  . Follow-up    hurts to take a deep breathe, c/o increase SOB, wheezing and chest tx constantly, prod cough foamy phlem.    Nl using 02 2lpm with grocery (not always), no longer interested in walking to road (prev "measuring stick" for activity tolerance)  due to bad knee  Rarely uses neb now due to fear of fast heart rate  gen ant chest discomfort with cough/ better when she does use saba rec Please remember to go to the lab and x-ray department downstairs for your tests - we will call you with the results when they are available. bystolic 5 mg daily  Try tramadol 134m every 4 hour as needed for cough or pain For nasal drainage/ throat tickle  take chlortrimeton (chlorpheniramine) 4 mg every 4 hours available over the counter (may cause drowsiness)  Please schedule a follow up office visit in 4-6 weeks, call sooner if needed pfts bring all medications including over the counters (call in meantime if they don't match up completely)   06/02/2013 f/u ov/Lora Glomski re: PF  Chief Complaint  Patient presents with  . Follow-up    Pt reports her breathing is unchanged since her last visit. Chest  discomfort also no better. Her cough is some better, but still c/o wheezing.    nose dripping x 6 months, only during the day , watery. "wheezing" some better p saba but rarely uses Has nose drops > not better ? NS rec Stop chlortrimeton  Try dymista one twice daily each nostil > no better so changed to atrovent ns and improved  Continue prilosec 20 mg Take 30- 60 min before your first and last meals of the day  Only use your albuterol as a rescue medication   Return 10/08/13 for pfts/cxr - call sooner if feel breathing or coughing are worse - bring all medications including over the counter     02/02/2014 f/u ov/Maci Eickholt re:  No meds/ f/u PF p apparent ali  Chief Complaint  Patient presents with  . Follow-up    Pt states that her breathing is unchanged. No new co's today.   Continues on 02 2lpm at hs and prn daytime - very sedentary and really Not limited by breathing from desired activities  / min use of saba rec Med calendar   08/12/14 med calendar ov    07/14/2014  Acute  ov/Cheyenna Pankowski re:  No meds/ no med caledar  Chief Complaint  Patient presents with  . Follow-up    Pt c/o increased SOB, wheezing, prod cough with white mucus x 1 month. Fever at bedtime.  Breathing worse x one month, then suddenly worse x 2 days ago but still comfortable at rest   No obvious daytime variabilty or assoc cp or chest tightness,  overt sinus or hb symptoms. No unusual exp hx or h/o childhood asthma or knowledge of premature birth.   Sleeping ok without nocturnal  or early am exacerbation  of respiratory  c/o's or need for noct saba. Also denies any obvious fluctuation of symptoms with weather or environmental changes or other aggravating or alleviating factors except as outlined above   08/19/14- 87 yoF former smoker followed by Dr Sherene Sires for chronic bronchitis, hx pneumonias O2 2L sleep and exertion/ APS   son is here ACUTE VISIT: MW patient;Pt states she was coughing today and coughed up blood tinged  sputum-only had this happen once before. Always has SOB and wheezing as well. Shows brown clot size of pencil eraser in thick clear mucus on Kleenex. Treated with Z-Pak at last visit. Small lung clot in otherwise clear/yellow sputum today.   rec Script sent for augmentin antibiotic Script sent for prednisone for 4 days Ok to use the tramadol and Delsym for cough as needed Please make an appointment to see Dr Sherene Sires again for recheck in next month or so.   . 09/20/2014 f/u ov/Spyros Winch re:  PF p ali / no med calendar  Chief Complaint  Patient presents with  . Follow-up    Pt states cough has been some better, but she did have some hemoptysis 1 day ago. She c/o right ear pain and decreased hearing in that ear.    On 02 Not limited by breathing from desired activities  / total blood was < 1 tsp  And nothing purulent   No obvious day to day or daytime variability or assoc   cp or chest tightness, subjective wheeze or overt sinus or hb symptoms. No unusual exp hx or h/o childhood pna/ asthma or knowledge of premature birth.  Sleeping ok without nocturnal  or early am exacerbation  of respiratory  c/o's or need for noct saba. Also denies any obvious fluctuation of symptoms with weather or environmental changes or other aggravating or alleviating factors except as outlined above   Current Medications, Allergies, Complete Past Medical History, Past Surgical History, Family History, and Social History were reviewed in [name] record.  ROS  The following are not active complaints unless bolded sore throat, dysphagia, dental problems, itching, sneezing,  nasal congestion or excess/ purulent secretions, ear ache,   fever, chills, sweats, unintended wt loss, classically pleuritic or exertional cp, hemoptysis,  orthopnea pnd or leg swelling, presyncope, palpitations, abdominal pain, anorexia, nausea, vomiting, diarrhea  or change in bowel or bladder habits, change in stools or urine,  dysuria,hematuria,  rash, arthralgias, visual complaints, headache, numbness, weakness or ataxia or problems with walking or coordination,  change in mood/affect or memory.           Objective:  amb somber wf nad  Wt Readings from Last 3 Encounters:  09/20/14 115 lb (52.164 kg)  08/19/14 116 lb 3.2 oz (52.708 kg)  08/12/14 117 lb (53.071 kg)    Vital signs reviewed   HEENT: nl dentition, turbinates, and orophanx. Nl external ear canals without cough reflex   NECK :  without JVD/Nodes/TM/ nl carotid upstrokes bilaterally   LUNGS: no acc muscle use,   mild coarse insp and exp rhonchi bilaterally    CV:  RRR  no s3 or murmur or increase in P2, no  edema   ABD:  soft and nontender with nl excursion in the supine position. No bruits or organomegaly, bowel sounds nl  MS:  warm without deformities, calf tenderness, cyanosis or clubbing  SKIN: warm and dry without lesions    NEURO:  alert, approp, no deficits     I personally reviewed images and agree with radiology impression as follows:  CXR:  07/14/14 IMPRESSION: Extensive pulmonary fibrotic changes. There is no evidence of acute pneumonia nor pulmonary edema.     Labs ordered/ reviewed:    Lab 09/20/14 1443  HGB 11.6*  HCT 35.1*  WBC 8.3  PLT 282.0         Lab Results  Component Value Date   ESRSEDRATE 67* 09/20/2014   ESRSEDRATE 44* 04/29/2013   ESRSEDRATE 42* 01/05/2013             Assessment:

## 2014-09-20 NOTE — Progress Notes (Signed)
Quick Note:  Spoke with pt and notified of results per Dr. Wert. Pt verbalized understanding and denied any questions.  ______ 

## 2014-09-21 ENCOUNTER — Telehealth: Payer: Self-pay | Admitting: Internal Medicine

## 2014-09-21 NOTE — Telephone Encounter (Signed)
Patient wanted clarification on the dosage of the Chlorpheniramine. Patient notified. Nothing further needed.

## 2014-09-22 ENCOUNTER — Encounter: Payer: Self-pay | Admitting: Internal Medicine

## 2014-09-22 NOTE — Assessment & Plan Note (Addendum)
-  off prednisone since around 09/08/2012  - 10/08/2012 PFTs VC  1.45 s obst and DLCO 56 corrects to 111 - 01/05/2013  PFTs VC  1.44 s obst dlco 62 correccts to 123  - 11/02/2013  PFT's VC  1.29 (59%) dlco 48 corrects to 97%  - ESR 10/08/2012 = 59  - down to 45 11/19/12 and 42 12/291/4 > no change 04/29/13 - 11/02/2013  Walked RA  2 laps @ 185 ft each stopped due to  Legs gave out before breathing, no desats, slow pace   - 02/02/2014   Walked RA x one lap @ 185 stopped due to  desat to 85 % but no sob, slow pace  - 09/20/2014   Walked RA x one lap @ 185 stopped due to  Sob/ slow pace, no desat  No evidence dz progression as would be expected if this is indeed pf p ali  though note esr up so if any worse rec Prednisone 10 mg take  4 each am x 2 days,   2 each am x 2 days,  1 each am x 2 days and stop  And if not improving next step would be HRCT chest   I had an extended discussion with the patient reviewing all relevant studies completed to date and  lasting 15 to 20 minutes of a 25 minute visit    Each maintenance medication was reviewed in detail including most importantly the difference between maintenance and prns and under what circumstances the prns are to be triggered using an action plan format that is not reflected in the computer generated alphabetically organized AVS.    Please see instructions for details which were reviewed in writing and the patient given a copy highlighting the part that I personally wrote and discussed at today's ov.

## 2014-09-22 NOTE — Assessment & Plan Note (Signed)
-   09/05/12 reported amb off 02 with sats > 90 to mailbox and back  - 10/08/2012   Walked RA x 2 laps fast pace @ 185 stopped due to  Legs gave out with sats 94% - 11/19/2012  Walked RA  2 laps @ 185 ft each stopped due to  Weak legs - 04/29/2013  Walked RA  2 laps @ 185 ft each stopped due to fatigue, no sob or desat - 11/02/2013  Walked RA  2 laps @ 185 ft each stopped due to  Legs gave out before breathing, no desats, slow pace - 02/02/2014   Walked RA x one lap @ 185 stopped due to  desat to 85 % but no sob, slow pace  -  09/20/2014   Walked RA x one lap @ 185 stopped due to  Sob/ slow pace, no desat     as of 09/20/2014  rec 02 2lpm at hs and prn with  Exertion

## 2014-09-22 NOTE — Assessment & Plan Note (Addendum)
See walking study 09/20/2014  > since not desaturating must be due to restrictive change > repeat pfts next step

## 2014-09-27 ENCOUNTER — Ambulatory Visit (INDEPENDENT_AMBULATORY_CARE_PROVIDER_SITE_OTHER)
Admission: RE | Admit: 2014-09-27 | Discharge: 2014-09-27 | Disposition: A | Discharge status: Home / Self Care | Payer: Medicare Other | Source: Ambulatory Visit | Attending: Adult Health | Admitting: Adult Health

## 2014-09-27 ENCOUNTER — Ambulatory Visit (INDEPENDENT_AMBULATORY_CARE_PROVIDER_SITE_OTHER): Payer: Medicare Other | Admitting: Adult Health

## 2014-09-27 ENCOUNTER — Encounter: Payer: Self-pay | Admitting: Adult Health

## 2014-09-27 VITALS — BP 132/70 | HR 77 | Temp 98.9°F | Ht 64.0 in | Wt 116.0 lb

## 2014-09-27 DIAGNOSIS — J8410 Pulmonary fibrosis, unspecified: Secondary | ICD-10-CM | POA: Diagnosis not present

## 2014-09-27 DIAGNOSIS — J9611 Chronic respiratory failure with hypoxia: Secondary | ICD-10-CM

## 2014-09-27 DIAGNOSIS — J189 Pneumonia, unspecified organism: Secondary | ICD-10-CM | POA: Diagnosis not present

## 2014-09-27 MED ORDER — LEVOFLOXACIN 500 MG PO TABS: 500.0000 mg | ORAL_TABLET | Freq: Every day | ORAL | Status: DC

## 2014-09-27 NOTE — Patient Instructions (Addendum)
Begin Levaquin  daily for 7 days -take with food.  May try  chlorpheniramine) 4 mg x1-2 at bedtime  available over the counter for drainage , may make you sleepy.  Mucinex DM Twice daily  As needed  Cough/congestion  Warm heat to ribs  Advil As needed   Follow up  In 1 week Dr. Sherene Sires  With chest xray  Please contact office for sooner follow up if symptoms do not improve or worsen or seek emergency care

## 2014-09-28 ENCOUNTER — Encounter (HOSPITAL_COMMUNITY): Payer: Self-pay

## 2014-09-28 ENCOUNTER — Ambulatory Visit (INDEPENDENT_AMBULATORY_CARE_PROVIDER_SITE_OTHER): Payer: Medicare Other | Admitting: Adult Health

## 2014-09-28 ENCOUNTER — Encounter: Payer: Self-pay | Admitting: Adult Health

## 2014-09-28 ENCOUNTER — Other Ambulatory Visit: Payer: Self-pay

## 2014-09-28 ENCOUNTER — Inpatient Hospital Stay (HOSPITAL_COMMUNITY)
Admission: AD | Admit: 2014-09-28 | Discharge: 2014-09-30 | DRG: 194 | Disposition: A | Discharge status: Home Health | Payer: Medicare Other | Source: Ambulatory Visit | Attending: Internal Medicine | Admitting: Internal Medicine

## 2014-09-28 VITALS — BP 130/58 | HR 81 | Temp 98.7°F | Ht 64.0 in | Wt 116.0 lb

## 2014-09-28 DIAGNOSIS — K7460 Unspecified cirrhosis of liver: Secondary | ICD-10-CM | POA: Diagnosis present

## 2014-09-28 DIAGNOSIS — Z820 Family history of epilepsy and other diseases of the nervous system: Secondary | ICD-10-CM

## 2014-09-28 DIAGNOSIS — Z833 Family history of diabetes mellitus: Secondary | ICD-10-CM

## 2014-09-28 DIAGNOSIS — Z79899 Other long term (current) drug therapy: Secondary | ICD-10-CM

## 2014-09-28 DIAGNOSIS — J45909 Unspecified asthma, uncomplicated: Secondary | ICD-10-CM | POA: Diagnosis present

## 2014-09-28 DIAGNOSIS — G43909 Migraine, unspecified, not intractable, without status migrainosus: Secondary | ICD-10-CM | POA: Diagnosis present

## 2014-09-28 DIAGNOSIS — J449 Chronic obstructive pulmonary disease, unspecified: Secondary | ICD-10-CM | POA: Diagnosis present

## 2014-09-28 DIAGNOSIS — R011 Cardiac murmur, unspecified: Secondary | ICD-10-CM | POA: Diagnosis present

## 2014-09-28 DIAGNOSIS — Z8249 Family history of ischemic heart disease and other diseases of the circulatory system: Secondary | ICD-10-CM

## 2014-09-28 DIAGNOSIS — Z823 Family history of stroke: Secondary | ICD-10-CM | POA: Diagnosis not present

## 2014-09-28 DIAGNOSIS — J9611 Chronic respiratory failure with hypoxia: Secondary | ICD-10-CM | POA: Diagnosis present

## 2014-09-28 DIAGNOSIS — M1990 Unspecified osteoarthritis, unspecified site: Secondary | ICD-10-CM | POA: Diagnosis present

## 2014-09-28 DIAGNOSIS — K219 Gastro-esophageal reflux disease without esophagitis: Secondary | ICD-10-CM | POA: Diagnosis present

## 2014-09-28 DIAGNOSIS — R0600 Dyspnea, unspecified: Secondary | ICD-10-CM

## 2014-09-28 DIAGNOSIS — R05 Cough: Secondary | ICD-10-CM | POA: Diagnosis not present

## 2014-09-28 DIAGNOSIS — E039 Hypothyroidism, unspecified: Secondary | ICD-10-CM | POA: Diagnosis present

## 2014-09-28 DIAGNOSIS — J8410 Pulmonary fibrosis, unspecified: Secondary | ICD-10-CM | POA: Diagnosis present

## 2014-09-28 DIAGNOSIS — Z7952 Long term (current) use of systemic steroids: Secondary | ICD-10-CM

## 2014-09-28 DIAGNOSIS — I248 Other forms of acute ischemic heart disease: Secondary | ICD-10-CM | POA: Diagnosis present

## 2014-09-28 DIAGNOSIS — Z9981 Dependence on supplemental oxygen: Secondary | ICD-10-CM | POA: Diagnosis not present

## 2014-09-28 DIAGNOSIS — E785 Hyperlipidemia, unspecified: Secondary | ICD-10-CM | POA: Diagnosis present

## 2014-09-28 DIAGNOSIS — N179 Acute kidney failure, unspecified: Secondary | ICD-10-CM | POA: Diagnosis present

## 2014-09-28 DIAGNOSIS — I10 Essential (primary) hypertension: Secondary | ICD-10-CM | POA: Diagnosis present

## 2014-09-28 DIAGNOSIS — Z87891 Personal history of nicotine dependence: Secondary | ICD-10-CM

## 2014-09-28 DIAGNOSIS — J189 Pneumonia, unspecified organism: Secondary | ICD-10-CM

## 2014-09-28 DIAGNOSIS — Z791 Long term (current) use of non-steroidal anti-inflammatories (NSAID): Secondary | ICD-10-CM

## 2014-09-28 DIAGNOSIS — J9610 Chronic respiratory failure, unspecified whether with hypoxia or hypercapnia: Secondary | ICD-10-CM | POA: Diagnosis present

## 2014-09-28 DIAGNOSIS — Z79891 Long term (current) use of opiate analgesic: Secondary | ICD-10-CM

## 2014-09-28 DIAGNOSIS — R778 Other specified abnormalities of plasma proteins: Secondary | ICD-10-CM

## 2014-09-28 DIAGNOSIS — R7989 Other specified abnormal findings of blood chemistry: Secondary | ICD-10-CM

## 2014-09-28 LAB — COMPREHENSIVE METABOLIC PANEL
ALT: 60 U/L — ABNORMAL HIGH (ref 14–54)
AST: 34 U/L (ref 15–41)
Albumin: 3.1 g/dL — ABNORMAL LOW (ref 3.5–5.0)
Alkaline Phosphatase: 116 U/L (ref 38–126)
Anion gap: 10 (ref 5–15)
BUN: 33 mg/dL — ABNORMAL HIGH (ref 6–20)
CHLORIDE: 98 mmol/L — AB (ref 101–111)
CO2: 30 mmol/L (ref 22–32)
Calcium: 8.9 mg/dL (ref 8.9–10.3)
Creatinine, Ser: 1.1 mg/dL — ABNORMAL HIGH (ref 0.44–1.00)
GFR, EST AFRICAN AMERICAN: 52 mL/min — AB (ref >60)
GFR, EST NON AFRICAN AMERICAN: 44 mL/min — AB (ref >60)
Glucose, Bld: 114 mg/dL — ABNORMAL HIGH (ref 65–99)
POTASSIUM: 4.2 mmol/L (ref 3.5–5.1)
Sodium: 138 mmol/L (ref 135–145)
Total Bilirubin: 1 mg/dL (ref 0.3–1.2)
Total Protein: 6.4 g/dL — ABNORMAL LOW (ref 6.5–8.1)

## 2014-09-28 LAB — URINALYSIS, ROUTINE W REFLEX MICROSCOPIC
BILIRUBIN URINE: NEGATIVE
GLUCOSE, UA: NEGATIVE mg/dL
Hgb urine dipstick: NEGATIVE
KETONES UR: NEGATIVE mg/dL
Nitrite: NEGATIVE
PROTEIN: NEGATIVE mg/dL
Specific Gravity, Urine: 1.017 (ref 1.005–1.030)
Urobilinogen, UA: 1 mg/dL (ref 0.0–1.0)
pH: 7.5 (ref 5.0–8.0)

## 2014-09-28 LAB — CBC WITH DIFFERENTIAL/PLATELET
BASOS ABS: 0 10*3/uL (ref 0.0–0.1)
Basophils Relative: 0 %
EOS ABS: 0 10*3/uL (ref 0.0–0.7)
Eosinophils Relative: 0 %
HCT: 32.4 % — ABNORMAL LOW (ref 36.0–46.0)
HEMOGLOBIN: 10.4 g/dL — AB (ref 12.0–15.0)
LYMPHS ABS: 0.9 10*3/uL (ref 0.7–4.0)
LYMPHS PCT: 7 %
MCH: 29.6 pg (ref 26.0–34.0)
MCHC: 32.1 g/dL (ref 30.0–36.0)
MCV: 92.3 fL (ref 78.0–100.0)
Monocytes Absolute: 1.7 10*3/uL — ABNORMAL HIGH (ref 0.1–1.0)
Monocytes Relative: 14 %
NEUTROS PCT: 79 %
Neutro Abs: 10 10*3/uL — ABNORMAL HIGH (ref 1.7–7.7)
Platelets: 227 10*3/uL (ref 150–400)
RBC: 3.51 MIL/uL — AB (ref 3.87–5.11)
RDW: 14.1 % (ref 11.5–15.5)
WBC: 12.6 10*3/uL — AB (ref 4.0–10.5)

## 2014-09-28 LAB — TROPONIN I: TROPONIN I: 0.41 ng/mL — AB (ref <0.031)

## 2014-09-28 LAB — TSH: TSH: 0.097 u[IU]/mL — AB (ref 0.350–4.500)

## 2014-09-28 LAB — URINE MICROSCOPIC-ADD ON

## 2014-09-28 LAB — STREP PNEUMONIAE URINARY ANTIGEN: Strep Pneumo Urinary Antigen: NEGATIVE

## 2014-09-28 LAB — BRAIN NATRIURETIC PEPTIDE: B Natriuretic Peptide: 345.2 pg/mL — ABNORMAL HIGH (ref 0.0–100.0)

## 2014-09-28 MED ORDER — ONDANSETRON HCL 4 MG PO TABS: 4.0000 mg | ORAL_TABLET | Freq: Four times a day (QID) | ORAL | Status: DC | PRN

## 2014-09-28 MED ORDER — IPRATROPIUM BROMIDE 0.06 % NA SOLN
2.0000 | Freq: Four times a day (QID) | NASAL | Status: DC | PRN
  Filled 2014-09-28: qty 15

## 2014-09-28 MED ORDER — LEVOTHYROXINE SODIUM 88 MCG PO TABS
88.0000 ug | ORAL_TABLET | Freq: Every day | ORAL | Status: DC
  Administered 2014-09-29 – 2014-09-30 (×2): 88 ug via ORAL
  Filled 2014-09-28 (×2): qty 1

## 2014-09-28 MED ORDER — ALBUTEROL SULFATE (2.5 MG/3ML) 0.083% IN NEBU: 2.5000 mg | INHALATION_SOLUTION | RESPIRATORY_TRACT | Status: DC | PRN

## 2014-09-28 MED ORDER — ACETAMINOPHEN 650 MG RE SUPP
650.0000 mg | Freq: Four times a day (QID) | RECTAL | Status: DC | PRN
Start: 2014-09-28 — End: 2014-09-30

## 2014-09-28 MED ORDER — LEVOFLOXACIN IN D5W 250 MG/50ML IV SOLN
250.0000 mg | INTRAVENOUS | Status: DC
  Administered 2014-09-29 – 2014-09-30 (×2): 250 mg via INTRAVENOUS
  Filled 2014-09-28 (×2): qty 50

## 2014-09-28 MED ORDER — MORPHINE SULFATE (PF) 2 MG/ML IV SOLN
1.0000 mg | Freq: Once | INTRAVENOUS | Status: AC
  Administered 2014-09-28: 1 mg via INTRAVENOUS
  Filled 2014-09-28: qty 1

## 2014-09-28 MED ORDER — LEVOFLOXACIN IN D5W 500 MG/100ML IV SOLN
500.0000 mg | INTRAVENOUS | Status: DC
  Administered 2014-09-28: 500 mg via INTRAVENOUS
  Filled 2014-09-28: qty 100

## 2014-09-28 MED ORDER — PANTOPRAZOLE SODIUM 40 MG PO TBEC
40.0000 mg | DELAYED_RELEASE_TABLET | Freq: Two times a day (BID) | ORAL | Status: DC
  Administered 2014-09-28 – 2014-09-30 (×4): 40 mg via ORAL
  Filled 2014-09-28 (×6): qty 1

## 2014-09-28 MED ORDER — ACETAMINOPHEN 325 MG PO TABS
650.0000 mg | ORAL_TABLET | Freq: Four times a day (QID) | ORAL | Status: DC | PRN
  Administered 2014-09-29: 650 mg via ORAL
  Filled 2014-09-28: qty 2

## 2014-09-28 MED ORDER — ONDANSETRON HCL 4 MG/2ML IJ SOLN: 4.0000 mg | Freq: Four times a day (QID) | INTRAMUSCULAR | Status: DC | PRN

## 2014-09-28 MED ORDER — NEBIVOLOL HCL 10 MG PO TABS: 10.0000 mg | ORAL_TABLET | Freq: Every day | ORAL | Status: DC

## 2014-09-28 MED ORDER — GUAIFENESIN ER 600 MG PO TB12
600.0000 mg | ORAL_TABLET | Freq: Two times a day (BID) | ORAL | Status: DC
  Administered 2014-09-28 – 2014-09-30 (×5): 600 mg via ORAL
  Filled 2014-09-28 (×6): qty 1

## 2014-09-28 MED ORDER — MAGNESIUM CHLORIDE 64 MG PO TBEC
1.0000 | DELAYED_RELEASE_TABLET | Freq: Every day | ORAL | Status: DC
  Administered 2014-09-29 – 2014-09-30 (×2): 64 mg via ORAL
  Filled 2014-09-28 (×2): qty 1

## 2014-09-28 MED ORDER — HEPARIN SODIUM (PORCINE) 5000 UNIT/ML IJ SOLN
5000.0000 [IU] | Freq: Three times a day (TID) | INTRAMUSCULAR | Status: DC
  Administered 2014-09-28 – 2014-09-30 (×7): 5000 [IU] via SUBCUTANEOUS
  Filled 2014-09-28 (×7): qty 1

## 2014-09-28 MED ORDER — NEBIVOLOL HCL 10 MG PO TABS
10.0000 mg | ORAL_TABLET | Freq: Two times a day (BID) | ORAL | Status: DC
  Administered 2014-09-28 – 2014-09-30 (×4): 10 mg via ORAL
  Filled 2014-09-28 (×5): qty 1

## 2014-09-28 MED ORDER — TRAMADOL HCL 50 MG PO TABS
50.0000 mg | ORAL_TABLET | Freq: Four times a day (QID) | ORAL | Status: DC | PRN
  Administered 2014-09-28: 50 mg via ORAL
  Filled 2014-09-28: qty 1

## 2014-09-28 MED ORDER — SENNOSIDES-DOCUSATE SODIUM 8.6-50 MG PO TABS: 1.0000 | ORAL_TABLET | Freq: Every evening | ORAL | Status: DC | PRN

## 2014-09-28 MED ORDER — AMLODIPINE BESYLATE 2.5 MG PO TABS
2.5000 mg | ORAL_TABLET | Freq: Every morning | ORAL | Status: DC
  Administered 2014-09-29 – 2014-09-30 (×2): 2.5 mg via ORAL
  Filled 2014-09-28 (×2): qty 1

## 2014-09-28 MED ORDER — DEXTROMETHORPHAN POLISTIREX ER 30 MG/5ML PO SUER
15.0000 mg | Freq: Two times a day (BID) | ORAL | Status: DC | PRN
  Filled 2014-09-28: qty 5

## 2014-09-28 NOTE — H&P (Signed)
Name: [name] MRN: [id number] DOB: [birth year]    ADMISSION DATE:   09/28/14      CHIEF COMPLAINT:  PNA , no better   BRIEF PATIENT DESCRIPTION:  4 yowf never sign smoker with history of recurrent PNA and Pulmonary fibrosis on chronic O2 at home at 2l/m . Seen in office with recurrent "bronchitis " since July 2016  treated with abx . No significant improvement with worsening 09/27/14 , cxr with right mid lung infiltrate. Started on Levaquin however no better and admitted from office on 09/28/14     SIGNIFICANT EVENTS  Admit from office with OP failure of abx 09/28/14   STUDIES:  CTA chest 09/28/14 >>   HISTORY OF PRESENT ILLNESS:   Pt returns for persistent cough, right sided pleuritic pain and dyspnea.  Pt was seen yesterday in office 09/27/14 with persistent dyspnea, increased cough and new onset right sided pleuritic rib pain.CXR showed right mid lung infiltrate c/w PNA . Did also have ant L pleuritic cp this "moved to her R side" and resolved on L.  She was started on levaquin x 7days .  Says she does not feel any better since starting antibiotics and is weak.  Has congested cough with thick white mucus.  Was seen 1 week ago with ?PF flare , tx w/ steroid taper. ESR was elevated at 67. WBC was nml.  She has been having recurrent increased cough and congestion for last 2 months. Seen in July tx w/ Zpack . Returned in August and tx w/ Augmentin for bronchitis . Says she gets a little better but symptoms never resolved completely and are worse for last 3 days. She did cough of blood tinged mucus in August that was self limiting . No further blood since that time.  Remains on O2 at 2l/m with sats 96% in office today.  Appetite is fair with no n/v/d.  Denies fever, hemoptysis, exertional chest pain, syncope or calf pain. No increased leg swelling or h/o dvt. Marland Kitchen   PAST MEDICAL HISTORY :   has a past medical history of COPD (chronic obstructive pulmonary disease);  Hyperlipemia; Heart murmur; Shortness of breath; Asthma; Pneumonia; Headache(784.0); GERD (gastroesophageal reflux disease); Arthritis; Diverticulitis (2008); Pulmonary fibrosis; Hypertension; Cirrhosis of liver not due to alcohol; Hypothyroidism; and On home O2.  has past surgical history that includes Eye surgery (05/2008); Back surgery; Total knee arthroplasty (11/2008); Rotator cuff repair (2008); Retinal tear repair cryotherapy; Tonsillectomy; Appendectomy; Dilation and curettage of uterus; Total knee arthroplasty (07/03/2011); transthoracic echocardiogram (04/12/2011); and Varicose vein surgery. Prior to Admission medications   Medication Sig Start Date End Date Taking? Authorizing Provider  albuterol (PROVENTIL HFA;VENTOLIN HFA) 108 (90 BASE) MCG/ACT inhaler Inhale 2 puffs into the lungs every 4 (four) hours as needed for wheezing or shortness of breath. For shortness of breath    Historical Provider, MD  albuterol (PROVENTIL) (5 MG/ML) 0.5% nebulizer solution Take 0.5 mL (2.5 mg total) by nebulization every 6 (six) hours as needed for wheezing. Patient taking differently: Take 2.5 mg by nebulization every 4 (four) hours as needed for wheezing or shortness of breath.  08/25/12   Bobby Rumpf York, PA-C  amLODipine (NORVASC) 2.5 MG tablet Take 2.5 mg by mouth every morning.     Historical Provider, MD  BYSTOLIC 10 MG tablet TAKE ONE TABLET BY MOUTH ONE TIME DAILY Patient taking differently: TAKE ONE TABLET BY MOUTH EVERY MORNING 07/08/14   Pixie Casino, MD  calcium carbonate (OS-CAL) 600 MG TABS  tablet Take 600 mg by mouth daily with lunch.     Historical Provider, MD  dextromethorphan (DELSYM) 30 MG/5ML liquid Take 2 tsp twice a day as needed for cough    Historical Provider, MD  diphenhydramine-acetaminophen (TYLENOL PM) 25-500 MG TABS Take 1 tablet by mouth at bedtime as needed.     Historical Provider, MD  Glucosamine-Chondroitin (GLUCOSAMINE CHONDR COMPLEX PO) Take 1 tablet by mouth daily at 12  noon.     Historical Provider, MD  hydrochlorothiazide (HYDRODIURIL) 25 MG tablet Take 25 mg by mouth every morning.    Historical Provider, MD  ipratropium (ATROVENT) 0.06 % nasal spray Place 2 sprays into both nostrils 4 (four) times daily. Patient taking differently: Place 2 sprays into both nostrils 4 (four) times daily as needed.  11/02/13   Tanda Rockers, MD  irbesartan (AVAPRO) 300 MG tablet TAKE ONE TABLET BY MOUTH ONE TIME DAILY Patient taking differently: TAKE ONE TABLET BY MOUTH EVERY MORNING 07/02/14   Pixie Casino, MD  levofloxacin (LEVAQUIN) 500 MG tablet Take 1 tablet (500 mg total) by mouth daily. 09/27/14   Tammy S Parrett, NP  levothyroxine (SYNTHROID, LEVOTHROID) 88 MCG tablet Take 88 mcg by mouth daily before breakfast.    Historical Provider, MD  Magnesium 250 MG TABS Take 1 tablet by mouth daily at 12 noon.    Historical Provider, MD  methocarbamol (ROBAXIN) 500 MG tablet Take 500 mg by mouth 2 (two) times daily as needed for muscle spasms.    Historical Provider, MD  naproxen sodium (ANAPROX) 220 MG tablet Per bottle as needed for joint pain    Historical Provider, MD  OXYGEN 2 l/m 24/7    Historical Provider, MD  pantoprazole (PROTONIX) 40 MG tablet Take 1 tablet (40 mg total) by mouth 2 (two) times daily. Patient taking differently: Take 40 mg by mouth 2 (two) times daily before a meal.  08/20/13   Ripudeep Krystal Eaton, MD  Polyethyl Glycol-Propyl Glycol (SYSTANE OP) Place 1 drop into both eyes every morning.     Historical Provider, MD  predniSONE (DELTASONE) 10 MG tablet Take  4 each am x 2 days,   2 each am x 2 days,  1 each am x 2 days and stop 09/20/14   Tanda Rockers, MD  traMADol (ULTRAM) 50 MG tablet One every 4 hours for pain or cough Patient taking differently: Take 50 mg by mouth every 6 (six) hours as needed.  07/14/14   Tanda Rockers, MD   No Known Allergies  FAMILY HISTORY:  family history includes Diabetes in her sister; Heart attack in her brother; Heart  disease in her sister; Hyperlipidemia in her sister; Hypertension in her sister; Multiple sclerosis in her sister; Stroke in her maternal grandmother and sister. SOCIAL HISTORY:  reports that she quit smoking about 67 years ago. She has never used smokeless tobacco. She reports that she does not drink alcohol or use illicit drugs.  REVIEW OF SYSTEMS:   Constitutional:   No  weight loss, night sweats,  Fevers, chills +, fatigue, or  lassitude.  HEENT:   No headaches,  Difficulty swallowing,  Tooth/dental problems, or  Sore throat,                No sneezing, itching, ear ache, nasal congestion, post nasal drip,   CV:  No chest pain,  Orthopnea, PND, swelling in lower extremities, anasarca, dizziness, palpitations, syncope.   GI  No heartburn, indigestion, abdominal pain, nausea, vomiting,  diarrhea, change in bowel habits, loss of appetite, bloody stools.   Resp:    No chest wall deformity  Skin: no rash or lesions.  GU: no dysuria, change in color of urine, no urgency or frequency.  No flank pain, no hematuria   MS:  Right rib pain   Psych:  No change in mood or affect. No depression or anxiety.  No memory loss.      SUBJECTIVE:  Still sob , very weak   VITAL SIGNS: Temp:  [98.7 F (37.1 C)-98.9 F (37.2 C)] 98.7 F (37.1 C) (09/20 1120) Pulse Rate:  [77-81] 81 (09/20 1120) BP: (130-132)/(58-70) 130/58 mmHg (09/20 1120) SpO2:  [96 %-97 %] 96 % (09/20 1120) Weight:  [116 lb (52.617 kg)] 116 lb (52.617 kg) (09/20 1120)  PHYSICAL EXAMINATION: amb somber wf nad on O2 ,  In wheelchair   Vital signs reviewed   HEENT: nl dentition, turbinates, and orophanx. Nl external ear canals without cough reflex   NECK : without JVD/Nodes/TM/ nl carotid upstrokes bilaterally   LUNGS: no acc muscle use, mild coarse insp and exp rhonchi bilaterally,  Ribs with no deformitites noted , mild tenderness to palpation, no rash    CV: RRR no s3 or murmur or increase in P2, tr  edema   ABD: soft and nontender with nl excursion in the supine position. No bruits or organomegaly, bowel sounds nl  MS: warm without deformities, calf tenderness, cyanosis or clubbing  SKIN: warm and dry without lesions   NEURO: alert, approp, no deficits  No results for input(s): NA, K, CL, CO2, BUN, CREATININE, GLUCOSE in the last 168 hours. No results for input(s): HGB, HCT, WBC, PLT in the last 168 hours. Dg Chest 2 View  09/27/2014   CLINICAL DATA:  Right-sided chest pain, history of COPD and pulmonary fibrosis  EXAM: CHEST - 2 VIEW  COMPARISON:  07/14/2014  FINDINGS: Cardiac shadow is stable. Diffuse fibrotic changes are again identified throughout both lungs of a chronic nature. There is some slight increased density identified in the mid right lung which is new from the prior exam and likely represents an acute infiltrate. No sizable effusion is seen. Degenerative scoliosis is again seen.  IMPRESSION: Chronic fibrotic changes.  Changes consistent with acute infiltrate in the right mid lung. Followup PA and lateral chest X-ray is recommended in 3-4 weeks following trial of antibiotic therapy to ensure resolution and exclude underlying malignancy.   Electronically Signed   By: Inez Catalina M.D.   On: 09/27/2014 16:19    ASSESSMENT / PLAN: 1. CAP with right sided pleuritic pain -OP failure requiring admission to hospital for further tx and evaluation .   Plan  Begin Levaquin IV  Mucinex and delsym Twice daily As needed   Albuterol Neb As needed   Check CTA chest -as right sided pleuritic pain and initial hemoptysis (in July/aug in setting of some epistaxis)  Check EKG and troponin , BNP  If BNP is elevated consider echo  Check sputum cx  Check legionella urine ag and strep pneum ag   2. Pulmonary Fibrosis  Recent flare with elevated ESR , tx w/ steroids 1 week ago  Plan  CT chest pending .  Hold on additional steroids for now .  Cont GERD tx.   3. Chronic Hypoxic Resp  Failure  Compensated on O2   Plan  Cont O2 to keep sats >90%.    4. HTN  Controlled on rx   Plan :  Cont home meds  With bystolic and norvasc  Hold Avapro and HCTZ for now as contrasted CT pending (potential nephrotoxin) -restart as indicated.   5. Hypothyroid    Plan  Cont synthroid  Check tsh     Pt seen and examined independently and discussed with Rexene Edison, NP and above not edited and orders reviewed and approved.   Christinia Gully, MD Pulmonary and [place] [phone] After 5:30 PM or weekends, call [phone]

## 2014-09-28 NOTE — Progress Notes (Signed)
EKG results called to Rubye Oaks, NP.

## 2014-09-28 NOTE — Assessment & Plan Note (Signed)
O2 sats good on 2l/m  Cont w/ O2 .

## 2014-09-28 NOTE — Assessment & Plan Note (Signed)
Right sided CAP with pleuritic pain  CXR shows a right sided mid infiltrate c/w CAP  Pt has hx of recurrent PNA ,  O2 sats are good on her chronic O2 . She has good appetite w/ no n/v/d.  Will tx w/ OP abx , discussed with pt close short term follow up if not improving will need admission for IV abx. And tx.   Plan  Begin Levaquin  daily for 7 days -take with food.  May try  chlorpheniramine) 4 mg x1-2 at bedtime  available over the counter for drainage , may make you sleepy.  Mucinex DM Twice daily  As needed  Cough/congestion  Warm heat to ribs  Advil As needed   Follow up  In 1 week Dr. Sherene Sires  With chest xray  Please contact office for sooner follow up if symptoms do not improve or worsen or seek emergency care

## 2014-09-28 NOTE — Progress Notes (Signed)
Pulmonary attending:  ekg  reviewed and agree no acute change and although initial trop in neg her f/u is pos at 0.41.  Her pain was clearly R sided and pleuritic so not an anginal equivalent but I suspect she has mild demand ischemia  rec increase bystolic to 10 mg bid and maintain adequate sats  Cards eval if continues to rise or she develops any angina/diaph/ nausea    Sandrea Hughs, MD Pulmonary and Critical Care Medicine [facility] Cell [phone] After 5:30 PM or weekends, call [phone]

## 2014-09-28 NOTE — Progress Notes (Signed)
Chart and office note reviewed in detail along with available xrays/ labs > agree with a/p as outlined including levaquin and f/u in one week for apparent cap in pt on prednisone

## 2014-09-28 NOTE — Patient Instructions (Signed)
Admit to hospital 

## 2014-09-28 NOTE — Progress Notes (Signed)
Subjective:     Patient ID: [name], female   DOB: 06/03/1927   MRN: [id number]     Primary is [name] with [name]    Brief patient profile:  32 yowf never sign smoker followed by [name] remotely with h/o recurrent pna as infant and extending into adulthood as "freq bronchitis" then middle age seemed less freq, less severe and then wose x 2011/12 but never on 02 24/7 until p hosp for an illness that started in May 2014 and resulted in hosp at [facility] with the following d/c summary :    Admit date: 08/23/2012  Discharge date: 08/25/2012  Discharge Diagnoses:  CAP (community acquired pneumonia)  COPD (chronic obstructive pulmonary disease)  Chronic respiratory failure  GERD (gastroesophageal reflux disease)  Cirrhosis         04/29/2013 f/u ov/Wert re: PF Chief Complaint  Patient presents with  . Follow-up    hurts to take a deep breathe, c/o increase SOB, wheezing and chest tx constantly, prod cough foamy phlem.    Nl using 02 2lpm with grocery (not always), no longer interested in walking to road (prev "measuring stick" for activity tolerance)  due to bad knee  Rarely uses neb now due to fear of fast heart rate  gen ant chest discomfort with cough/ better when she does use saba rec Please remember to go to the lab and x-ray department downstairs for your tests - we will call you with the results when they are available. bystolic 5 mg daily  Try tramadol 134m every 4 hour as needed for cough or pain For nasal drainage/ throat tickle  take chlortrimeton (chlorpheniramine) 4 mg every 4 hours available over the counter (may cause drowsiness)  Please schedule a follow up office visit in 4-6 weeks, call sooner if needed pfts bring all medications including over the counters (call in meantime if they don't match up completely)   06/02/2013 f/u ov/Wert re: PF  Chief Complaint  Patient presents with  . Follow-up    Pt reports her breathing is unchanged since her last visit. Chest  discomfort also no better. Her cough is some better, but still c/o wheezing.    nose dripping x 6 months, only during the day , watery. "wheezing" some better p saba but rarely uses Has nose drops > not better ? NS rec Stop chlortrimeton  Try dymista one twice daily each nostil > no better so changed to atrovent ns and improved  Continue prilosec 20 mg Take 30- 60 min before your first and last meals of the day  Only use your albuterol as a rescue medication   Return 10/08/13 for pfts/cxr - call sooner if feel breathing or coughing are worse - bring all medications including over the counter     02/02/2014 f/u ov/Wert re:  No meds/ f/u PF p apparent ali  Chief Complaint  Patient presents with  . Follow-up    Pt states that her breathing is unchanged. No new co's today.   Continues on 02 2lpm at hs and prn daytime - very sedentary and really Not limited by breathing from desired activities  / min use of saba rec Med calendar   08/12/14 med calendar ov    07/14/2014  Acute  ov/Wert re:  No meds/ no med caledar  Chief Complaint  Patient presents with  . Follow-up    Pt c/o increased SOB, wheezing, prod cough with white mucus x 1 month. Fever at bedtime.  Breathing worse x one month, then suddenly worse x 2 days ago but still comfortable at rest   No obvious daytime variabilty or assoc cp or chest tightness,  overt sinus or hb symptoms. No unusual exp hx or h/o childhood asthma or knowledge of premature birth.   Sleeping ok without nocturnal  or early am exacerbation  of respiratory  c/o's or need for noct saba. Also denies any obvious fluctuation of symptoms with weather or environmental changes or other aggravating or alleviating factors except as outlined above   08/19/14- 87 yoF former smoker followed by Dr Melvyn Novas for chronic bronchitis, hx pneumonias O2 2L sleep and exertion/ APS   son is here ACUTE VISIT: MW patient;Pt states she was coughing today and coughed up blood tinged  sputum-only had this happen once before. Always has SOB and wheezing as well. Shows brown clot size of pencil eraser in thick clear mucus on Kleenex. Treated with Z-Pak at last visit. Small lung clot in otherwise clear/yellow sputum today.   rec Script sent for augmentin antibiotic Script sent for prednisone for 4 days Ok to use the tramadol and Delsym for cough as needed Please make an appointment to see Dr Melvyn Novas again for recheck in next month or so.   . 09/20/2014 f/u ov/Wert re:  PF p ali / no med calendar  Chief Complaint  Patient presents with  . Follow-up    Pt states cough has been some better, but she did have some hemoptysis 1 day ago. She c/o right ear pain and decreased hearing in that ear.    On 02 Not limited by breathing from desired activities  / total blood was < 1 tsp  And nothing purulent  >pred taper   09/27/14 Acute OV : Recurrent PNA /Pulmonary Fibrosis  Pt presents for an acute office visit . Seen one ago with persistent cough . She was treated with prednisone taper .  She does not feel much better and now has right sided rib pain when she coughs or takes in deep breath. Some soreness to touch on the ribs on right.  Labs last week showed ESR at 67 . Nml WBC and stable hbg. Cough is increased with thick white/cream mucus.  Denies fever, hemoptysis, exertional chest pain, syncope or calf pain. No increased leg swelling .  Appetite is fair with no n/v/d.  Marland Kitchen Completed pred taper this morning. She remains on 2l/m O2 with sats 97% today in office  CXR today shows right mid lung infiltrate.    Current Medications, Allergies, Complete Past Medical History, Past Surgical History, Family History, and Social History were reviewed in [employer] record.  ROS  The following are not active complaints unless bolded sore throat, dysphagia, dental problems, itching, sneezing,  nasal congestion or excess/ purulent secretions, ear ache,   fever, chills,  sweats, unintended wt loss, classically pleuritic or exertional cp,  orthopnea pnd or leg swelling, presyncope, palpitations, abdominal pain, anorexia, nausea, vomiting, diarrhea  or change in bowel or bladder habits, change in stools or urine, dysuria,hematuria,  rash, arthralgias, visual complaints, headache, numbness, weakness or ataxia or problems with walking or coordination,  change in mood/affect or memory.           Objective:  amb somber wf nad on O2 , walks with cane    Vital signs reviewed   HEENT: nl dentition, turbinates, and orophanx. Nl external ear canals without cough reflex   NECK :  without JVD/Nodes/TM/ nl carotid  upstrokes bilaterally   LUNGS: no acc muscle use,   mild coarse insp and exp rhonchi bilaterally,  Ribs with no deformitites noted , mild tenderness to palpation, no rash    CV:  RRR  no s3 or murmur or increase in P2, tr edema   ABD:  soft and nontender with nl excursion in the supine position. No bruits or organomegaly, bowel sounds nl  MS:  warm without deformities, calf tenderness, cyanosis or clubbing  SKIN: warm and dry without lesions    NEURO:  alert, approp, no deficits      CXR:  07/14/14 IMPRESSION: Extensive pulmonary fibrotic changes. There is no evidence of acute pneumonia nor pulmonary edema.     Labs ordered/ reviewed:    Lab 09/20/14 1443  HGB 11.6*  HCT 35.1*  WBC 8.3  PLT 282.0         Lab Results  Component Value Date   ESRSEDRATE 67* 09/20/2014   ESRSEDRATE 44* 04/29/2013   ESRSEDRATE 42* 01/05/2013             Assessment:

## 2014-09-28 NOTE — Assessment & Plan Note (Signed)
Admit to hospital , see H/P

## 2014-09-28 NOTE — Progress Notes (Addendum)
Called to bedside to assess patient with reported abnormal EKG.   EKG reviewed and compared to prior in 05/2014.  No significant change in EKG from prior exam.  Reviewed with attending MD.  Patient in no acute distress. Complains of right sided pleuritic chest pain.  Plan: Assess troponin trend Repeat EKG at 1600 Await CTA chest   Canary Brim, NP-C [facility] Pgr: [phone] or if no answer [phone] 09/28/2014, 1:52 PM

## 2014-09-28 NOTE — Progress Notes (Signed)
Subjective:     Patient ID: [name], female   DOB: 06/03/1927   MRN: [id number]     Primary is [name] with [name]    Brief patient profile:  32 yowf never sign smoker followed by [name] remotely with h/o recurrent pna as infant and extending into adulthood as "freq bronchitis" then middle age seemed less freq, less severe and then wose x 2011/12 but never on 02 24/7 until p hosp for an illness that started in May 2014 and resulted in hosp at [facility] with the following d/c summary :    Admit date: 08/23/2012  Discharge date: 08/25/2012  Discharge Diagnoses:  CAP (community acquired pneumonia)  COPD (chronic obstructive pulmonary disease)  Chronic respiratory failure  GERD (gastroesophageal reflux disease)  Cirrhosis         04/29/2013 f/u ov/[name] re: PF Chief Complaint  Patient presents with  . Follow-up    hurts to take a deep breathe, c/o increase SOB, wheezing and chest tx constantly, prod cough foamy phlem.    Nl using 02 2lpm with grocery (not always), no longer interested in walking to road (prev "measuring stick" for activity tolerance)  due to bad knee  Rarely uses neb now due to fear of fast heart rate  gen ant chest discomfort with cough/ better when she does use saba rec Please remember to go to the lab and x-ray department downstairs for your tests - we will call you with the results when they are available. bystolic 5 mg daily  Try tramadol 134m every 4 hour as needed for cough or pain For nasal drainage/ throat tickle  take chlortrimeton (chlorpheniramine) 4 mg every 4 hours available over the counter (may cause drowsiness)  Please schedule a follow up office visit in 4-6 weeks, call sooner if needed pfts bring all medications including over the counters (call in meantime if they don't match up completely)   06/02/2013 f/u ov/[name] re: PF  Chief Complaint  Patient presents with  . Follow-up    Pt reports her breathing is unchanged since her last visit. Chest  discomfort also no better. Her cough is some better, but still c/o wheezing.    nose dripping x 6 months, only during the day , watery. "wheezing" some better p saba but rarely uses Has nose drops > not better ? NS rec Stop chlortrimeton  Try dymista one twice daily each nostil > no better so changed to atrovent ns and improved  Continue prilosec 20 mg Take 30- 60 min before your first and last meals of the day  Only use your albuterol as a rescue medication   Return 10/08/13 for pfts/cxr - call sooner if feel breathing or coughing are worse - bring all medications including over the counter     02/02/2014 f/u ov/[name] re:  No meds/ f/u PF p apparent ali  Chief Complaint  Patient presents with  . Follow-up    Pt states that her breathing is unchanged. No new co's today.   Continues on 02 2lpm at hs and prn daytime - very sedentary and really Not limited by breathing from desired activities  / min use of saba rec Med calendar   08/12/14 med calendar ov    07/14/2014  Acute  ov/[name] re:  No meds/ no med caledar  Chief Complaint  Patient presents with  . Follow-up    Pt c/o increased SOB, wheezing, prod cough with white mucus x 1 month. Fever at bedtime.  Breathing worse x one month, then suddenly worse x 2 days ago but still comfortable at rest  >Zpack    08/19/14- 87 yoF former smoker followed by [name] for chronic bronchitis, hx pneumonias O2 2L sleep and exertion/ [name]   son is here ACUTE VISIT: [name] patient;Pt states she was coughing today and coughed up blood tinged sputum-only had this happen once before. Always has SOB and wheezing as well. Shows brown clot size of pencil eraser in thick clear mucus on Kleenex. Treated with Z-Pak at last visit. Small lung clot in otherwise clear/yellow sputum today.   rec Script sent for augmentin antibiotic Script sent for prednisone for 4 days Ok to use the tramadol and Delsym for cough as needed Please make an appointment to see [name]  again for recheck in next month or so.   . 09/20/2014 f/u ov/[name] re:  PF p ali / no med calendar  Chief Complaint  Patient presents with  . Follow-up    Pt states cough has been some better, but she did have some hemoptysis 1 day ago. She c/o right ear pain and decreased hearing in that ear.    On 02 Not limited by breathing from desired activities  / total blood was < 1 tsp  And nothing purulent  >pred taper   09/27/14 Acute OV : Recurrent PNA /Pulmonary Fibrosis  Pt presents for an acute office visit . Seen one ago with persistent cough . She was treated with prednisone taper .  She does not feel much better and now has right sided rib pain when she coughs or takes in deep breath. Some soreness to touch on the ribs on right.  Labs last week showed ESR at 67 . Nml WBC and stable hbg. Cough is increased with thick white/cream mucus.  Denies fever, hemoptysis, exertional chest pain, syncope or calf pain. No increased leg swelling .  Appetite is fair with no n/v/d.  Marland Kitchen Completed pred taper this morning. She remains on 2l/m O2 with sats 97% today in office  CXR today shows right mid lung infiltrate.  >>tx w/ levaquin x 7 d   09/28/2014 Acute OV : CAP and Pulmonary Fibrosis  Pt returns for persistent cough, right sided pleuritic pain and dyspnea.  Pt was seen yesterday in office with persistent dyspnea, increased cough and new onset right sided pleuritic rib pain.CXR showed right mid lung infiltrate c/w PNA . She was started on levaquin x 7days .   Says she does not feel any better since starting antibiotics and is weak.  Has congested cough with thick white mucus.  Was seen 1 week ago with ?PF flare , tx w/ steroid taper. ESR was elevated at 67. WBC was nml.  She has been having recurrent increased cough and congestion for last 2 months. Seen in July tx w/ Zpack . Returned in August and tx w/ Augmentin for bronchitis . Says she gets a little better but symptoms never resolved completely and  are worse for last 3 days. She did cough of blood tinged mucus in August that was self limiting . No further blood since that time.  Remains on O2 at 2l/m with sats 96% in office today.  Appetite is fair with no n/v/d.  Denies fever, hemoptysis, exertional chest pain, syncope or calf pain. No increased leg swelling .   Current Medications, Allergies, Complete Past Medical History, Past Surgical History, Family History, and Social History were reviewed in [employer] record.  ROS  The following are not active complaints unless bolded sore throat, dysphagia, dental problems, itching, sneezing,  nasal congestion or excess/ purulent secretions, ear ache,   fever, chills, sweats, unintended wt loss, classically pleuritic or exertional cp,  orthopnea pnd or leg swelling, presyncope, palpitations, abdominal pain, anorexia, nausea, vomiting, diarrhea  or change in bowel or bladder habits, change in stools or urine, dysuria,hematuria,  rash, arthralgias, visual complaints, headache, numbness, weakness or ataxia or problems with walking or coordination,  change in mood/affect or memory.           Objective:  amb somber wf nad on O2 , walks with cane    Vital signs reviewed   HEENT: nl dentition, turbinates, and orophanx. Nl external ear canals without cough reflex   NECK :  without JVD/Nodes/TM/ nl carotid upstrokes bilaterally   LUNGS: no acc muscle use,   mild coarse insp and exp rhonchi bilaterally,  Ribs with no deformitites noted , mild tenderness to palpation, no rash    CV:  RRR  no s3 or murmur or increase in P2, tr edema   ABD:  soft and nontender with nl excursion in the supine position. No bruits or organomegaly, bowel sounds nl  MS:  warm without deformities, calf tenderness, cyanosis or clubbing  SKIN: warm and dry without lesions    NEURO:  alert, approp, no deficits      CXR:  07/14/14 IMPRESSION: Extensive pulmonary fibrotic changes. There is no  evidence of acute pneumonia nor pulmonary edema.    09/27/14 reviewed independently  Chronic fibrotic changes. Changes consistent with acute infiltrate in the right mid lung.   Labs ordered/ reviewed:    Lab 09/20/14 1443  HGB 11.6*  HCT 35.1*  WBC 8.3  PLT 282.0         Lab Results  Component Value Date   ESRSEDRATE 67* 09/20/2014   ESRSEDRATE 44* 04/29/2013   ESRSEDRATE 42* 01/05/2013             Assessment:

## 2014-09-29 ENCOUNTER — Inpatient Hospital Stay (HOSPITAL_COMMUNITY): Payer: Medicare Other

## 2014-09-29 DIAGNOSIS — I248 Other forms of acute ischemic heart disease: Secondary | ICD-10-CM

## 2014-09-29 DIAGNOSIS — J8410 Pulmonary fibrosis, unspecified: Secondary | ICD-10-CM | POA: Diagnosis not present

## 2014-09-29 DIAGNOSIS — R7989 Other specified abnormal findings of blood chemistry: Secondary | ICD-10-CM | POA: Diagnosis not present

## 2014-09-29 DIAGNOSIS — J189 Pneumonia, unspecified organism: Secondary | ICD-10-CM | POA: Diagnosis not present

## 2014-09-29 DIAGNOSIS — R778 Other specified abnormalities of plasma proteins: Secondary | ICD-10-CM

## 2014-09-29 LAB — BASIC METABOLIC PANEL
Anion gap: 6 (ref 5–15)
BUN: 33 mg/dL — AB (ref 6–20)
CHLORIDE: 99 mmol/L — AB (ref 101–111)
CO2: 30 mmol/L (ref 22–32)
Calcium: 8.4 mg/dL — ABNORMAL LOW (ref 8.9–10.3)
Creatinine, Ser: 1.43 mg/dL — ABNORMAL HIGH (ref 0.44–1.00)
GFR calc Af Amer: 38 mL/min — ABNORMAL LOW (ref >60)
GFR, EST NON AFRICAN AMERICAN: 32 mL/min — AB (ref >60)
GLUCOSE: 99 mg/dL (ref 65–99)
POTASSIUM: 3.8 mmol/L (ref 3.5–5.1)
Sodium: 135 mmol/L (ref 135–145)

## 2014-09-29 LAB — CBC
HEMATOCRIT: 33.7 % — AB (ref 36.0–46.0)
Hemoglobin: 10.7 g/dL — ABNORMAL LOW (ref 12.0–15.0)
MCH: 29.3 pg (ref 26.0–34.0)
MCHC: 31.8 g/dL (ref 30.0–36.0)
MCV: 92.3 fL (ref 78.0–100.0)
Platelets: 226 10*3/uL (ref 150–400)
RBC: 3.65 MIL/uL — ABNORMAL LOW (ref 3.87–5.11)
RDW: 13.9 % (ref 11.5–15.5)
WBC: 11 10*3/uL — ABNORMAL HIGH (ref 4.0–10.5)

## 2014-09-29 LAB — TROPONIN I: TROPONIN I: 1.23 ng/mL — AB (ref <0.031)

## 2014-09-29 MED ORDER — LACTATED RINGERS IV SOLN
INTRAVENOUS | Status: DC
Start: 2014-09-29 — End: 2014-09-30
  Administered 2014-09-29 – 2014-09-30 (×2): via INTRAVENOUS

## 2014-09-29 MED ORDER — IOHEXOL 350 MG/ML SOLN
100.0000 mL | Freq: Once | INTRAVENOUS | Status: AC | PRN
  Administered 2014-09-29: 80 mL via INTRAVENOUS

## 2014-09-29 MED ORDER — DIPHENHYDRAMINE HCL 25 MG PO CAPS
25.0000 mg | ORAL_CAPSULE | Freq: Once | ORAL | Status: AC
  Administered 2014-09-29: 25 mg via ORAL
  Filled 2014-09-29: qty 1

## 2014-09-29 MED ORDER — METHOCARBAMOL 500 MG PO TABS
500.0000 mg | ORAL_TABLET | Freq: Three times a day (TID) | ORAL | Status: DC | PRN
  Administered 2014-09-29 (×2): 500 mg via ORAL
  Filled 2014-09-29 (×2): qty 1

## 2014-09-29 NOTE — Consult Note (Signed)
CARDIOLOGY CONSULT NOTE   Patient ID: [name] MRN: [id number], DOB/AGE: [birth year]   Admit date: 09/28/2014 Date of Consult: 09/29/2014    Primary Physician: Darrow Bussing, MD Primary Cardiologist: Dr. Rennis Golden Reason for Consult: Chest Pain  HPI: [name] is a 81 y.o. female with past medical history of Hypertension, Hypothyroidism, Dyslipidemia, COPD, Pulmonary fibrosis (on chronic O2 at home) and GERD admitted on 09/28/2014 for CAP after failing outpatient antibiotic treatment.  In addition to her recent dyspnea and productive coughing, she started to experience some left sided pleuritic chest pain on Sunday evening around 1800 following a coughing spell. She reports the pain moved to her right pectoral region the following morning and has been present off and on since. The pain is mostly present when taking deep breaths and she feels as if the pain "takes her breath away" when it is present. She denies any radiating pain, nausea, or vomiting. She is on chronic O2 at home (2L) secondary to her COPD and Pulmonary Fibrosis.   Upon admission she was noted to still have the right-sided pleuritic chest pain. An EKG was obtained which showed no acute changes. Cyclic troponin values have been < 0.03, 0.41, and 1.23.  She was last seen in office by Dr. Rennis Golden in 05/2014 and doing well at that time. Last echocardiogram was in 04/2011 and EF was > 55% at that time and no wall motion abnormalities were present.   Problem List Past Medical History  Diagnosis Date  . COPD (chronic obstructive pulmonary disease)   . Hyperlipemia   . Heart murmur   . Shortness of breath     with exertion  . Asthma   . Pneumonia     hx of frequent episodes of pneumonia  . Headache(784.0)     MIgraine- Visualchanges- flashing lights and cant focus and cant see.  Marland Kitchen GERD (gastroesophageal reflux disease)   . Arthritis   . Diverticulitis 2008  . Pulmonary fibrosis   . Hypertension   . Cirrhosis of  liver not due to alcohol   . Hypothyroidism   . On home O2     2L N/C    Past Surgical History  Procedure Laterality Date  . Eye surgery  05/2008    - bilateral  . Back surgery    . Total knee arthroplasty  11/2008  . Rotator cuff repair  2008    right  . Retinal tear repair cryotherapy    . Tonsillectomy    . Appendectomy    . Dilation and curettage of uterus    . Total knee arthroplasty  07/03/2011    Procedure: TOTAL KNEE ARTHROPLASTY;  Surgeon: Valeria Batman, MD;  Location: [facility] OR;  Service: Orthopedics;  Laterality: Right;  . Transthoracic echocardiogram  04/12/2011    EF>55%; mod concentric LVH; mild-mod TR  . Varicose vein surgery       Allergies No Known Allergies    Inpatient Medications . amLODipine  2.5 mg Oral q morning - 10a  . guaiFENesin  600 mg Oral BID  . heparin  5,000 Units Subcutaneous 3 times per day  . levofloxacin (LEVAQUIN) IV  250 mg Intravenous Q24H  . levothyroxine  88 mcg Oral QAC breakfast  . magnesium chloride  1 tablet Oral Q1200  . nebivolol  10 mg Oral BID  . pantoprazole  40 mg Oral BID AC    Family History Family History  Problem Relation Age of Onset  . Multiple sclerosis Sister   .  Diabetes Sister   . Heart disease Sister   . Heart attack Brother   . Stroke Maternal Grandmother   . Hypertension Sister   . Hyperlipidemia Sister   . Stroke Sister      Social History Social History   Social History  . Marital Status: Widowed    Spouse Name: N/A  . Number of Children: 5  . Years of Education: N/A   Occupational History  . Not on file.   Social History Main Topics  . Smoking status: Former Smoker -- 1.5 years    Quit date: 01/09/1947  . Smokeless tobacco: Never Used     Comment: socially smoed x 1 1/2 years 04/29/13  . Alcohol Use: No  . Drug Use: No  . Sexual Activity: No   Other Topics Concern  . Not on file   Social History Narrative     Review of Systems General:  No chills, fever, night sweats or  weight changes.  Cardiovascular:  No dyspnea on exertion, edema, orthopnea, palpitations, paroxysmal nocturnal dyspnea. Positive for chest pain. Dermatological: No rash, lesions/masses Respiratory: Positive for productive cough and dyspnea Urologic: No hematuria, dysuria Abdominal:   No nausea, vomiting, diarrhea, bright red blood per rectum, melena, or hematemesis Neurologic:  No visual changes, wkns, changes in mental status. All other systems reviewed and are otherwise negative except as noted above.  Physical Exam Blood pressure 141/65, pulse 77, temperature 98.2 F (36.8 C), temperature source Oral, resp. rate 16, height  (1.626 m), weight 116 lb (52.617 kg), SpO2 98 %.  General: Pleasant, elderly Caucasian female in  NAD Psych: Normal affect. Neuro: Alert and oriented X 3. Moves all extremities spontaneously. HEENT: Normal  Neck: Supple without bruits or JVD. Lungs:  Resp regular and unlabored,Rales at bases bilaterally. Heart: RRR no s3, s4, or murmurs. Abdomen: Soft, non-tender, non-distended, BS + x 4.  Extremities: No clubbing or cyanosis. No edema. DP/PT/Radials 2+ and equal bilaterally.  Labs  Recent Labs  09/28/14 1335 09/28/14 1819 09/29/14 0103  TROPONINI <0.03 0.41* 1.23*   Lab Results  Component Value Date   WBC 11.0* 09/29/2014   HGB 10.7* 09/29/2014   HCT 33.7* 09/29/2014   MCV 92.3 09/29/2014   PLT 226 09/29/2014    Recent Labs Lab 09/28/14 1335 09/29/14 0103  NA 138 135  K 4.2 3.8  CL 98* 99*  CO2 30 30  BUN 33* 33*  CREATININE 1.10* 1.43*  CALCIUM 8.9 8.4*  PROT 6.4*  --   BILITOT 1.0  --   ALKPHOS 116  --   ALT 60*  --   AST 34  --   GLUCOSE 114* 99   Radiology/Studies Dg Chest 2 View: 09/27/2014   CLINICAL DATA:  Right-sided chest pain, history of COPD and pulmonary fibrosis  EXAM: CHEST - 2 VIEW  COMPARISON:  07/14/2014  FINDINGS: Cardiac shadow is stable. Diffuse fibrotic changes are again identified throughout both lungs of a  chronic nature. There is some slight increased density identified in the mid right lung which is new from the prior exam and likely represents an acute infiltrate. No sizable effusion is seen. Degenerative scoliosis is again seen.  IMPRESSION: Chronic fibrotic changes.  Changes consistent with acute infiltrate in the right mid lung. Followup PA and lateral chest X-ray is recommended in 3-4 weeks following trial of antibiotic therapy to ensure resolution and exclude underlying malignancy.   Electronically Signed   By: Alcide Clever M.D.   On: 09/27/2014 16:19  Ct Angio Chest Pe W/cm &/or Wo Cm: 09/29/2014   CLINICAL DATA:  81 year old female with productive cough and hemoptysis and congestion.  EXAM: CT ANGIOGRAPHY CHEST WITH CONTRAST  TECHNIQUE: Multidetector CT imaging of the chest was performed using the standard protocol during bolus administration of intravenous contrast. Multiplanar CT image reconstructions and MIPs were obtained to evaluate the vascular anatomy.  CONTRAST:  80mL OMNIPAQUE IOHEXOL 350 MG/ML SOLN  COMPARISON:  Radiograph dated 09/27/2014 and chest CT dated 08/18/2012  FINDINGS: There are emphysematous changes of the lungs. Patchy areas of airspace opacity predominantly in the subpleural regions bilaterally noted some of which may be chronic and on there is likely represent multifocal pneumonia. Clinical correlation and follow-up to resolution recommended. There is a focal area consolidation in the right middle lobe. There are cavitary lesions in the paramediastinal and infrahilar aspect of the right lower lobe, likely infectious in etiology. A cavitary neoplasm is not excluded. Clinical correlation and follow-up recommended. No pleural effusion or pneumothorax. The central airways are patent.  The thoracic aorta appears unremarkable. There are atherosclerotic calcifications of the thoracic aorta. No CT evidence of pulmonary embolism.  Right hilar adenopathy. There is mild soft tissue thickening  of the left infrahilar pericardial. Cardiomegaly. No significant pericardial effusion. Small hiatal hernia. The esophagus is predominantly collapsed.  No axillary adenopathy. The chest wall soft tissues appear unremarkable. There is extensive degenerative changes of the spine. Scoliosis of the thoracic spine. No acute fracture.  There is heterogeneous enhancement of the spleen.  Review of the MIP images confirms the above findings.  IMPRESSION: No CT evidence of pulmonary embolism.  Multifocal bilateral airspace consolidation most compatible with pneumonia. Clinical correlation and follow-up two resolution recommended.  Right infrahilar/paramediastinal cavitary consolidation. Differential includes fungal infection, abscess, TB, or cavitary neoplasm. Correlation with sputum cultures and pulmonary consult is advised. Bronchoscopy may provide better evaluation.   Electronically Signed   By: Elgie Collard M.D.   On: 09/29/2014 01:43    ECG: Sinus rhythm, rate in 70's, frequent PVC's. No acute ST or T wave changes since previous tracing.   ASSESSMENT AND PLAN  1. Chest Pain - pleuritic in quality, worse with coughing and deep breathing. Has been changing positions over the past several days. - troponin values have been < 0.03, 0.41, and 1.23 and her EKG is without acute changes. - will obtain echocardiogram for further evaluation.    2. HTN - BP has been 141/43 - 151/93 in the past 24 hours. - continue Amlodipine and Bystolic - Add Avapro and HCTZ back as appropriate pending all images are complete.  3. Community Acquired Pneumonia - per Pulmonology Team   Signed, Ellsworth Lennox, PA-C 09/29/2014, 11:56 AM Pager: [phone]  Patient examined chart reviewed Discussed care plan with daughter and patient Severe lung disease.  Has had previous pleuritic pain and chest pain with pneumonia Pain free now with Rx of pneumonia.  ECG no acute changes.  Given stability and  Severity of lung  disease favor conservative approach if echo not high risk and normal EF/  Exam remarkable for frail elderly female with poor air movement unable to take Deep breath.  CT with no PE and severe ILD/pneumonia.    Charlton Haws

## 2014-09-29 NOTE — Care Management Note (Signed)
Case Management Note  Patient Details  Name: EMONNIE CANNADY MRN: [id number] Date of Birth: Dec 06, 1927  Subjective/Objective:  81 y/o f admitted w/PNA. Hx: copd, home 02.From home. Recommend PT cons.                 Action/Plan:d/c plan home.   Expected Discharge Date:   (UNKNOWN)               Expected Discharge Plan:  Home w Home Health Services  In-House Referral:     Discharge planning Services  CM Consult  Post Acute Care Choice:    Choice offered to:     DME Arranged:    DME Agency:     HH Arranged:    HH Agency:     Status of Service:  In process, will continue to follow  Medicare Important Message Given:    Date Medicare IM Given:    Medicare IM give by:    Date Additional Medicare IM Given:    Additional Medicare Important Message give by:     If discussed at Long Length of Stay Meetings, dates discussed:    Additional Comments:  Lanier Clam, RN 09/29/2014, 5:19 PM

## 2014-09-29 NOTE — Progress Notes (Signed)
eLink Physician-Brief Progress Note Patient Name: KAILYNN SATTERLY DOB: July 30, 1927 MRN: [id number]   Date of Service  09/29/2014  HPI/Events of Note  Patient requests home Tylenol PM for sleep.   eICU Interventions  Will order Benadryl 25 mg PO at HS tonight X 1.      Intervention Category Minor Interventions: Routine modifications to care plan (e.g. PRN medications for pain, fever)  Haskell Rihn Eugene 09/29/2014, 9:40 PM

## 2014-09-29 NOTE — Progress Notes (Signed)
Name: [name] MRN: [id number] DOB: Jun 01, 1927    ADMISSION DATE:   09/28/14      CHIEF COMPLAINT:  PNA , no better   BRIEF PATIENT DESCRIPTION:  26 yowf never sign smoker with history of recurrent PNA and Pulmonary fibrosis on chronic O2 at home at 2l/m . Seen in office with recurrent "bronchitis " since July 2016  treated with abx . No significant improvement with worsening 09/27/14 , cxr with right mid lung infiltrate. Started on Levaquin however no better and admitted from office on 09/28/14     SIGNIFICANT EVENTS  Admit from office with OP failure of abx 09/28/14   STUDIES:  CTA chest 09/28/14 >> negative for PE. Multifocal bilateral airspace disease  ECHO 9/21>>>  SUBJECTIVE:   Feels a little better. Still w/ pleuritic type CP w/ deep breath. Biggest complaint is right leg cramp   VITAL SIGNS: Temp:  [98.2 F (36.8 C)-99.8 F (37.7 C)] 98.2 F (36.8 C) (09/21 0513) Pulse Rate:  [67-80] 77 (09/21 0513) Resp:  [16] 16 (09/21 0513) BP: (141-151)/(65-93) 141/65 mmHg (09/21 0513) SpO2:  [98 %-99 %] 98 % (09/21 0513) 2 liters   PHYSICAL EXAMINATION: amb somber wf nad on O2 , no distress  HEENT: NCAT LUNGS: no acc muscle use,dry crackles right, decreased left,  CV: RRR no s3 or murmur or increase in P2, tr edema  ABD: soft and nontender with nl excursion in the supine position. No bruits or organomegaly, bowel sounds nl MS: warm without deformities, calf tenderness, cyanosis or clubbing SKIN: warm and dry without lesions  NEURO: alert, approp, no deficits   Recent Labs Lab 09/28/14 1335 09/29/14 0103  NA 138 135  K 4.2 3.8  CL 98* 99*  CO2 30 30  BUN 33* 33*  CREATININE 1.10* 1.43*  GLUCOSE 114* 99    Recent Labs Lab 09/28/14 1335 09/29/14 0103  HGB 10.4* 10.7*  HCT 32.4* 33.7*  WBC 12.6* 11.0*  PLT 227 226    Recent Labs Lab 09/28/14 1335 09/28/14 1819 09/29/14 0103  TROPONINI <0.03 0.41* 1.23*     Dg Chest 2 View  09/27/2014    CLINICAL DATA:  Right-sided chest pain, history of COPD and pulmonary fibrosis  EXAM: CHEST - 2 VIEW  COMPARISON:  07/14/2014  FINDINGS: Cardiac shadow is stable. Diffuse fibrotic changes are again identified throughout both lungs of a chronic nature. There is some slight increased density identified in the mid right lung which is new from the prior exam and likely represents an acute infiltrate. No sizable effusion is seen. Degenerative scoliosis is again seen.  IMPRESSION: Chronic fibrotic changes.  Changes consistent with acute infiltrate in the right mid lung. Followup PA and lateral chest X-ray is recommended in 3-4 weeks following trial of antibiotic therapy to ensure resolution and exclude underlying malignancy.   Electronically Signed   By: Alcide Clever M.D.   On: 09/27/2014 16:19   Ct Angio Chest Pe W/cm &/or Wo Cm  09/29/2014   CLINICAL DATA:  81 year old female with productive cough and hemoptysis and congestion.  EXAM: CT ANGIOGRAPHY CHEST WITH CONTRAST  TECHNIQUE: Multidetector CT imaging of the chest was performed using the standard protocol during bolus administration of intravenous contrast. Multiplanar CT image reconstructions and MIPs were obtained to evaluate the vascular anatomy.  CONTRAST:  80mL OMNIPAQUE IOHEXOL 350 MG/ML SOLN  COMPARISON:  Radiograph dated 09/27/2014 and chest CT dated 08/18/2012  FINDINGS: There are emphysematous changes of the lungs. Patchy areas  of airspace opacity predominantly in the subpleural regions bilaterally noted some of which may be chronic and on there is likely represent multifocal pneumonia. Clinical correlation and follow-up to resolution recommended. There is a focal area consolidation in the right middle lobe. There are cavitary lesions in the paramediastinal and infrahilar aspect of the right lower lobe, likely infectious in etiology. A cavitary neoplasm is not excluded. Clinical correlation and follow-up recommended. No pleural effusion or pneumothorax.  The central airways are patent.  The thoracic aorta appears unremarkable. There are atherosclerotic calcifications of the thoracic aorta. No CT evidence of pulmonary embolism.  Right hilar adenopathy. There is mild soft tissue thickening of the left infrahilar pericardial. Cardiomegaly. No significant pericardial effusion. Small hiatal hernia. The esophagus is predominantly collapsed.  No axillary adenopathy. The chest wall soft tissues appear unremarkable. There is extensive degenerative changes of the spine. Scoliosis of the thoracic spine. No acute fracture.  There is heterogeneous enhancement of the spleen.  Review of the MIP images confirms the above findings.  IMPRESSION: No CT evidence of pulmonary embolism.  Multifocal bilateral airspace consolidation most compatible with pneumonia. Clinical correlation and follow-up two resolution recommended.  Right infrahilar/paramediastinal cavitary consolidation. Differential includes fungal infection, abscess, TB, or cavitary neoplasm. Correlation with sputum cultures and pulmonary consult is advised. Bronchoscopy may provide better evaluation.   Electronically Signed   By: Elgie Collard M.D.   On: 09/29/2014 01:43    ASSESSMENT / PLAN: CAP with right sided pleuritic pain; superimposed on underlying chronic resp failure due to  IPF (recent flare w/ elevated Sed rate treated 1 week prior to admit w steroids) U strep neg Clinically feels a little better.  Plan  Cont Levaquin IV  Mucinex and delsym Twice daily As needed   Albuterol Neb As needed    F/u legionella urine ag Hold on additional steroids for now .  Cont GERD tx.  Cont O2 to keep sats >90%.   Elevated Troponin Possible demand ischemia. Cards following now Plan Await echo  HTN  Controlled on rx  Plan :  Cont home meds  With bystolic and norvasc  Hold Avapro and HCTZ for now as contrasted CT pending (potential nephrotoxin) -restart as indicated.   Mild AKI-->did get dye  load Plan Add gentle hydration  Hypothyroid   Plan  Cont synthroid      Simonne Martinet ACNP-BC [facility] Pulmonary/Critical Care Pager # [phone] OR # [phone] if no answer

## 2014-09-29 NOTE — Progress Notes (Signed)
CRITICAL VALUE ALERT  Critical value received:  Troponin 1.23  Date of notification:  09/29/2014  Time of notification:  0154  Critical value read back:Yes.    Nurse who received alert:  L. Vergel de Lucy Chris RN  MD notified (1st page):  Dr. Sung Amabile  Time of first page:  0215  MD notified (2nd page):  Time of second page:  Responding MD:  Dr. Sung Amabile  Time MD responded:  [phone]

## 2014-09-29 NOTE — Progress Notes (Signed)
Order to transfer pt to a tele floor. Rn called and given report. Pt transported to new room 1416.

## 2014-09-30 ENCOUNTER — Inpatient Hospital Stay (HOSPITAL_COMMUNITY): Payer: Medicare Other

## 2014-09-30 DIAGNOSIS — J9610 Chronic respiratory failure, unspecified whether with hypoxia or hypercapnia: Secondary | ICD-10-CM | POA: Diagnosis not present

## 2014-09-30 DIAGNOSIS — R7989 Other specified abnormal findings of blood chemistry: Secondary | ICD-10-CM | POA: Diagnosis not present

## 2014-09-30 DIAGNOSIS — R079 Chest pain, unspecified: Secondary | ICD-10-CM | POA: Diagnosis not present

## 2014-09-30 DIAGNOSIS — J189 Pneumonia, unspecified organism: Principal | ICD-10-CM

## 2014-09-30 DIAGNOSIS — J9611 Chronic respiratory failure with hypoxia: Secondary | ICD-10-CM | POA: Diagnosis not present

## 2014-09-30 DIAGNOSIS — N179 Acute kidney failure, unspecified: Secondary | ICD-10-CM | POA: Diagnosis not present

## 2014-09-30 LAB — LEGIONELLA ANTIGEN, URINE

## 2014-09-30 LAB — BASIC METABOLIC PANEL
Anion gap: 4 — ABNORMAL LOW (ref 5–15)
BUN: 32 mg/dL — AB (ref 6–20)
CO2: 32 mmol/L (ref 22–32)
CREATININE: 1.34 mg/dL — AB (ref 0.44–1.00)
Calcium: 8.6 mg/dL — ABNORMAL LOW (ref 8.9–10.3)
Chloride: 104 mmol/L (ref 101–111)
GFR, EST AFRICAN AMERICAN: 41 mL/min — AB (ref >60)
GFR, EST NON AFRICAN AMERICAN: 35 mL/min — AB (ref >60)
Glucose, Bld: 101 mg/dL — ABNORMAL HIGH (ref 65–99)
POTASSIUM: 4.2 mmol/L (ref 3.5–5.1)
SODIUM: 140 mmol/L (ref 135–145)

## 2014-09-30 MED ORDER — LEVOFLOXACIN 500 MG PO TABS: ORAL_TABLET | ORAL | Status: DC

## 2014-09-30 NOTE — Care Management Important Message (Signed)
Important Message  Patient Details  Name: [name] MRN: [id number] Date of Birth: 1927/11/11   Medicare Important Message Given:  Yes-second notification given    Haskell Flirt 09/30/2014, 10:51 AMImportant Message  Patient Details  Name: [name] MRN: [id number] Date of Birth: [birth year]   Medicare Important Message Given:  Yes-second notification given    Haskell Flirt 09/30/2014, 10:51 AMImportant Message  Patient Details  Name: [name] MRN: [id number] Date of Birth: 12/06/1927   Medicare Important Message Given:  Yes-second notification given    Haskell Flirt 09/30/2014, 10:51 AM

## 2014-09-30 NOTE — Progress Notes (Signed)
Patient given discharge, medication and f/u instructions, verbalized understanding, IV and telemetry removed, family to transport home. 

## 2014-09-30 NOTE — Progress Notes (Signed)
Patient Name: [name] Date of Encounter: 09/30/2014  Active Problems:   Pneumonia   Elevated troponin    Primary Cardiologist: Dr. Rennis Golden Patient Profile: 81 y.o. female with past medical history of Hypertension, Hypothyroidism, Dyslipidemia, COPD, Pulmonary fibrosis (on chronic O2 at home) and GERD admitted on 09/28/2014 for CAP after failing outpatient antibiotic treatment. Had pleuritic chest pain and troponin values of 0.03, 0.41 and 1.23.  SUBJECTIVE: Reports her breathing has improved. Also reports she is able to take a deeper breath now with decreased intensity of her right sided chest pain.  OBJECTIVE Filed Vitals:   09/29/14 0513 09/29/14 1352 09/29/14 2123 09/30/14 0648  BP: 141/65 109/56 123/48 125/50  Pulse: 77 71 67 64  Temp: 98.2 F (36.8 C) 97.7 F (36.5 C) 98.8 F (37.1 C) 98.7 F (37.1 C)  TempSrc: Oral Oral Oral Oral  Resp: Height:      Weight:      SpO2: 98% 100% 100% 100%    Intake/Output Summary (Last 24 hours) at 09/30/14 0854 Last data filed at 09/30/14 0734  Gross per 24 hour  Intake   1963 ml  Output    350 ml  Net   1613 ml   Filed Weights   09/28/14 1228  Weight: 116 lb (52.617 kg)    PHYSICAL EXAM General: Pleasant, elderly, female in no acute distress. Head: Normocephalic, atraumatic.  Neck: Supple without bruits, JVD not elevated. Lungs:  Resp regular and unlabored, Rales at bases bilaterally. Heart: RRR, S1, S2, no S3, S4, or murmur; no rub. Abdomen: Soft, non-tender, non-distended with normoactive bowel sounds. No hepatomegaly. No rebound/guarding. No obvious abdominal masses. Extremities: No clubbing, cyanosis, or edema. Distal pedal pulses are 2+ bilaterally. Neuro: Alert and oriented X 3. Moves all extremities spontaneously. Psych: Normal affect.  LABS: CBC: Recent Labs  09/28/14 1335 09/29/14 0103  WBC 12.6* 11.0*  NEUTROABS 10.0*  --   HGB 10.4* 10.7*  HCT 32.4* 33.7*  MCV 92.3 92.3  PLT  227 226   Basic Metabolic Panel: Recent Labs  09/29/14 0103 09/30/14 0535  NA 135 140  K 3.8 4.2  CL 99* 104  CO2 30 32  GLUCOSE 99 101*  BUN 33* 32*  CREATININE 1.43* 1.34*  CALCIUM 8.4* 8.6*   Liver Function Tests: Recent Labs  09/28/14 1335  AST 34  ALT 60*  ALKPHOS 116  BILITOT 1.0  PROT 6.4*  ALBUMIN 3.1*   Cardiac Enzymes: Recent Labs  09/28/14 1335 09/28/14 1819 09/29/14 0103  TROPONINI <0.03 0.41* 1.23*   BNP:  B NATRIURETIC PEPTIDE  Date/Time Value Ref Range Status  09/28/2014 01:35 PM 345.2* 0.0 - 100.0 pg/mL Final  Thyroid Function Tests: Recent Labs  09/28/14 1335  TSH 0.097*   TELE:  Sinus rhythm with rate in 50's - 70's. Occasional PVC's.      ECG: Sinus bradycardia, rate in 50's, no acute ST or T wave changes since previous tracing.  ECHO: Pending  Radiology/Studies: Ct Angio Chest Pe W/cm &/or Wo Cm: 09/29/2014   CLINICAL DATA:  81 year old female with productive cough and hemoptysis and congestion.  EXAM: CT ANGIOGRAPHY CHEST WITH CONTRAST  TECHNIQUE: Multidetector CT imaging of the chest was performed using the standard protocol during bolus administration of intravenous contrast. Multiplanar CT image reconstructions and MIPs were obtained to evaluate the vascular anatomy.  CONTRAST:  80mL OMNIPAQUE IOHEXOL 350 MG/ML SOLN  COMPARISON:  Radiograph dated 09/27/2014 and chest CT dated  08/18/2012  FINDINGS: There are emphysematous changes of the lungs. Patchy areas of airspace opacity predominantly in the subpleural regions bilaterally noted some of which may be chronic and on there is likely represent multifocal pneumonia. Clinical correlation and follow-up to resolution recommended. There is a focal area consolidation in the right middle lobe. There are cavitary lesions in the paramediastinal and infrahilar aspect of the right lower lobe, likely infectious in etiology. A cavitary neoplasm is not excluded. Clinical correlation and follow-up  recommended. No pleural effusion or pneumothorax. The central airways are patent.  The thoracic aorta appears unremarkable. There are atherosclerotic calcifications of the thoracic aorta. No CT evidence of pulmonary embolism.  Right hilar adenopathy. There is mild soft tissue thickening of the left infrahilar pericardial. Cardiomegaly. No significant pericardial effusion. Small hiatal hernia. The esophagus is predominantly collapsed.  No axillary adenopathy. The chest wall soft tissues appear unremarkable. There is extensive degenerative changes of the spine. Scoliosis of the thoracic spine. No acute fracture.  There is heterogeneous enhancement of the spleen.  Review of the MIP images confirms the above findings.  IMPRESSION: No CT evidence of pulmonary embolism.  Multifocal bilateral airspace consolidation most compatible with pneumonia. Clinical correlation and follow-up two resolution recommended.  Right infrahilar/paramediastinal cavitary consolidation. Differential includes fungal infection, abscess, TB, or cavitary neoplasm. Correlation with sputum cultures and pulmonary consult is advised. Bronchoscopy may provide better evaluation.   Electronically Signed   By: Elgie Collard M.D.   On: 09/29/2014 01:43     Current Medications:  . amLODipine  2.5 mg Oral q morning - 10a  . guaiFENesin  600 mg Oral BID  . heparin  5,000 Units Subcutaneous 3 times per day  . levofloxacin (LEVAQUIN) IV  250 mg Intravenous Q24H  . levothyroxine  88 mcg Oral QAC breakfast  . magnesium chloride  1 tablet Oral Q1200  . nebivolol  10 mg Oral BID  . pantoprazole  40 mg Oral BID AC   . lactated ringers 60 mL/hr at 09/29/14 1407    ASSESSMENT AND PLAN:  1. Chest Pain - pleuritic in quality, worse with coughing and deep breathing. Has been changing positions over the past several days. Improved since admission. - troponin values have been < 0.03, 0.41, and 1.23 and her EKG is without acute changes. -  echocardiogram to be obtained today.   2. HTN - BP has been 109/48 - 125/56 in the past 24 hours. - continue Amlodipine and Bystolic - Add Avapro and HCTZ back as appropriate pending all images are complete.  3. Community Acquired Pneumonia - per Pulmonology Team  Signed, Ellsworth Lennox , PA-C 8:54 AM 09/30/2014 Pager: [phone]  No plans for further cardiac w/u Echo is pending Pain seems clearly related to active lung disease but troponin Increased with no acute ECG changes Lung exam still with rhonchi and poor airway movement.    Charlton Haws

## 2014-09-30 NOTE — Progress Notes (Signed)
  Echocardiogram 2D Echocardiogram has been performed.  Leta Jungling M 09/30/2014, 11:27 AM

## 2014-09-30 NOTE — Discharge Summary (Signed)
Physician Discharge Summary  Patient ID: [name] MRN: [id number] DOB/AGE: 01-23-1927 81 y.o.  Admit date: 09/28/2014 Discharge date: 09/30/2014    Discharge Diagnoses:  CAP - Right sided, NOS Pulmonary Fibrosis  Oxygen Dependent - baseline 2L  Elevated Troponin / Demand Ischemia  HTN Mild AKI  Hypothyroidism                                                                      DISCHARGE PLAN BY DIAGNOSIS      CAP - Right sided, NOS Pulmonary Fibrosis  Oxygen Dependent - baseline 2L   Discharge Plan: Complete 7 days total abx Discharge on levaquin 250 mg QD (discussed with pharmacy) Follow up with Dr. Melvyn Novas as arranged  Consider follow up CXR at time of office visit  PRN Albuterol / Atrovent  Continue delsym  Elevated Troponin / Demand Ischemia  HTN  Discharge Plan: Resume home regimen:  Norasc, bystolic, HCTZ, irbesartan  Mild AKI   Discharge Plan: Resolving Consider BMP reassessment at time of office visit to review serum creatinine   Hypothyroidism   Discharge Plan: Continue synthroid                   DISCHARGE SUMMARY   [name] is a 81 y.o. y/o female, never smoker with a PMH of pulmonary fibrosis on chronic  2L O2, and recurrent pneumonia who was seen in the pulmonary office on 9/20 with persistent cough, dyspnea and new onset right sided pleuritic chest pain.  The patient had been seen previously in the clinic on 9/19 and was started on Levaquin for 7 days.  The patient reported increasing cough, weakness and shortness of breath.  CXR was evaluated which demonstrated a new right sided infiltrate.  She was admitted from the office to the medical floor.  Treatment included IV levaquin, mucinex/delsym, nebulized bronchodilators and oxygen.  She was pan cultured and cultures to date are negative.  CTA of the chest was evaluated and negative for PE but demonstrated multifocal bilateral airspace disease most compatible with PNA.  Right  infrahilar/paramediastinal cavitary consolidation.  She was assessed for cardiac source of chest pain as well.  EKG was unchanged from when compared to prior EKG in May 2016.  Troponin was assessed and peaked at 1.23.  Cardiology was consulted and felt troponin was related to demand ischemia in the setting of acute respiratory issues. The patient clinically improved and was medically cleared 9/22 for discharge.  She declined home PT.                SIGNIFICANT DIAGNOSTIC STUDIES 9/21  CTA Chest >> negative for PE, multifocal bilateral airspace disease most compatible with PNA.  Right infrahilar/paramediastinal cavitary consolidation.    ANTIBIOTICS Levaquin 9/20 >> to complete 7 days    Discharge Exam: General: well developed elderly female in NAD Neuro: AAOx4, MAE, speech clear CV: s1s2 rrr, no m/r/g PULM: resp's even/non-labored, lungs bilaterally with crackles GI: flat, soft, bsx4 active  Extremities: no acute deformities, warm/dry.  Filed Vitals:   09/29/14 2123 09/30/14 0648 09/30/14 1042 09/30/14 1458  BP: 123/48 125/50 125/51 142/54  Pulse: 67 64  74  Temp: 98.8 F (37.1 C) 98.7 F (37.1 C)  99.3 F (  37.4 C)  TempSrc: Oral Oral  Oral  Resp: 16 18  18   Height:      Weight:      SpO2: 100% 100%  100%     Discharge Labs  BMET  Recent Labs Lab 09/28/14 1335 09/29/14 0103 09/30/14 0535  NA 138 135 140  K 4.2 3.8 4.2  CL 98* 99* 104  CO2 30 30 32  GLUCOSE 114* 99 101*  BUN 33* 33* 32*  CREATININE 1.10* 1.43* 1.34*  CALCIUM 8.9 8.4* 8.6*   CBC  Recent Labs Lab 09/28/14 1335 09/29/14 0103  HGB 10.4* 10.7*  HCT 32.4* 33.7*  WBC 12.6* 11.0*  PLT 227 226    Discharge Instructions    Call MD for:  difficulty breathing, headache or visual disturbances    Complete by:  As directed      Call MD for:  extreme fatigue    Complete by:  As directed      Call MD for:  hives    Complete by:  As directed      Call MD for:  persistant dizziness or  light-headedness    Complete by:  As directed      Call MD for:  persistant nausea and vomiting    Complete by:  As directed      Call MD for:  severe uncontrolled pain    Complete by:  As directed      Call MD for:  temperature >100.4    Complete by:  As directed      Diet general    Complete by:  As directed      Discharge instructions    Complete by:  As directed   1. Review your medications carefully 2. Follow up with Dr. Melvyn Novas as scheduled  3. Complete 4 more days of Levaquin 249m daily.  You will need to cut the tablets you have at home in half.  If needed you can purchase a pill splitter at any drug store.   4. Oxygen as previously prescribed.  5. Call our office if you have any questions regarding levaquin.     Increase activity slowly    Complete by:  As directed                 Follow-up Information    Follow up with MChristinia Gully MD On 10/04/2014.   Specialty:  Pulmonary Disease   Why:  Appt at 2:30    Contact information:   [address] [place] [phone]      Follow up with KLujean Amel MD.   Specialty:  Family Medicine   Why:  As needed   Contact information:   [address] [phone]         Medication List    TAKE these medications        albuterol (5 MG/ML) 0.5% nebulizer solution  Commonly known as:  PROVENTIL  Take 0.5 mL (2.5 mg total) by nebulization every 6 (six) hours as needed for wheezing.     albuterol 108 (90 BASE) MCG/ACT inhaler  Commonly known as:  PROVENTIL HFA;VENTOLIN HFA  Inhale 2 puffs into the lungs every 4 (four) hours as needed for wheezing or shortness of breath. For shortness of breath     amLODipine 2.5 MG tablet  Commonly known as:  NORVASC  Take 2.5 mg by mouth every morning.     BYSTOLIC 10 MG tablet  Generic  drug:  nebivolol  TAKE ONE TABLET BY MOUTH ONE TIME DAILY     calcium carbonate 600 MG Tabs tablet  Commonly known as:  OS-CAL   Take 600 mg by mouth daily with lunch.     chlorpheniramine 4 MG tablet  Commonly known as:  CHLOR-TRIMETON  Take 4-8 mg by mouth at bedtime as needed (sleep).     DELSYM 30 MG/5ML liquid  Generic drug:  dextromethorphan  Take 2 tsp twice a day as needed for cough     diphenhydramine-acetaminophen 25-500 MG Tabs  Commonly known as:  TYLENOL PM  Take 1-2 tablets by mouth at bedtime as needed (sleep).     GLUCOSAMINE CHONDR COMPLEX PO  Take 1 tablet by mouth daily at 12 noon.     hydrochlorothiazide 25 MG tablet  Commonly known as:  HYDRODIURIL  Take 25 mg by mouth every morning.     ipratropium 0.06 % nasal spray  Commonly known as:  ATROVENT  Place 2 sprays into both nostrils 4 (four) times daily.     irbesartan 300 MG tablet  Commonly known as:  AVAPRO  TAKE ONE TABLET BY MOUTH ONE TIME DAILY     levofloxacin 500 MG tablet  Commonly known as:  LEVAQUIN  Take 250 mg (1/2 tab) of levaquin daily for 4 more days.  You will need to cut the tablets you have at home in half.     levothyroxine 88 MCG tablet  Commonly known as:  SYNTHROID, LEVOTHROID  Take 88 mcg by mouth daily before breakfast.     Magnesium 250 MG Tabs  Take 1 tablet by mouth daily at 12 noon.     methocarbamol 500 MG tablet  Commonly known as:  ROBAXIN  Take 500 mg by mouth 2 (two) times daily as needed for muscle spasms.     metroNIDAZOLE 0.75 % cream  Commonly known as:  METROCREAM  Apply 1 application topically 2 (two) times daily as needed. To the face as directed     naproxen sodium 220 MG tablet  Commonly known as:  ANAPROX  Take 220 mg by mouth 2 (two) times daily as needed (pain). Per bottle as needed for joint pain     OXYGEN  2 l/m 24/7     pantoprazole 40 MG tablet  Commonly known as:  PROTONIX  Take 1 tablet (40 mg total) by mouth 2 (two) times daily.     SYSTANE OP  Place 1 drop into both eyes every morning. Or as needed for dry eyes     traMADol 50 MG tablet  Commonly known  as:  ULTRAM  One every 4 hours for pain or cough          Disposition: Home. No new home health needs identified.    Discharged Condition: [name] has met maximum benefit of inpatient care and is medically stable and cleared for discharge.  Patient is pending follow up as above.      Time spent on disposition:  Greater than 35 minutes.   Signed: Noe Gens, NP-C [facility] Pgr: [phone] Office: [phone]

## 2014-10-04 ENCOUNTER — Inpatient Hospital Stay: Payer: Medicare Other | Admitting: Internal Medicine

## 2014-10-12 ENCOUNTER — Other Ambulatory Visit: Payer: Self-pay | Admitting: Family Medicine

## 2014-10-12 ENCOUNTER — Ambulatory Visit
Admission: RE | Admit: 2014-10-12 | Discharge: 2014-10-12 | Disposition: A | Discharge status: Home / Self Care | Payer: Medicare Other | Source: Ambulatory Visit | Attending: Family Medicine | Admitting: Family Medicine

## 2014-10-12 DIAGNOSIS — J189 Pneumonia, unspecified organism: Secondary | ICD-10-CM

## 2014-10-15 ENCOUNTER — Encounter: Payer: Self-pay | Admitting: Internal Medicine

## 2014-10-15 ENCOUNTER — Ambulatory Visit (INDEPENDENT_AMBULATORY_CARE_PROVIDER_SITE_OTHER): Payer: Medicare Other | Admitting: Internal Medicine

## 2014-10-15 VITALS — BP 98/58 | HR 74 | Ht 63.0 in | Wt 116.8 lb

## 2014-10-15 DIAGNOSIS — J189 Pneumonia, unspecified organism: Secondary | ICD-10-CM | POA: Diagnosis not present

## 2014-10-15 DIAGNOSIS — Z23 Encounter for immunization: Secondary | ICD-10-CM

## 2014-10-15 DIAGNOSIS — J8410 Pulmonary fibrosis, unspecified: Secondary | ICD-10-CM

## 2014-10-15 DIAGNOSIS — J9611 Chronic respiratory failure with hypoxia: Secondary | ICD-10-CM

## 2014-10-15 NOTE — Patient Instructions (Addendum)
No change in medications or 02  I recommend you receive the Prevnar 13 vaccination  if you have not received it already   Please schedule a follow up office visit in 6 weeks, call sooner if needed

## 2014-10-15 NOTE — Progress Notes (Signed)
Subjective:     Patient ID: [name], female   DOB: [birth year]   MRN: [id number]    Brief patient profile:  81 yowf never sign smoker followed by [name] remotely with h/o recurrent pna as infant and extending into adulthood as "freq bronchitis" then middle age seemed less freq, less severe and then wose x 2011/12 but never on 02 24/7 until p hosp for an illness that started in May 2014 and resulted in hosp [facility] with the following d/c summary :    Admit date: 08/23/2012  Discharge date: 08/25/2012  Discharge Diagnoses:  CAP (community acquired pneumonia)  COPD (chronic obstructive pulmonary disease)  Chronic respiratory failure  GERD (gastroesophageal reflux disease)  Cirrhosis       07/14/2014  Acute  ov/Shelia Magallon re:  No meds/ no med caledar  Chief Complaint  Patient presents with  . Follow-up    Pt c/o increased SOB, wheezing, prod cough with white mucus x 1 month. Fever at bedtime.   Breathing worse x one month, then suddenly worse x 2 days ago but still comfortable at rest  >Zpack    08/19/14- 81 yoF former smoker followed by Dr Melvyn Novas for chronic bronchitis, hx pneumonias O2 2L sleep and exertion/ APS   son is here ACUTE VISIT: MW patient;Pt states she was coughing today and coughed up blood tinged sputum-only had this happen once before. Always has SOB and wheezing as well. Shows brown clot size of pencil eraser in thick clear mucus on Kleenex. Treated with Z-Pak at last visit. Small lung clot in otherwise clear/yellow sputum today.   rec Script sent for augmentin antibiotic Script sent for prednisone for 4 days Ok to use the tramadol and Delsym for cough as needed Please make an appointment to see Dr Melvyn Novas again for recheck in next month or so.   09/27/14 Acute OV : Recurrent PNA /Pulmonary Fibrosis  Pt presents for an acute office visit . Seen one ago with persistent cough . She was treated with prednisone taper .  She does not feel much better and now has right sided rib  pain when she coughs or takes in deep breath. Some soreness to touch on the ribs on right.  Labs last week showed ESR at 67 . Nml WBC and stable hbg. Cough is increased with thick white/cream mucus.  Denies fever, hemoptysis, exertional chest pain, syncope or calf pain. No increased leg swelling .  Appetite is fair with no n/v/d.  Marland Kitchen Completed pred taper this morning. She remains on 2l/m O2 with sats 97% today in office  CXR today shows right mid lung infiltrate.  >>tx w/ levaquin x 7 d   09/28/2014 Acute OV : CAP and Pulmonary Fibrosis  Pt returns for persistent cough, right sided pleuritic pain and dyspnea.  Pt was seen yesterday in office with persistent dyspnea, increased cough and new onset right sided pleuritic rib pain.CXR showed right mid lung infiltrate c/w PNA . She was started on levaquin x 7days .   Says she does not feel any better since starting antibiotics and is weak.  Has congested cough with thick white mucus.  Was seen 1 week ago with ?PF flare , tx w/ steroid taper. ESR was elevated at 67. WBC was nml.  She has been having recurrent increased cough and congestion for last 2 months. Seen in July tx w/ Zpack . Returned in August and tx w/ Augmentin for bronchitis . Says she gets a little better but symptoms  never resolved completely and are worse for last 3 days. She did cough of blood tinged mucus in August that was self limiting . No further blood since that time.  Remains on O2 at 2l/m with sats 96% in office today.  rec Admit   Admit date: 09/28/2014 Discharge date: 09/30/2014    Discharge Diagnoses:  CAP - Right sided, NOS Pulmonary Fibrosis  Oxygen Dependent - baseline 2L  Elevated Troponin / Demand Ischemia  HTN Mild AKI  Hypothyroidism    DISCHARGE PLAN BY DIAGNOSIS     CAP - Right sided, NOS Pulmonary Fibrosis  Oxygen  Dependent - baseline 2L   Discharge Plan: Complete 7 days total abx Discharge on levaquin 250 mg QD (discussed with pharmacy) Follow up with Dr. Melvyn Novas as arranged  Consider follow up CXR at time of office visit  PRN Albuterol / Atrovent  Continue delsym  Elevated Troponin / Demand Ischemia  HTN  Discharge Plan: Resume home regimen: Norasc, bystolic, HCTZ, irbesartan       DISCHARGE SUMMARY  [name] is a 81 y.o. y/o female, never smoker with a PMH of pulmonary fibrosis on chronic 2L O2, and recurrent pneumonia who was seen in the pulmonary office on 9/20 with persistent cough, dyspnea and new onset right sided pleuritic chest pain. The patient had been seen previously in the clinic on 9/19 and was started on Levaquin for 7 days. The patient reported increasing cough, weakness and shortness of breath. CXR was evaluated which demonstrated a new right sided infiltrate. She was admitted from the office to the medical floor. Treatment included IV levaquin, mucinex/delsym, nebulized bronchodilators and oxygen. She was pan cultured and cultures to date are negative. CTA of the chest was evaluated and negative for PE but demonstrated multifocal bilateral airspace disease most compatible with PNA. Right infrahilar/paramediastinal cavitary consolidation. She was assessed for cardiac source of chest pain as well. EKG was unchanged from when compared to prior EKG in May 2016. Troponin was assessed and peaked at 1.23. Cardiology was consulted and felt troponin was related to demand ischemia in the setting of acute respiratory issues. The patient clinically improved and was medically cleared 9/22 for discharge. She declined home PT.     SIGNIFICANT DIAGNOSTIC STUDIES 9/21 CTA Chest >> negative for PE, multifocal bilateral airspace disease most compatible with PNA. Right  infrahilar/paramediastinal cavitary consolidation.   ANTIBIOTICS Levaquin 9/20 >> to complete 7 days   10/15/2014 ext post hosp  f/u ov/Tiernan Millikin re:   PF ? All from ALI from recurrent CAP Chief Complaint  Patient presents with  . HFU    Pt states that her breathing has returned to baseline. She still has some cough when she lies down at night- non prod.   no fever, cp, purulent sputum/ back to 2lpm 24/7 with doe x more than slow adls  No obvious day to day or daytime variability or assoc  cp or chest tightness, subjective wheeze or overt sinus or hb symptoms. No unusual exp hx or h/o childhood pna/ asthma or knowledge of premature birth.  Sleeping ok without nocturnal  or early am exacerbation  of respiratory  c/o's or need for noct saba. Also denies any obvious fluctuation of symptoms with weather or environmental changes or other aggravating or alleviating factors except as outlined above   Current Medications, Allergies, Complete Past Medical History, Past Surgical History, Family History, and Social History were reviewed in [employer] record.  ROS  The following are not  active complaints unless bolded sore throat, dysphagia, dental problems, itching, sneezing,  nasal congestion or excess/ purulent secretions, ear ache,   fever, chills, sweats, unintended wt loss, classically pleuritic or exertional cp, hemoptysis,  orthopnea pnd or leg swelling, presyncope, palpitations, abdominal pain, anorexia, nausea, vomiting, diarrhea  or change in bowel or bladder habits, change in stools or urine, dysuria,hematuria,  rash, arthralgias, visual complaints, headache, numbness, weakness or ataxia or problems with walking uses cane or coordination,  change in mood/affect or memory.                Objective:  amb somber wf nad on O2 , walks with cane    Vital signs reviewed  Wt Readings from Last 3 Encounters:  10/15/14 116 lb 12.8 oz (52.98 kg)  09/28/14 116 lb (52.617 kg)   09/28/14 116 lb (52.617 kg)    Vital signs reviewed   HEENT: nl dentition, turbinates, and orophanx. Nl external ear canals without cough reflex   NECK :  without JVD/Nodes/TM/ nl carotid upstrokes bilaterally   LUNGS: no acc muscle use,   mild coarse insp and exp rhonchi bilaterally,    CV:  RRR  no s3 or murmur or increase in P2, tr edema   ABD:  soft and nontender with nl excursion in the supine position. No bruits or organomegaly, bowel sounds nl  MS:  warm without deformities, calf tenderness, cyanosis or clubbing  SKIN: warm and dry without lesions    NEURO:  alert, approp, no deficits     I personally reviewed images and agree with radiology impression as follows:  CXR:  10/12/14 Findings consistent with pulmonary fibrosis. No acute abnormality seen.                    Assessment:

## 2014-10-16 ENCOUNTER — Encounter: Payer: Self-pay | Admitting: Internal Medicine

## 2014-10-16 NOTE — Assessment & Plan Note (Addendum)
first noted on CT chest 02/13/2002  - off prednisone since around 09/08/2012  - 10/08/2012 PFTs VC  1.45 s obst and DLCO 56 corrects to 111 - 01/05/2013  PFTs VC  1.44 s obst dlco 62 correccts to 123  - 11/02/2013  PFT's VC  1.29 (59%) dlco 48 corrects to 97%  - ESR 10/08/2012 = 59  - down to 45 11/19/12 and 42 12/291/4 > no change 04/29/13 - 11/02/2013  Walked RA  2 laps @ 185 ft each stopped due to  Legs gave out before breathing, no desats, slow pace   - 02/02/2014   Walked RA x one lap @ 185 stopped due to  desat to 85 % but no sob, slow pace  - 09/20/2014   Walked RA x one lap @ 185 stopped due to  Sob/ slow pace, no desat   She remains on O2 after her hospitalization which was apparently precipitated by a pneumonia although I can't be sure she doesn't have a form of pulmonary fibrosis with an acute phase masquerading as pneumonia, one does not normally see chest pain with this pattern of pulmonary fibrosis flare. Since this responded to antibiotics and recommended no further antibiotics or prednisone and watch and wait approach  I had an extended discussion with the patient /daughter reviewing all relevant studies completed to date and  lasting 25 minutes of a 40 minute visit    Each maintenance medication was reviewed in detail including most importantly the difference between maintenance and prns and under what circumstances the prns are to be triggered using an action plan format that is not reflected in the computer generated alphabetically organized AVS.    Please see instructions for details which were reviewed in writing and the patient given a copy highlighting the part that I personally wrote and discussed at today's ov.

## 2014-10-16 NOTE — Assessment & Plan Note (Signed)
Chest x-ray from 10/12/2014 was reviewed and is encouraging.  However, there is an area in the right lower lobe that  appears cavitary on CT scanning and can not be well seen on PA and lateral chest x-ray. This could represent an area of neoplasm but would be a a moot issue in this setting,  At least for now, and the hope is this was just a necrotizing pneumonia, although note the pain that she experienced was anterior/lateral on the right, not posterioror/ medial where the area of concern is located on ct.[name] follow her clinically and also repeat   chest x-ray with a CT scan only if symptoms warrant.  Discussed in detail all the  indications, usual  risks and alternatives  relative to the benefits with patient who agrees to proceed with conservative f/u as outlined

## 2014-11-01 ENCOUNTER — Encounter: Payer: Self-pay | Admitting: Internal Medicine

## 2014-11-01 ENCOUNTER — Telehealth: Payer: Self-pay | Admitting: *Deleted

## 2014-11-01 ENCOUNTER — Ambulatory Visit (INDEPENDENT_AMBULATORY_CARE_PROVIDER_SITE_OTHER): Payer: Medicare Other | Admitting: Internal Medicine

## 2014-11-01 VITALS — BP 128/64 | HR 73 | Ht 63.0 in | Wt 117.0 lb

## 2014-11-01 DIAGNOSIS — J9611 Chronic respiratory failure with hypoxia: Secondary | ICD-10-CM

## 2014-11-01 DIAGNOSIS — R0600 Dyspnea, unspecified: Secondary | ICD-10-CM

## 2014-11-01 DIAGNOSIS — J8410 Pulmonary fibrosis, unspecified: Secondary | ICD-10-CM | POA: Diagnosis not present

## 2014-11-01 LAB — PULMONARY FUNCTION TEST
DL/VA % PRED: 104 %
DL/VA: 4.88 ml/min/mmHg/L
DLCO UNC: 11.6 ml/min/mmHg
DLCO unc % pred: 50 %
FEF 25-75 POST: 1.15 L/s
FEF 25-75 Pre: 1.01 L/sec
FEF2575-%CHANGE-POST: 14 %
FEF2575-%PRED-POST: 109 %
FEF2575-%Pred-Pre: 96 %
FEV1-%CHANGE-POST: 3 %
FEV1-%PRED-PRE: 72 %
FEV1-%Pred-Post: 74 %
FEV1-POST: 1.22 L
FEV1-Pre: 1.18 L
FEV1FVC-%CHANGE-POST: 4 %
FEV1FVC-%PRED-PRE: 107 %
FEV6-%Change-Post: 0 %
FEV6-%PRED-POST: 71 %
FEV6-%Pred-Pre: 71 %
FEV6-PRE: 1.5 L
FEV6-Post: 1.49 L
FEV6FVC-%Change-Post: 0 %
FEV6FVC-%PRED-PRE: 106 %
FEV6FVC-%Pred-Post: 106 %
FVC-%CHANGE-POST: -1 %
FVC-%PRED-POST: 66 %
FVC-%PRED-PRE: 67 %
FVC-POST: 1.49 L
FVC-PRE: 1.51 L
POST FEV1/FVC RATIO: 82 %
PRE FEV6/FVC RATIO: 100 %
Post FEV6/FVC ratio: 100 %
Pre FEV1/FVC ratio: 78 %
RV % PRED: 67 %
RV: 1.67 L
TLC % pred: 60 %
TLC: 2.95 L

## 2014-11-01 NOTE — Assessment & Plan Note (Signed)
-   09/05/12 reported amb off 02 with sats > 90 to mailbox and back  - 10/08/2012   Walked RA x 2 laps fast pace @ 185 stopped due to  Legs gave out with sats 94% - 11/19/2012  Walked RA  2 laps @ 185 ft each stopped due to  Weak legs - 04/29/2013  Walked RA  2 laps @ 185 ft each stopped due to fatigue, no sob or desat - 11/02/2013  Walked RA  2 laps @ 185 ft each stopped due to  Legs gave out before breathing, no desats, slow pace - 02/02/2014   Walked RA x one lap @ 185 stopped due to  desat to 85 % but no sob, slow pace  -  09/20/2014   Walked RA x one lap @ 185 stopped due to  Sob/ slow pace, no desat - 11/01/2014  Walked RA x 3 laps @ 185 ft each stopped due to      rec 11/01/2014  02 2lpm at hs, not needed at rest or room to room, 2lpm prn exertion but probably not needed

## 2014-11-01 NOTE — Patient Instructions (Addendum)
Wear 02 2lpm at bedtime and with heavy exertion (though you may find with trial and error you do just as well off it because you don't have to carry it)  Please schedule a follow up visit in 3 months but call sooner if needed  -Check on follow up re whether received Prevnar 13

## 2014-11-01 NOTE — Telephone Encounter (Signed)
Called and spoke with Synetta FailAnita at Dr Glendale ChardKoirala's office  Pt had Prevnar 13 in 2015  This was documented in her chart

## 2014-11-01 NOTE — Progress Notes (Signed)
PFT done today. 

## 2014-11-01 NOTE — Telephone Encounter (Signed)
-----   Message from Nyoka CowdenMichael B Wert, MD sent at 11/01/2014  2:42 PM EDT ----- Call koirala's office to see if she got prevnar yet and document when if she did

## 2014-11-01 NOTE — Assessment & Plan Note (Signed)
Followed in Pulmonary clinic/  Healthcare/ Melvyn Novas  - first noted on CT chest 02/13/2002  - off prednisone since around 09/08/2012  - 10/08/2012 PFTs VC  1.45 s obst and DLCO 56 corrects to 111 - 01/05/2013  PFTs VC  1.44 s obst dlco 62 correccts to 123  - 11/02/2013  PFT's VC  1.29 (59%) dlco 48 corrects to 97%  - PFT's  11/01/2014  FEV1 1.28 (57% % )  with DLCO  50 % corrects to 104 % for alv volume   - ESR 10/08/2012 = 59  - down to 45 11/19/12 and 42 12/291/4 > no change 04/29/13 - 11/02/2013  Walked RA  2 laps @ 185 ft each stopped due to  Legs gave out before breathing, no desats, slow pace   - 02/02/2014   Walked RA x one lap @ 185 stopped due to  desat to 85 % but no sob, slow pace  - 09/20/2014   Walked RA x one lap @ 185 stopped due to  Sob/ slow pace, no desat - .11/01/2014  Walked RA x 3 laps @ 185 ft each stopped due to end of study slow pace, sob but no desats   Clearly improving with no decline in VC or dlco x 1.5 y typical of pf p ali, not uip > no change rx needed  I had an extended discussion with the patient reviewing all relevant studies completed to date and  lasting 15 to 20 minutes of a 25 minute visit    Each maintenance medication was reviewed in detail including most importantly the difference between maintenance and prns and under what circumstances the prns are to be triggered using an action plan format that is not reflected in the computer generated alphabetically organized AVS.    Please see instructions for details which were reviewed in writing and the patient given a copy highlighting the part that I personally wrote and discussed at today's ov.

## 2014-11-01 NOTE — Progress Notes (Signed)
Subjective:     Patient ID: [name], female   DOB: [birth year]   MRN: [id number]    Brief patient profile:  81 yowf never sign smoker followed by [name] remotely with h/o recurrent pna as infant and extending into adulthood as "freq bronchitis" then middle age seemed less freq, less severe and then wose x 2011/12 but never on 02 24/7 until p hosp for an illness that started in May 2014 and resulted in hosp at [facility] with the following d/c summary :    Admit date: 08/23/2012  Discharge date: 08/25/2012  Discharge Diagnoses:  CAP (community acquired pneumonia)  COPD (chronic obstructive pulmonary disease)  Chronic respiratory failure  GERD (gastroesophageal reflux disease)  Cirrhosis       07/14/2014  Acute  ov/[name] re:  No meds/ no med caledar  Chief Complaint  Patient presents with  . Follow-up    Pt c/o increased SOB, wheezing, prod cough with white mucus x 1 month. Fever at bedtime.   Breathing worse x one month, then suddenly worse x 2 days ago but still comfortable at rest  >Zpack    08/19/14- 87 yoF former smoker followed by Dr Melvyn Novas for chronic bronchitis, hx pneumonias O2 2L sleep and exertion/ APS   son is here ACUTE VISIT: MW patient;Pt states she was coughing today and coughed up blood tinged sputum-only had this happen once before. Always has SOB and wheezing as well. Shows brown clot size of pencil eraser in thick clear mucus on Kleenex. Treated with Z-Pak at last visit. Small lung clot in otherwise clear/yellow sputum today.   rec Script sent for augmentin antibiotic Script sent for prednisone for 4 days Ok to use the tramadol and Delsym for cough as needed Please make an appointment to see Dr Melvyn Novas again for recheck in next month or so.   09/27/14 Acute OV : 81 yoF Recurrent PNA /Pulmonary Fibrosis  Pt presents for an acute office visit . Seen one ago with persistent cough . She was treated with prednisone taper .  She does not feel much better and now has right sided rib  pain when she coughs or takes in deep breath. Some soreness to touch on the ribs on right.  Labs last week showed ESR at 67 . Nml WBC and stable hbg. Cough is increased with thick white/cream mucus.  Denies fever, hemoptysis, exertional chest pain, syncope or calf pain. No increased leg swelling .  Appetite is fair with no n/v/d.  Marland Kitchen Completed pred taper this morning. She remains on 2l/m O2 with sats 97% today in office  CXR today shows right mid lung infiltrate.  >>tx w/ levaquin x 7 d   09/28/2014 Acute OV : CAP and Pulmonary Fibrosis  Pt returns for persistent cough, right sided pleuritic pain and dyspnea.  Pt was seen yesterday in office with persistent dyspnea, increased cough and new onset right sided pleuritic rib pain.CXR showed right mid lung infiltrate c/w PNA . She was started on levaquin x 7days .   Says she does not feel any better since starting antibiotics and is weak.  Has congested cough with thick white mucus.  Was seen 1 week ago with ?PF flare , tx w/ steroid taper. ESR was elevated at 67. WBC was nml.  She has been having recurrent increased cough and congestion for last 2 months. Seen in July tx w/ Zpack . Returned in August and tx w/ Augmentin for bronchitis . Says she gets a little better but symptoms  never resolved completely and are worse for last 3 days. She did cough of blood tinged mucus in August that was self limiting . No further blood since that time.  Remains on O2 at 2l/m with sats 96% in office today.  rec Admit   Admit date: 09/28/2014 Discharge date: 09/30/2014    Discharge Diagnoses:  CAP - Right sided, NOS Pulmonary Fibrosis  Oxygen Dependent - baseline 2L  Elevated Troponin / Demand Ischemia  HTN Mild AKI  Hypothyroidism    DISCHARGE PLAN BY DIAGNOSIS     CAP - Right sided, NOS Pulmonary Fibrosis  Oxygen  Dependent - baseline 2L   Discharge Plan: Complete 7 days total abx Discharge on levaquin 250 mg QD (discussed with pharmacy) Follow up with Dr. Melvyn Novas as arranged  Consider follow up CXR at time of office visit  PRN Albuterol / Atrovent  Continue delsym  Elevated Troponin / Demand Ischemia  HTN  Discharge Plan: Resume home regimen: Norasc, bystolic, HCTZ, irbesartan       DISCHARGE SUMMARY  [name] is a 81 y.o. y/o female, never smoker with a PMH of pulmonary fibrosis on chronic 2L O2, and recurrent pneumonia who was seen in the pulmonary office on 9/20 with persistent cough, dyspnea and new onset right sided pleuritic chest pain. The patient had been seen previously in the clinic on 9/19 and was started on Levaquin for 7 days. The patient reported increasing cough, weakness and shortness of breath. CXR was evaluated which demonstrated a new right sided infiltrate. She was admitted from the office to the medical floor. Treatment included IV levaquin, mucinex/delsym, nebulized bronchodilators and oxygen. She was pan cultured and cultures to date are negative. CTA of the chest was evaluated and negative for PE but demonstrated multifocal bilateral airspace disease most compatible with PNA. Right infrahilar/paramediastinal cavitary consolidation. She was assessed for cardiac source of chest pain as well. EKG was unchanged from when compared to prior EKG in May 2016. Troponin was assessed and peaked at 1.23. Cardiology was consulted and felt troponin was related to demand ischemia in the setting of acute respiratory issues. The patient clinically improved and was medically cleared 9/22 for discharge. She declined home PT.     SIGNIFICANT DIAGNOSTIC STUDIES 9/21 CTA Chest >> negative for PE, multifocal bilateral airspace disease most compatible with PNA. Right  infrahilar/paramediastinal cavitary consolidation.   ANTIBIOTICS Levaquin 9/20 >> to complete 7 days   10/15/2014 ext post hosp  f/u ov/[name] re:   PF ? All from ALI from recurrent CAP Chief Complaint  Patient presents with  . HFU    Pt states that her breathing has returned to baseline. She still has some cough when she lies down at night- non prod.   no fever, cp, purulent sputum/ back to 2lpm 24/7 with doe x more than slow adls rec No change in medications or 02 I recommend you receive the Prevnar 13 vaccination  if you have not received it already > can not recall whether ever given    11/01/2014  f/u ov/[name] re: s/p ALI/ pf / 02 dep at hs  Chief Complaint  Patient presents with  . Follow-up    PFT done today. Pt states her breathing is unchanged. She rarely uses albuterol inhaler, but uses neb with albuterol almost daily.   doe = MMRC2 = can't walk a nl pace on a flat grade s sob   No obvious day to day or daytime variability or assoc cough or  cp or chest tightness, subjective wheeze or overt sinus or hb symptoms. No unusual exp hx or h/o childhood pna/ asthma or knowledge of premature birth.  Sleeping ok without nocturnal  or early am exacerbation  of respiratory  c/o's or need for noct saba. Also denies any obvious fluctuation of symptoms with weather or environmental changes or other aggravating or alleviating factors except as outlined above   Current Medications, Allergies, Complete Past Medical History, Past Surgical History, Family History, and Social History were reviewed in [employer] record.  ROS  The following are not active complaints unless bolded sore throat, dysphagia, dental problems, itching, sneezing,  nasal congestion or excess/ purulent secretions, ear ache,   fever, chills, sweats, unintended wt loss, classically pleuritic or exertional cp, hemoptysis,  orthopnea pnd or leg swelling, presyncope, palpitations, abdominal pain, anorexia,  nausea, vomiting, diarrhea  or change in bowel or bladder habits, change in stools or urine, dysuria,hematuria,  rash, arthralgias, visual complaints, headache, numbness, weakness or ataxia or problems with walking uses cane or coordination,  change in mood/affect or memory.                Objective:  amb somber wf nad on O2 , walks with cane    Vital signs reviewed  11/01/2014      117  Wt Readings from Last 3 Encounters:  10/15/14 116 lb 12.8 oz (52.98 kg)  09/28/14 116 lb (52.617 kg)  09/28/14 116 lb (52.617 kg)    Vital signs reviewed   HEENT: nl dentition, turbinates, and orophanx. Nl external ear canals without cough reflex   NECK :  without JVD/Nodes/TM/ nl carotid upstrokes bilaterally   LUNGS: no acc muscle use,   Min pops/ ronchi bases bilaterally    CV:  RRR  no s3 or murmur or increase in P2, tr edema   ABD:  soft and nontender with nl excursion in the supine position. No bruits or organomegaly, bowel sounds nl  MS:  warm without deformities, calf tenderness, cyanosis or clubbing  SKIN: warm and dry without lesions    NEURO:  alert, approp, no deficits     I personally reviewed images and agree with radiology impression as follows:  CXR:  10/12/14 Findings consistent with pulmonary fibrosis. No acute abnormality seen.                    Assessment:

## 2014-11-02 ENCOUNTER — Other Ambulatory Visit: Payer: Self-pay | Admitting: *Deleted

## 2014-11-02 DIAGNOSIS — J310 Chronic rhinitis: Secondary | ICD-10-CM

## 2014-11-02 MED ORDER — IPRATROPIUM BROMIDE 0.06 % NA SOLN: 2.0000 | Freq: Four times a day (QID) | NASAL | Status: DC

## 2014-11-17 ENCOUNTER — Other Ambulatory Visit: Payer: Self-pay | Admitting: Obstetrics and Gynecology

## 2014-11-18 LAB — CYTOLOGY - PAP

## 2014-11-26 ENCOUNTER — Ambulatory Visit: Payer: Medicare Other | Admitting: Internal Medicine

## 2014-11-26 ENCOUNTER — Other Ambulatory Visit: Payer: Self-pay | Admitting: Internal Medicine

## 2014-12-07 ENCOUNTER — Ambulatory Visit (INDEPENDENT_AMBULATORY_CARE_PROVIDER_SITE_OTHER): Payer: Medicare Other | Admitting: Internal Medicine

## 2014-12-07 ENCOUNTER — Encounter: Payer: Self-pay | Admitting: Internal Medicine

## 2014-12-07 VITALS — BP 130/60 | HR 69 | Ht 63.0 in | Wt 118.2 lb

## 2014-12-07 DIAGNOSIS — R778 Other specified abnormalities of plasma proteins: Secondary | ICD-10-CM

## 2014-12-07 DIAGNOSIS — R7989 Other specified abnormal findings of blood chemistry: Secondary | ICD-10-CM

## 2014-12-07 DIAGNOSIS — I10 Essential (primary) hypertension: Secondary | ICD-10-CM

## 2014-12-07 DIAGNOSIS — J8410 Pulmonary fibrosis, unspecified: Secondary | ICD-10-CM

## 2014-12-07 MED ORDER — ASPIRIN EC 81 MG PO TBEC: 81.0000 mg | DELAYED_RELEASE_TABLET | Freq: Every day | ORAL | Status: DC

## 2014-12-07 NOTE — Patient Instructions (Signed)
Dr Rennis GoldenHilty has recommended making the following medication changes: START Aspirin 81 mg - take 1 tablet by mouth daily  Your physician recommends that you schedule a follow-up appointment in 6 months. You will receive a reminder letter in the mail two months in advance. If you don't receive a letter, please call our office to schedule the follow-up appointment.  If you need a refill on your cardiac medications before your next appointment, please call your pharmacy.

## 2014-12-07 NOTE — Progress Notes (Signed)
OFFICE NOTE  Chief Complaint:  Follow-up, no complaints  Primary Care Physician: Darrow BussingKOIRALA,DIBAS, MD  HPI:  [name]  is an 81 year old female with a history of knee surgery on the right, low back surgery, hypertension, hypothyroidism, mild dyslipidemia and GERD. She was low risk for surgery, had an echo which showed a normal EF, but has had right lower extremity swelling. She reports she thought she had prior stripping to the left greater saphenous vein graft in the past. She underwent arterial and venous Dopplers of the legs on December 11, 2011 which showed a slightly reduced ABI on the right with an increased velocity at the popliteal artery suggesting 50% reduction, however, normal ABI on the left. The venous Dopplers indicated no evidence of venous insufficiency and all of the visualized vessels were present, including both the left and right greater saphenous veins, suggesting that if they had performed prior venous stripping that perhaps she had an accessory vessel removed but it was not the greater saphenous vein. Overall there is no clear evidence to support venous ablation or other treatment.   She has recently complained of worsening shortness of breath. She is known to have COPD and pulmonary fibrosis. Recently, perhaps over the past few weeks, she has had worsening shortness of breath ultimately requiring home oxygen. She tells me that her O2 saturations were less than 80% in the office. She was not treated with antibiotics per her report nor given steroids. I do not see a chest x-ray since 2013. At that time a CT scan was recommended to further delineate the extent of her pulmonary fibrosis. She reports that she is having ongoing fevers and chills. Her temperature actually was 100.1 last evening and her MAXIMUM TEMPERATURE was 101.9. She is not coughing up any sputum but has significant bilateral pleuritic chest pain which is worse in the axillary areas. She is markedly fatigued  and appears in mild distress.  Her laboratory work demonstrated a leukocytosis with a left shift. And the CT scan demonstrated multilobular pneumonia superimposed on lumbar fibrosis. Based on these findings I recommended starting her on Avelox which she took for 5 days. She initially had small improvement in her symptoms, but did not get substantively better. I did recommend holding her diuretic and this resulted in improved renal function. At that point I recommended she present to the hospital as she was felt to fail outpatient antibiotic therapy. She was admitted and spent several days in the hospital receiving broad-spectrum IV antibiotics with a good response. She was discharged and returned for followup with some improvement in her breathing and energy level. She does report a small amount of bilateral lower extremity edema which is dependent, probably related to the fact that we stopped her diuretic.   [name] returned and reported continued breathlessness. Unfortunately she did not have any improvement with steroids. She continues to use oxygen at night or during exertion although does feel short of breath almost all the time. She is now back on Maxzide every other day which does help with her lower extremity swelling. It should be noted that her blood pressure is elevated today and had been running in the 150 to 160 systolic range for several weeks.   [name] returns today and is clinically stable. Blood pressure is significantly elevated. She reports it has been up for several weeks. She is on irbesartan now 300 mg daily in addition to by systolic 5 mg and Maxzide. Using oxygen with exertion  if not continuously secondary to her pulmonary fibrosis.  [name] returns today and notably her blood pressure is much improved at 140/62. I previously increased her by systolic to 10 mg daily. She reports no significant change in shortness of breath and has significant pulmonary fibrosis. She is using  oxygen continuously. Her EKG today is unchanged.   I saw [name] back today in the office. She was recently admitted for  Pneumonia superimposed on her pulmonary fibrosis. She had a small elevation in troponin which was thought to be due to demand ischemia. She underwent an echocardiogram which showed EF of 50-55%. No further treatment recommendations were suggested by cardiology consult. She was not started on aspirin or any antiplatelet therapy.  PMHx:  Past Medical History  Diagnosis Date  . COPD (chronic obstructive pulmonary disease) (HCC)   . Hyperlipemia   . Heart murmur   . Shortness of breath     with exertion  . Asthma   . Pneumonia     hx of frequent episodes of pneumonia  . Headache(784.0)     MIgraine- Visualchanges- flashing lights and cant focus and cant see.  Marland Kitchen. GERD (gastroesophageal reflux disease)   . Arthritis   . Diverticulitis 2008  . Pulmonary fibrosis (HCC)   . Hypertension   . Cirrhosis of liver not due to alcohol (HCC)   . Hypothyroidism   . On home O2     2L N/C    Past Surgical History  Procedure Laterality Date  . Eye surgery  05/2008    - bilateral  . Back surgery    . Total knee arthroplasty  11/2008  . Rotator cuff repair  2008    right  . Retinal tear repair cryotherapy    . Tonsillectomy    . Appendectomy    . Dilation and curettage of uterus    . Total knee arthroplasty  07/03/2011    Procedure: TOTAL KNEE ARTHROPLASTY;  Surgeon: Valeria BatmanPeter W Whitfield, MD;  [facility];  Service: Orthopedics;  Laterality: Right;  . Transthoracic echocardiogram  04/12/2011    EF>55%; mod concentric LVH; mild-mod TR  . Varicose vein surgery      FAMHx:  Family History  Problem Relation Age of Onset  . Multiple sclerosis Sister   . Diabetes Sister   . Heart disease Sister   . Heart attack Brother   . Stroke Maternal Grandmother   . Hypertension Sister   . Hyperlipidemia Sister   . Stroke Sister     SOCHx:   reports that she quit smoking about  67 years ago. She has never used smokeless tobacco. She reports that she does not drink alcohol or use illicit drugs.  ALLERGIES:  No Known Allergies  ROS: A comprehensive review of systems was negative except for: Constitutional: positive for fatigue Respiratory: positive for dyspnea on exertion  HOME MEDS: Current Outpatient Prescriptions  Medication Sig Dispense Refill  . albuterol (PROVENTIL HFA;VENTOLIN HFA) 108 (90 BASE) MCG/ACT inhaler Inhale 2 puffs into the lungs every 4 (four) hours as needed for wheezing or shortness of breath. For shortness of breath    . albuterol (PROVENTIL) (5 MG/ML) 0.5% nebulizer solution Take 0.5 mL (2.5 mg total) by nebulization every 6 (six) hours as needed for wheezing. (Patient taking differently: Take 2.5 mg by nebulization every 4 (four) hours as needed for wheezing or shortness of breath. ) 20 mL 12  . amLODipine (NORVASC) 2.5 MG tablet Take 2.5 mg by mouth every morning.     .Marland Kitchen  BYSTOLIC 10 MG tablet TAKE ONE TABLET BY MOUTH ONE TIME DAILY (Patient taking differently: TAKE ONE TABLET BY MOUTH EVERY MORNING) 30 tablet 6  . calcium carbonate (OS-CAL) 600 MG TABS tablet Take 600 mg by mouth daily with lunch.     . chlorpheniramine (CHLOR-TRIMETON) 4 MG tablet Take 4-8 mg by mouth at bedtime as needed (sleep).    Marland Kitchen dextromethorphan (DELSYM) 30 MG/5ML liquid Take 2 tsp twice a day as needed for cough    . diphenhydramine-acetaminophen (TYLENOL PM) 25-500 MG TABS Take 1-2 tablets by mouth at bedtime as needed (sleep).     . Glucosamine-Chondroitin (GLUCOSAMINE CHONDR COMPLEX PO) Take 1 tablet by mouth daily at 12 noon.     . hydrochlorothiazide (HYDRODIURIL) 25 MG tablet Take 25 mg by mouth every morning.    Marland Kitchen ipratropium (ATROVENT) 0.06 % nasal spray Place 2 sprays into both nostrils 4 (four) times daily. 15 mL 12  . irbesartan (AVAPRO) 300 MG tablet TAKE ONE TABLET BY MOUTH ONE TIME DAILY (Patient taking differently: TAKE ONE TABLET BY MOUTH EVERY MORNING)  30 tablet 7  . levothyroxine (SYNTHROID, LEVOTHROID) 88 MCG tablet Take 88 mcg by mouth daily before breakfast.    . Magnesium 250 MG TABS Take 1 tablet by mouth daily at 12 noon.    . metroNIDAZOLE (METROCREAM) 0.75 % cream Apply 1 application topically 2 (two) times daily as needed. To the face as directed  2  . OXYGEN 2 l/m 24/7    . pantoprazole (PROTONIX) 40 MG tablet Take 1 tablet (40 mg total) by mouth 2 (two) times daily. (Patient taking differently: Take 40 mg by mouth 2 (two) times daily before a meal. ) 60 tablet 4  . Polyethyl Glycol-Propyl Glycol (SYSTANE OP) Place 1 drop into both eyes every morning. Or as needed for dry eyes    . traMADol (ULTRAM) 50 MG tablet TAKE 1 TAB BY MOUTH EVERY 4 HOURS FOR PAIN OR COUGH. 60 tablet 0  . aspirin EC 81 MG tablet Take 1 tablet (81 mg total) by mouth daily. 30 tablet 0   No current facility-administered medications for this visit.    LABS/IMAGING: No results found for this or any previous visit (from the past 48 hour(s)). No results found.  VITALS: BP 130/60 mmHg  Pulse 69  Ht  (1.6 m)  Wt 118 lb 3.2 oz (53.615 kg)  BMI 20.94 kg/m2  SpO2 93%  EXAM:  General appearance: Awake, no apparent distress on oxygen Neck: Supple, no JVD Lungs: Decreased breath sounds with dry rales bilaterally Heart: Regular rate and rhythm no murmur Abdomen: Soft, scaphoid, nontender Extremities: No edema Pulses: 2+ Skin: Warm and dry Neurologic: Grossly nonfocal  EKG: Normal sinus rhythm at 67, left atrial enlargement,  Inferior and anteroseptal infarct changes, LVH by voltage  ASSESSMENT: 1. COPD and pulmonary fibrosis 2. HTN 3. Palpitations 4.  recent elevated troponin 5.  Low normal LVEF 50-55%  PLAN: 1.   Mrs. Rison recently was hospitalized with pneumonia and had elevated troponin which was thought due to demand ischemia. Echo showed EF of 50-55%. She denies any chest pain. At this point I would recommend at least that should be on  aspirin therapy. There is question of whether to put her on dual antiplatelet therapy , but for now I would recommend only aspirin.  I suspect she does have underlying coronary artery disease however she is a poor candidate for revascularization and certainly not a CABG candidate given her significant  pulmonary disease. Medical therapy would be recommended.  Chrystie Nose, MD, [facility] Attending Cardiologist [facility]  Chrystie Nose 12/07/2014, 5:23 PM

## 2015-01-29 ENCOUNTER — Other Ambulatory Visit: Payer: Self-pay | Admitting: Internal Medicine

## 2015-01-31 NOTE — Telephone Encounter (Signed)
Rx request sent to pharmacy.  

## 2015-02-01 ENCOUNTER — Encounter: Payer: Self-pay | Admitting: Internal Medicine

## 2015-02-01 ENCOUNTER — Ambulatory Visit (INDEPENDENT_AMBULATORY_CARE_PROVIDER_SITE_OTHER): Payer: Medicare Other | Admitting: Internal Medicine

## 2015-02-01 VITALS — BP 118/66 | HR 85 | Ht 62.5 in | Wt 119.0 lb

## 2015-02-01 DIAGNOSIS — J9611 Chronic respiratory failure with hypoxia: Secondary | ICD-10-CM

## 2015-02-01 DIAGNOSIS — R05 Cough: Secondary | ICD-10-CM | POA: Diagnosis not present

## 2015-02-01 DIAGNOSIS — R059 Cough, unspecified: Secondary | ICD-10-CM | POA: Insufficient documentation

## 2015-02-01 DIAGNOSIS — J8410 Pulmonary fibrosis, unspecified: Secondary | ICD-10-CM

## 2015-02-01 NOTE — Patient Instructions (Signed)
Please see patient coordinator before you leave today  to schedule Diagnostic esophagram   See calendar for specific medication instructions and bring it back for each and every office visit for every healthcare provider you see.  Without it,  you may not receive the best quality medical care that we feel you deserve.  You will note that the calendar groups together  your maintenance  medications that are timed at particular times of the day.  Think of this as your checklist for what your doctor has instructed you to do until your next evaluation to see what benefit  there is  to staying on a consistent group of medications intended to keep you well.  The other group at the bottom is entirely up to you to use as you see fit  for specific symptoms that may arise between visits that require you to treat them on an as needed basis.  Think of this as your action plan or "what if" list.   Separating the top medications from the bottom group is fundamental to providing you adequate care going forward.    Please schedule a follow up visit in 3 months but call sooner if needed to See Tammy with all meds and she'll generate a new med calendar

## 2015-02-01 NOTE — Progress Notes (Signed)
Subjective:     Patient ID: [name], female   DOB: 11-Jul-1927   MRN: [id number]    Brief patient profile:  88yowf never sign smoker followed by [name] remotely with h/o recurrent pna as infant and extending into adulthood as "freq bronchitis" then middle age seemed less freq, less severe and then wose x 2011/12 but never on 02 24/7 until p hosp for an illness that started in May 2014 c/w CAP/ ali/ pf    History of Present Illness  11/01/2014  f/u ov/[name] re: s/p ALI/ pf / 02 dep at hs  Chief Complaint  Patient presents with  . Follow-up    PFT done today. Pt states her breathing is unchanged. She rarely uses albuterol inhaler, but uses neb with albuterol almost daily.   doe = MMRC2 = can't walk a nl pace on a flat grade s sob rec Wear 02 2lpm at bedtime and with heavy exertion (though you may find with trial and error you do just as well off it because you don't have to carry it)    02/01/2015  f/u ov/Jashad Depaula re: PF / 02 prn ex Chief Complaint  Patient presents with  . Follow-up    Pt states her breathing is worse since her last visit.  She uses albuterol inhaler 2-3 x per wk.   MMRC3 = can't walk 100 yards even at a slow pace at a flat grade s stopping due to sob  Even on 02  No obvious day to day or daytime variability or assoc excess/ purulent sputum or mucus plugs   or  cp or chest tightness, subjective wheeze or overt sinus or hb symptoms.   New sense of dysphagia taking ppi at hs (not the instruction given or reflected on the med calendar   No unusual exp hx or h/o childhood pna/ asthma or knowledge of premature birth.  Sleeping ok without nocturnal  or early am exacerbation  of respiratory  c/o's or need for noct saba. Also denies any obvious fluctuation of symptoms with weather or environmental changes or other aggravating or alleviating factors except as outlined above   Current Medications, Allergies, Complete Past Medical History, Past Surgical History, Family  History, and Social History were reviewed in [name] record.  ROS  The following are not active complaints unless bolded sore throat, dysphagia, dental problems, itching, sneezing,  nasal congestion or excess/ purulent secretions, ear ache,   fever, chills, sweats, unintended wt loss, classically pleuritic or exertional cp, hemoptysis,  orthopnea pnd or leg swelling, presyncope, palpitations, abdominal pain, anorexia, nausea, vomiting, diarrhea  or change in bowel or bladder habits, change in stools or urine, dysuria,hematuria,  rash, arthralgias, visual complaints, headache, numbness, weakness or ataxia or problems with walking uses cane or coordination,  change in mood/affect or memory.                Objective:  amb somber wf nad on O2 , walks with cane    Vital signs reviewed  11/01/2014      117  > 02/01/2015     10/15/14 116 lb 12.8 oz (52.98 kg)  09/28/14 116 lb (52.617 kg)  09/28/14 116 lb (52.617 kg)    Vital signs reviewed   HEENT: nl dentition, turbinates, and orophanx. Nl external ear canals without cough reflex   NECK :  without JVD/Nodes/TM/ nl carotid upstrokes bilaterally   LUNGS: no acc muscle use,   Min isp pops/exp ronchi bases bilaterally  CV:  RRR  no s3 or murmur or increase in P2, tr edema   ABD:  soft and nontender with nl excursion in the supine position. No bruits or organomegaly, bowel sounds nl  MS:  warm without deformities, calf tenderness, cyanosis or clubbing  SKIN: warm and dry without lesions    NEURO:  alert, approp, no deficits     I personally reviewed images and agree with radiology impression as follows:  CXR:  10/12/14 Findings consistent with pulmonary fibrosis. No acute abnormality seen.                    Assessment:

## 2015-02-02 ENCOUNTER — Telehealth: Payer: Self-pay | Admitting: *Deleted

## 2015-02-02 ENCOUNTER — Ambulatory Visit (HOSPITAL_COMMUNITY)
Admission: RE | Admit: 2015-02-02 | Discharge: 2015-02-02 | Disposition: A | Discharge status: Home / Self Care | Payer: Medicare Other | Source: Ambulatory Visit | Attending: Internal Medicine | Admitting: Internal Medicine

## 2015-02-02 DIAGNOSIS — R059 Cough, unspecified: Secondary | ICD-10-CM

## 2015-02-02 DIAGNOSIS — R05 Cough: Secondary | ICD-10-CM | POA: Diagnosis not present

## 2015-02-02 DIAGNOSIS — K449 Diaphragmatic hernia without obstruction or gangrene: Secondary | ICD-10-CM | POA: Insufficient documentation

## 2015-02-02 NOTE — Telephone Encounter (Signed)
Spoke with the pt and notified of recs per MW  She verbalized understanding and denied any questions 

## 2015-02-02 NOTE — Assessment & Plan Note (Addendum)
Not adherent to gerd rx and ? If she's aspirating at all adding to her PF >  rec Dg Esophagram and ppi should be Take 30- 60 min before your first and last meals of the day   I had an extended discussion with the patient reviewing all relevant studies completed to date and  lasting 15 to 20 minutes of a 25 minute visit    Each maintenance medication was reviewed in detail including most importantly the difference between maintenance and prns and under what circumstances the prns are to be triggered using an action plan format that is not reflected in the computer generated alphabetically organized AVS but trather by a customized med calendar that reflects the AVS meds with confirmed 100% correlation.   Please see instructions for details which were reviewed in writing and the patient given a copy highlighting the part that I personally wrote and discussed at today's ov.

## 2015-02-02 NOTE — Telephone Encounter (Signed)
-----   Message from Nyoka Cowden, MD sent at 02/02/2015 10:57 AM EST ----- Make sure she understands Pantoprazole   should be Take 30- 60 min before your first and last meals of the day  (not at bedtime as she wrote on her med sheet

## 2015-02-02 NOTE — Assessment & Plan Note (Signed)
-  first noted on CT chest 02/13/2002  - off prednisone since around 09/08/2012  - 10/08/2012 PFTs VC  1.45 s obst and DLCO 56 corrects to 111 - 01/05/2013  PFTs VC  1.44 s obst dlco 62 correccts to 123  - 11/02/2013  PFT's VC  1.29 (59%) dlco 48 corrects to 97%  - PFT's  11/01/2014  FEV1 1.28 (57% % )  with DLCO  50 % corrects to 104 % for alv volume   - ESR 10/08/2012 = 59  - down to 45 11/19/12 and 42 12/291/4 > no change 04/29/13 - 11/02/2013  Walked RA  2 laps @ 185 ft each stopped due to  Legs gave out before breathing, no desats, slow pace   - 02/02/2014   Walked RA x one lap @ 185 stopped due to  desat to 85 % > see chronic resp failure   No evidence of pf worsening c/w prev ali/pf not UIP or other forms of PF > no change rx needed

## 2015-02-02 NOTE — Assessment & Plan Note (Signed)
-   09/05/12 reported amb off 02 with sats > 90 to mailbox and back  - 10/08/2012   Walked RA x 2 laps fast pace @ 185 stopped due to  Legs gave out with sats 94% - 11/19/2012  Walked RA  2 laps @ 185 ft each stopped due to  Weak legs - 04/29/2013  Walked RA  2 laps @ 185 ft each stopped due to fatigue, no sob or desat - 11/02/2013  Walked RA  2 laps @ 185 ft each stopped due to  Legs gave out before breathing, no desats, slow pace - 02/02/2014   Walked RA x one lap @ 185 stopped due to  desat to 85 % but no sob, slow pace  -  09/20/2014   Walked RA x one lap @ 185 stopped due to  Sob/ slow pace, no desat - 11/01/2014  Walked RA x 3 laps @ 185 ft each stopped due to End of study, slow pace pace, no sob or desat    - 02/01/2015  Walked RA x 2 laps @ 185 ft each stopped due to legs hurt, no desat, no sob  slow pace     rec 02/01/2015   02 2lpm at hs, not needed at rest or room to room, 2lpm prn exertion but probably not needed

## 2015-02-03 NOTE — Progress Notes (Signed)
Quick Note:  Spoke with pt and notified of results per Dr. Wert. Pt verbalized understanding and denied any questions.  ______ 

## 2015-03-10 ENCOUNTER — Other Ambulatory Visit: Payer: Self-pay | Admitting: Internal Medicine

## 2015-03-10 NOTE — Telephone Encounter (Signed)
REFILL 

## 2015-05-02 ENCOUNTER — Ambulatory Visit (INDEPENDENT_AMBULATORY_CARE_PROVIDER_SITE_OTHER): Payer: Medicare Other | Admitting: Adult Health

## 2015-05-02 ENCOUNTER — Encounter: Payer: Self-pay | Admitting: Adult Health

## 2015-05-02 VITALS — BP 152/74 | HR 70 | Temp 97.8°F | Ht 63.0 in | Wt 119.0 lb

## 2015-05-02 DIAGNOSIS — J8410 Pulmonary fibrosis, unspecified: Secondary | ICD-10-CM | POA: Diagnosis not present

## 2015-05-02 MED ORDER — PREDNISONE 10 MG PO TABS: ORAL_TABLET | ORAL | Status: DC

## 2015-05-02 NOTE — Progress Notes (Signed)
Subjective:     Patient ID: Diana GraffDorothy S Viglione, female   DOB: 1927-03-11   MRN: [id number]    Brief patient profile:  88yowf never sign smoker followed by [name] remotely with h/o recurrent pna as infant and extending into adulthood as "freq bronchitis" then middle age seemed less freq, less severe and then wose x 2011/12 but never on 02 24/7 until p hosp for an illness that started in May 2014 c/w CAP/ ali/ pf    History of Present Illness  11/01/2014  f/u ov/Wert re: s/p ALI/ pf / 02 dep at hs  Chief Complaint  Patient presents with  . Follow-up    PFT done today. Pt states her breathing is unchanged. She rarely uses albuterol inhaler, but uses neb with albuterol almost daily.   doe = MMRC2 = can't walk a nl pace on a flat grade s sob rec Wear 02 2lpm at bedtime and with heavy exertion (though you may find with trial and error you do just as well off it because you don't have to carry it)    02/01/2015  f/u ov/Wert re: PF / 02 prn ex Chief Complaint  Patient presents with  . Follow-up    Pt states her breathing is worse since her last visit.  She uses albuterol inhaler 2-3 x per wk.   MMRC3 = can't walk 100 yards even at a slow pace at a flat grade s stopping due to sob  Even on 02  No obvious day to day or daytime variability or assoc excess/ purulent sputum or mucus plugs   or  cp or chest tightness, subjective wheeze or overt sinus or hb symptoms.  New sense of dysphagia taking ppi at hs (not the instruction given or reflected on the med calendar >esophagram ordered   05/02/2015 Follow up : PF /O2 w/ act  Pt returns for 3 month follow up .  Complains that she has had ncreased SOB, wheezing, sinus congestion/drainage, and dry cough x 2 months. Worse for last week. Denies any fever, nausea or vomiting , Hemoptysis or edema Remains on O2 2l/m  , using more over last 3 months . Uses most of the day and night .  Barium swallow showed small hiatal hernia , showed normal swallow.  PVX and  Prevnar utd.  We reviewed all her meds and organized them into a med calendar .  Appears to be taking correctly. Daughter is with her and helps.    Current Medications, Allergies, Complete Past Medical History, Past Surgical History, Family History, and Social History were reviewed in [facility] electronic medical record.  ROS  The following are not active complaints unless bolded sore throat,  dental problems, itching, sneezing,  nasal congestion or excess/ purulent secretions, ear ache,   fever, chills, sweats, unintended wt loss, classically pleuritic or exertional cp, hemoptysis,  orthopnea pnd or leg swelling, presyncope, palpitations, abdominal pain, anorexia, nausea, vomiting, diarrhea  or change in bowel or bladder habits, change in stools or urine, dysuria,hematuria,  rash, arthralgias, visual complaints, headache, numbness, weakness or ataxia or problems with walking uses cane or coordination,  change in mood/affect or memory.                Objective:  amb somber wf nad on O2 , walks with cane    Vital signs reviewed Filed Vitals:   05/02/15 1415  BP: 152/74  Pulse: 70  Temp: 97.8 F (36.6 C)  TempSrc: Oral  Height: 5\' 3"  (1.6  m)  Weight: 119 lb (53.978 kg)  SpO2: 97%    11/01/2014      117  > 02/01/2015    Vital signs reviewed   HEENT: nl dentition, turbinates, and orophanx. Nl external ear canals without cough reflex   NECK :  without JVD/Nodes/TM/ nl carotid upstrokes bilaterally   LUNGS: no acc muscle use,   Min isp pops/exp ronchi bases bilaterally    CV:  RRR  no s3 or murmur or increase in P2, tr edema   ABD:  soft and nontender with nl excursion in the supine position. No bruits or organomegaly, bowel sounds nl  MS:  warm without deformities, calf tenderness, cyanosis or clubbing  SKIN: warm and dry without lesions    NEURO:  alert, approp, no deficits      CXR:  10/12/14 Findings consistent with pulmonary fibrosis. No acute  abnormality seen.                    Assessment:

## 2015-05-02 NOTE — Assessment & Plan Note (Signed)
Mild flare -tx w steroids as she says this has helped a lot in past Recent Barium swallow was nml .  Patient's medications were reviewed today and patient education was given. Computerized medication calendar was adjusted/completed   Plan  Follow med calendar closely and bring to each visit.  Prednisone taper over next week. And  As needed   Please contact office for sooner follow up if symptoms do not improve or worsen or seek emergency care  follow up Dr. Sherene SiresWert  In 3 months and As needed

## 2015-05-02 NOTE — Addendum Note (Signed)
Addended by: Karalee HeightOX, Lotus Gover P on: 05/02/2015 03:32 PM   Modules accepted: Orders, Medications

## 2015-05-02 NOTE — Progress Notes (Signed)
Chart and office note reviewed in detail  > agree with a/p as outlined    

## 2015-05-02 NOTE — Patient Instructions (Signed)
Follow med calendar closely and bring to each visit.  Prednisone taper over next week. And  As needed   Please contact office for sooner follow up if symptoms do not improve or worsen or seek emergency care  follow up Dr. Sherene SiresWert  In 3 months and As needed

## 2015-05-07 ENCOUNTER — Other Ambulatory Visit: Payer: Self-pay | Admitting: Internal Medicine

## 2015-05-11 ENCOUNTER — Other Ambulatory Visit: Payer: Self-pay | Admitting: Internal Medicine

## 2015-05-17 ENCOUNTER — Other Ambulatory Visit: Payer: Self-pay | Admitting: Family Medicine

## 2015-05-17 ENCOUNTER — Ambulatory Visit
Admission: RE | Admit: 2015-05-17 | Discharge: 2015-05-17 | Disposition: A | Discharge status: Home / Self Care | Payer: Medicare Other | Source: Ambulatory Visit | Attending: Family Medicine | Admitting: Family Medicine

## 2015-05-17 DIAGNOSIS — R05 Cough: Secondary | ICD-10-CM

## 2015-05-17 DIAGNOSIS — R059 Cough, unspecified: Secondary | ICD-10-CM

## 2015-05-27 ENCOUNTER — Encounter: Payer: Self-pay | Admitting: Internal Medicine

## 2015-05-27 ENCOUNTER — Ambulatory Visit (INDEPENDENT_AMBULATORY_CARE_PROVIDER_SITE_OTHER): Payer: Medicare Other | Admitting: Internal Medicine

## 2015-05-27 VITALS — BP 142/62 | HR 71 | Ht 63.25 in | Wt 118.4 lb

## 2015-05-27 DIAGNOSIS — R05 Cough: Secondary | ICD-10-CM

## 2015-05-27 DIAGNOSIS — J8410 Pulmonary fibrosis, unspecified: Secondary | ICD-10-CM

## 2015-05-27 DIAGNOSIS — R059 Cough, unspecified: Secondary | ICD-10-CM

## 2015-05-27 MED ORDER — PREDNISONE 10 MG PO TABS: ORAL_TABLET | ORAL | Status: DC

## 2015-05-27 MED ORDER — FAMOTIDINE 20 MG PO TABS: ORAL_TABLET | ORAL | Status: DC

## 2015-05-27 NOTE — Progress Notes (Signed)
Subjective:     Patient ID: [name], female   DOB: 08-25-1927   MRN: [id number]    Brief patient profile:  88yowf never sign smoker followed by [name] remotely with h/o recurrent pna as infant and extending into adulthood as "freq bronchitis" then middle age seemed less freq, less severe and then wose x 2011/12 but never on 02 24/7 until p hosp for an illness that started in May 2014 c/w CAP/ ali/ pf    History of Present Illness   05/02/2015 NP  Follow up : PF /O2 w/ act  Pt returns for 3 month follow up .  Complains that she has had ncreased SOB, wheezing, sinus congestion/drainage, and dry cough x 2 months. Worse for last week. Denies any fever, nausea or vomiting , Hemoptysis or edema Remains on O2 2l/m  , using more over last 3 months . Uses most of the day and night .  Barium swallow showed small hiatal hernia , showed normal swallow.  PVX and Prevnar utd.  We reviewed all her meds and organized them into a med calendar .  Appears to be taking correctly. Daughter is with her and helps.  rec Follow med calendar closely and bring to each visit.  Prednisone taper over next week. And  As needed      05/27/2015  f/u ov/Wert re: PF/ ? Ab component / pred always helps  Chief Complaint  Patient presents with  . Follow-up    coughing up pinkish Treese mucus, talking causes her to start coughing, scratchy throat, right ear pain   gradually worse since last pred complete with increase cough/ congestion and need for saba despite absence of airflow obst on pfts which are restrictive c/w PF Doe = MMRC3 = can't walk 100 yards even at a slow pace at a flat grade s stopping due to sob  Even on 2lpm whereas usually mmrc2  No obvious day to day or daytime variability or assoc  or cp or chest tightness, subjective wheeze or overt sinus or hb symptoms. No unusual exp hx or h/o childhood pna/ asthma or knowledge of premature birth.  Sleeping ok without nocturnal  or early am exacerbation  of  respiratory  c/o's or need for noct saba. Also denies any obvious fluctuation of symptoms with weather or environmental changes or other aggravating or alleviating factors except as outlined above   Current Medications, Allergies, Complete Past Medical History, Past Surgical History, Family History, and Social History were reviewed in [facility] electronic medical record.  ROS  The following are not active complaints unless bolded sore throat, dysphagia, dental problems, itching, sneezing,  nasal congestion or excess/ purulent secretions, ear ache,   fever, chills, sweats, unintended wt loss, classically pleuritic or exertional cp, hemoptysis,  orthopnea pnd or leg swelling, presyncope, palpitations, abdominal pain, anorexia, nausea, vomiting, diarrhea  or change in bowel or bladder habits, change in stools or urine, dysuria,hematuria,  rash, arthralgias, visual complaints, headache, numbness, weakness or ataxia or problems with walking or coordination,  change in mood/affect or memory.                 Objective:  amb somber wf nad on O2 via poc, walks with cane    Vital signs reviewed  - note sats 95% RA at rest    11/01/2014      117  >  05/27/2015   118    Vital signs reviewed   HEENT: nl dentition, turbinates, and orophanx. Nl  external ear canals without cough reflex   NECK :  without JVD/Nodes/TM/ nl carotid upstrokes bilaterally   LUNGS: no acc muscle use,   Insp squeaks pops  bases bilaterally and exp rhonchi as well    CV:  RRR  no s3 or murmur or increase in P2, tr edema   ABD:  soft and nontender with nl excursion in the supine position. No bruits or organomegaly, bowel sounds nl  MS:  warm without deformities, calf tenderness, cyanosis or clubbing  SKIN: warm and dry without lesions    NEURO:  alert, approp, no deficits         I personally reviewed images and agree with radiology impression as follows:  CXR:  05/17/15  Diffuse fibrotic changes without  definitive acute abnormality.                  Assessment:

## 2015-05-27 NOTE — Patient Instructions (Addendum)
Prednisone 10 mg 2 daily with breakfast until back to your normal self, then one daily until return  Add Pepcid ac 20 mg at bedtime   See Tammy NP  In 4-6  weeks with all your medications, even over the counter meds, separated in two separate bags, the ones you take no matter what vs the ones you stop once you feel better and take only as needed when you feel you need them.   Tammy  will generate for you a new user friendly medication calendar that will put us all on the same page re: your medication use.     Without this process, it simply isn't possible to assure that we are providing  your outpatient care  with  the attention to detail we feel you deserve.   If we cannot assure that you're getting that kind of care,  then we cannot manage your problem effectively from this clinic.  Once you have seen Tammy and we are sure that we're all on the same page with your medication use she will arrange follow up with me.  Add be sure using calcium gluconate not carbonate

## 2015-05-28 ENCOUNTER — Encounter: Payer: Self-pay | Admitting: Internal Medicine

## 2015-05-28 NOTE — Assessment & Plan Note (Signed)
Dg Esophagram 02/02/2015 > Small hiatal hernia.   Strongly suggestive not of ab (in setting of no obst on pfts while symptomatic) but rather  Classic Upper airway cough syndrome, so named because it's frequently impossible to sort out how much is  CR/sinusitis with freq throat clearing (which can be related to primary GERD)   vs  causing  secondary (" extra esophageal")  GERD from wide swings in gastric pressure that occur with throat clearing, often  promoting self use of mint and menthol lozenges that reduce the lower esophageal sphincter tone and exacerbate the problem further in a cyclical fashion.   These are the same pts (now being labeled as having "irritable larynx syndrome" by some cough centers) who not infrequently have a history of having failed to tolerate ace inhibitors,  dry powder inhalers or biphosphonates or report having atypical reflux symptoms that don't respond to standard doses of PPI , and are easily confused as having aecopd or asthma flares by even experienced allergists/ pulmonologists.    rec max gerd rx/diet/ add pepcid 20 mg at hs until return

## 2015-05-28 NOTE — Assessment & Plan Note (Signed)
-  first noted on CT chest 02/13/2002  - off prednisone since around 09/08/2012  - 10/08/2012 PFTs VC  1.45 s obst and DLCO 56 corrects to 111 - 01/05/2013  PFTs VC  1.44 s obst dlco 62 correccts to 123  - 11/02/2013  PFT's VC  1.29 (59%) dlco 48 corrects to 97%  - PFT's  11/01/2014  FEV1 1.28 (57% % )  with DLCO  50 % corrects to 104 % for alv volume   - ESR 10/08/2012 = 59  - down to 45 11/19/12 and 42 12/291/4 > no change 04/29/13 - 11/02/2013  Walked RA  2 laps @ 185 ft each stopped due to  Legs gave out before breathing, no desats, slow pace   - 02/02/2014   Walked RA x one lap @ 185 stopped due to  desat to 85 % > see chronic resp failure   Clearly was better on pred than off in retrospect reflected in mod elevation in esr but mostly by symptom control.  I had an extended discussion with the patient reviewing all relevant studies completed to date and  lasting 15 to 20 minutes of a 25 minute visit on the following ongoing concerns:  The goal with a chronic steroid dependent illness is always arriving at the lowest effective dose that controls the disease/symptoms and not accepting a set "formula" which is based on statistics or guidelines that don't always take into account patient  variability or the natural hx of the dz in every individual patient, which may well vary over time.  For now therefore I recommend the patient maintain  A ceiling of 20 and a floor of 10 mg daily for now.  Each maintenance medication was reviewed in detail including most importantly the difference between maintenance and as needed and under what circumstances the prns are to be used.  Please see instructions for details which were reviewed in writing and the patient given a copy.

## 2015-05-30 ENCOUNTER — Telehealth: Payer: Self-pay | Admitting: Internal Medicine

## 2015-05-30 MED ORDER — AMOXICILLIN-POT CLAVULANATE 875-125 MG PO TABS: 1.0000 | ORAL_TABLET | Freq: Two times a day (BID) | ORAL | Status: DC

## 2015-05-30 NOTE — Telephone Encounter (Signed)
Spoke with pt. States that she is now producing pink mucus when she coughs. When she saw MW on Friday her cough was producing Pfeffer mucus. She was started on a prednisone taper. Also reports SOB, chest tightness, wheezing. Denies fever. Would like MW's recommendations.  MW - please advise. Thanks.

## 2015-05-30 NOTE — Telephone Encounter (Signed)
Pt aware that Augmentin is being sent to [facility].  Aware of rec's per MW. Nothing further needed.

## 2015-05-30 NOTE — Telephone Encounter (Signed)
Augmentin 875 mg take one pill twice daily  X 10 days - take at breakfast and supper with large glass of water.  It would help reduce the usual side effects (diarrhea and yeast infections) if you ate cultured yogurt at lunch.  

## 2015-06-01 ENCOUNTER — Other Ambulatory Visit: Payer: Self-pay

## 2015-06-01 ENCOUNTER — Other Ambulatory Visit: Payer: Self-pay | Admitting: Internal Medicine

## 2015-06-01 MED ORDER — NEBIVOLOL HCL 10 MG PO TABS: 10.0000 mg | ORAL_TABLET | Freq: Every day | ORAL | Status: DC

## 2015-06-02 ENCOUNTER — Telehealth: Payer: Self-pay | Admitting: Internal Medicine

## 2015-06-02 NOTE — Telephone Encounter (Signed)
LMTCB

## 2015-06-03 ENCOUNTER — Emergency Department (HOSPITAL_COMMUNITY): Payer: Medicare Other

## 2015-06-03 ENCOUNTER — Encounter (HOSPITAL_COMMUNITY): Payer: Self-pay

## 2015-06-03 ENCOUNTER — Inpatient Hospital Stay (HOSPITAL_COMMUNITY)
Admission: EM | Admit: 2015-06-03 | Discharge: 2015-06-08 | DRG: 247 | Disposition: A | Discharge status: Home / Self Care | Payer: Medicare Other | Attending: Internal Medicine | Admitting: Internal Medicine

## 2015-06-03 DIAGNOSIS — J8410 Pulmonary fibrosis, unspecified: Secondary | ICD-10-CM | POA: Diagnosis present

## 2015-06-03 DIAGNOSIS — R7989 Other specified abnormal findings of blood chemistry: Secondary | ICD-10-CM | POA: Diagnosis present

## 2015-06-03 DIAGNOSIS — K5731 Diverticulosis of large intestine without perforation or abscess with bleeding: Secondary | ICD-10-CM

## 2015-06-03 DIAGNOSIS — R079 Chest pain, unspecified: Secondary | ICD-10-CM

## 2015-06-03 DIAGNOSIS — E039 Hypothyroidism, unspecified: Secondary | ICD-10-CM | POA: Diagnosis present

## 2015-06-03 DIAGNOSIS — K219 Gastro-esophageal reflux disease without esophagitis: Secondary | ICD-10-CM | POA: Diagnosis present

## 2015-06-03 DIAGNOSIS — R002 Palpitations: Secondary | ICD-10-CM

## 2015-06-03 DIAGNOSIS — R042 Hemoptysis: Secondary | ICD-10-CM

## 2015-06-03 DIAGNOSIS — I25119 Atherosclerotic heart disease of native coronary artery with unspecified angina pectoris: Secondary | ICD-10-CM | POA: Diagnosis not present

## 2015-06-03 DIAGNOSIS — I10 Essential (primary) hypertension: Secondary | ICD-10-CM | POA: Diagnosis present

## 2015-06-03 DIAGNOSIS — J9611 Chronic respiratory failure with hypoxia: Secondary | ICD-10-CM | POA: Diagnosis present

## 2015-06-03 DIAGNOSIS — J449 Chronic obstructive pulmonary disease, unspecified: Secondary | ICD-10-CM | POA: Diagnosis present

## 2015-06-03 DIAGNOSIS — Z96651 Presence of right artificial knee joint: Secondary | ICD-10-CM | POA: Diagnosis present

## 2015-06-03 DIAGNOSIS — R0600 Dyspnea, unspecified: Secondary | ICD-10-CM

## 2015-06-03 DIAGNOSIS — R778 Other specified abnormalities of plasma proteins: Secondary | ICD-10-CM

## 2015-06-03 DIAGNOSIS — I208 Other forms of angina pectoris: Secondary | ICD-10-CM | POA: Diagnosis present

## 2015-06-03 DIAGNOSIS — I110 Hypertensive heart disease with heart failure: Secondary | ICD-10-CM | POA: Diagnosis present

## 2015-06-03 DIAGNOSIS — Z7982 Long term (current) use of aspirin: Secondary | ICD-10-CM | POA: Diagnosis not present

## 2015-06-03 DIAGNOSIS — I214 Non-ST elevation (NSTEMI) myocardial infarction: Secondary | ICD-10-CM | POA: Diagnosis present

## 2015-06-03 DIAGNOSIS — E785 Hyperlipidemia, unspecified: Secondary | ICD-10-CM | POA: Diagnosis present

## 2015-06-03 DIAGNOSIS — J189 Pneumonia, unspecified organism: Secondary | ICD-10-CM

## 2015-06-03 DIAGNOSIS — I2089 Other forms of angina pectoris: Secondary | ICD-10-CM | POA: Diagnosis present

## 2015-06-03 DIAGNOSIS — R9389 Abnormal findings on diagnostic imaging of other specified body structures: Secondary | ICD-10-CM

## 2015-06-03 DIAGNOSIS — I2510 Atherosclerotic heart disease of native coronary artery without angina pectoris: Secondary | ICD-10-CM | POA: Diagnosis not present

## 2015-06-03 DIAGNOSIS — J310 Chronic rhinitis: Secondary | ICD-10-CM

## 2015-06-03 DIAGNOSIS — E43 Unspecified severe protein-calorie malnutrition: Secondary | ICD-10-CM

## 2015-06-03 DIAGNOSIS — R059 Cough, unspecified: Secondary | ICD-10-CM

## 2015-06-03 DIAGNOSIS — D62 Acute posthemorrhagic anemia: Secondary | ICD-10-CM

## 2015-06-03 DIAGNOSIS — K7460 Unspecified cirrhosis of liver: Secondary | ICD-10-CM

## 2015-06-03 DIAGNOSIS — Z9981 Dependence on supplemental oxygen: Secondary | ICD-10-CM

## 2015-06-03 DIAGNOSIS — R05 Cough: Secondary | ICD-10-CM

## 2015-06-03 DIAGNOSIS — E46 Unspecified protein-calorie malnutrition: Secondary | ICD-10-CM

## 2015-06-03 LAB — I-STAT CHEM 8, ED
BUN: 32 mg/dL — AB (ref 6–20)
CALCIUM ION: 1.13 mmol/L (ref 1.13–1.30)
CREATININE: 1.1 mg/dL — AB (ref 0.44–1.00)
Chloride: 98 mmol/L — ABNORMAL LOW (ref 101–111)
Glucose, Bld: 156 mg/dL — ABNORMAL HIGH (ref 65–99)
HEMATOCRIT: 39 % (ref 36.0–46.0)
Hemoglobin: 13.3 g/dL (ref 12.0–15.0)
Potassium: 4.7 mmol/L (ref 3.5–5.1)
Sodium: 138 mmol/L (ref 135–145)
TCO2: 28 mmol/L (ref 0–100)

## 2015-06-03 LAB — CBC WITH DIFFERENTIAL/PLATELET
BASOS PCT: 0 %
Basophils Absolute: 0 10*3/uL (ref 0.0–0.1)
EOS ABS: 0 10*3/uL (ref 0.0–0.7)
EOS PCT: 0 %
HCT: 37.9 % (ref 36.0–46.0)
Hemoglobin: 11.8 g/dL — ABNORMAL LOW (ref 12.0–15.0)
Lymphocytes Relative: 6 %
Lymphs Abs: 0.6 10*3/uL — ABNORMAL LOW (ref 0.7–4.0)
MCH: 28.5 pg (ref 26.0–34.0)
MCHC: 31.1 g/dL (ref 30.0–36.0)
MCV: 91.5 fL (ref 78.0–100.0)
MONO ABS: 0.2 10*3/uL (ref 0.1–1.0)
MONOS PCT: 2 %
NEUTROS PCT: 92 %
Neutro Abs: 10 10*3/uL — ABNORMAL HIGH (ref 1.7–7.7)
PLATELETS: 291 10*3/uL (ref 150–400)
RBC: 4.14 MIL/uL (ref 3.87–5.11)
RDW: 14.1 % (ref 11.5–15.5)
WBC: 10.8 10*3/uL — ABNORMAL HIGH (ref 4.0–10.5)

## 2015-06-03 LAB — TSH: TSH: 0.841 u[IU]/mL (ref 0.350–4.500)

## 2015-06-03 LAB — HEPATIC FUNCTION PANEL
ALBUMIN: 3.7 g/dL (ref 3.5–5.0)
ALK PHOS: 66 U/L (ref 38–126)
ALT: 18 U/L (ref 14–54)
AST: 29 U/L (ref 15–41)
BILIRUBIN TOTAL: 0.8 mg/dL (ref 0.3–1.2)
Bilirubin, Direct: 0.1 mg/dL (ref 0.1–0.5)
Indirect Bilirubin: 0.7 mg/dL (ref 0.3–0.9)
Total Protein: 7 g/dL (ref 6.5–8.1)

## 2015-06-03 LAB — I-STAT TROPONIN, ED: TROPONIN I, POC: 1.03 ng/mL — AB (ref 0.00–0.08)

## 2015-06-03 LAB — BRAIN NATRIURETIC PEPTIDE: B NATRIURETIC PEPTIDE 5: 1294 pg/mL — AB (ref 0.0–100.0)

## 2015-06-03 LAB — TROPONIN I
Troponin I: 1.67 ng/mL (ref <0.031)
Troponin I: 2.4 ng/mL (ref <0.031)

## 2015-06-03 LAB — T4, FREE: FREE T4: 1.16 ng/dL — AB (ref 0.61–1.12)

## 2015-06-03 LAB — MAGNESIUM: Magnesium: 2 mg/dL (ref 1.7–2.4)

## 2015-06-03 LAB — MRSA PCR SCREENING: MRSA by PCR: NEGATIVE

## 2015-06-03 LAB — PROTIME-INR
INR: 1.08 (ref 0.00–1.49)
PROTHROMBIN TIME: 14.2 s (ref 11.6–15.2)

## 2015-06-03 MED ORDER — AMOXICILLIN-POT CLAVULANATE 875-125 MG PO TABS
1.0000 | ORAL_TABLET | Freq: Two times a day (BID) | ORAL | Status: DC
Start: 2015-06-03 — End: 2015-06-07
  Administered 2015-06-03 – 2015-06-07 (×8): 1 via ORAL
  Filled 2015-06-03 (×8): qty 1

## 2015-06-03 MED ORDER — HYDROCHLOROTHIAZIDE 25 MG PO TABS
25.0000 mg | ORAL_TABLET | Freq: Every morning | ORAL | Status: DC
  Administered 2015-06-04 – 2015-06-08 (×5): 25 mg via ORAL
  Filled 2015-06-03 (×5): qty 1

## 2015-06-03 MED ORDER — FAMOTIDINE 20 MG PO TABS
20.0000 mg | ORAL_TABLET | Freq: Every day | ORAL | Status: DC
  Administered 2015-06-03 – 2015-06-08 (×6): 20 mg via ORAL
  Filled 2015-06-03 (×6): qty 1

## 2015-06-03 MED ORDER — ASPIRIN EC 81 MG PO TBEC
81.0000 mg | DELAYED_RELEASE_TABLET | Freq: Every day | ORAL | Status: DC
  Administered 2015-06-04 – 2015-06-08 (×5): 81 mg via ORAL
  Filled 2015-06-03 (×5): qty 1

## 2015-06-03 MED ORDER — HEPARIN BOLUS VIA INFUSION
3000.0000 [IU] | Freq: Once | INTRAVENOUS | Status: AC
  Administered 2015-06-03: 3000 [IU] via INTRAVENOUS
  Filled 2015-06-03: qty 3000

## 2015-06-03 MED ORDER — ALBUTEROL SULFATE (2.5 MG/3ML) 0.083% IN NEBU: 2.5000 mg | INHALATION_SOLUTION | RESPIRATORY_TRACT | Status: DC | PRN

## 2015-06-03 MED ORDER — ASPIRIN EC 81 MG PO TBEC: 81.0000 mg | DELAYED_RELEASE_TABLET | Freq: Every day | ORAL | Status: DC

## 2015-06-03 MED ORDER — METHOCARBAMOL 500 MG PO TABS
500.0000 mg | ORAL_TABLET | Freq: Two times a day (BID) | ORAL | Status: DC
  Administered 2015-06-03 – 2015-06-08 (×10): 500 mg via ORAL
  Filled 2015-06-03 (×10): qty 1

## 2015-06-03 MED ORDER — SODIUM CHLORIDE 0.9 % IV SOLN
INTRAVENOUS | Status: DC
  Administered 2015-06-03: 10 mL/h via INTRAVENOUS

## 2015-06-03 MED ORDER — ASPIRIN 81 MG PO CHEW: 81.0000 mg | CHEWABLE_TABLET | ORAL | Status: DC

## 2015-06-03 MED ORDER — ALPRAZOLAM 0.25 MG PO TABS
0.2500 mg | ORAL_TABLET | Freq: Two times a day (BID) | ORAL | Status: DC | PRN
  Administered 2015-06-04: 0.25 mg via ORAL
  Filled 2015-06-03: qty 1

## 2015-06-03 MED ORDER — ASPIRIN 81 MG PO CHEW: 324.0000 mg | CHEWABLE_TABLET | ORAL | Status: AC

## 2015-06-03 MED ORDER — LEVOTHYROXINE SODIUM 88 MCG PO TABS
88.0000 ug | ORAL_TABLET | Freq: Every day | ORAL | Status: DC
  Administered 2015-06-04 – 2015-06-08 (×5): 88 ug via ORAL
  Filled 2015-06-03 (×6): qty 1

## 2015-06-03 MED ORDER — MAGNESIUM OXIDE 400 (241.3 MG) MG PO TABS
200.0000 mg | ORAL_TABLET | Freq: Every day | ORAL | Status: DC
  Administered 2015-06-04 – 2015-06-07 (×3): 200 mg via ORAL
  Filled 2015-06-03 (×3): qty 1

## 2015-06-03 MED ORDER — NITROGLYCERIN 2 % TD OINT
0.5000 [in_us] | TOPICAL_OINTMENT | Freq: Four times a day (QID) | TRANSDERMAL | Status: DC
  Administered 2015-06-04 (×2): 0.5 [in_us] via TOPICAL
  Filled 2015-06-03: qty 30

## 2015-06-03 MED ORDER — SODIUM CHLORIDE 0.9 % WEIGHT BASED INFUSION: 3.0000 mL/kg/h | INTRAVENOUS | Status: DC

## 2015-06-03 MED ORDER — ZOLPIDEM TARTRATE 5 MG PO TABS
5.0000 mg | ORAL_TABLET | Freq: Every evening | ORAL | Status: DC | PRN
  Administered 2015-06-03: 5 mg via ORAL
  Filled 2015-06-03: qty 1

## 2015-06-03 MED ORDER — TRAMADOL HCL 50 MG PO TABS
50.0000 mg | ORAL_TABLET | Freq: Four times a day (QID) | ORAL | Status: DC | PRN
  Administered 2015-06-05: 50 mg via ORAL
  Filled 2015-06-03: qty 1

## 2015-06-03 MED ORDER — ASPIRIN 300 MG RE SUPP: 300.0000 mg | RECTAL | Status: AC

## 2015-06-03 MED ORDER — DIPHENHYDRAMINE-APAP (SLEEP) 25-500 MG PO TABS: 1.0000 | ORAL_TABLET | Freq: Every evening | ORAL | Status: DC | PRN

## 2015-06-03 MED ORDER — SODIUM CHLORIDE 0.9 % IV SOLN: 250.0000 mL | INTRAVENOUS | Status: DC | PRN

## 2015-06-03 MED ORDER — ACETAMINOPHEN 325 MG PO TABS
650.0000 mg | ORAL_TABLET | ORAL | Status: DC | PRN
  Administered 2015-06-05: 650 mg via ORAL
  Filled 2015-06-03: qty 2

## 2015-06-03 MED ORDER — NITROGLYCERIN 0.4 MG SL SUBL: 0.4000 mg | SUBLINGUAL_TABLET | SUBLINGUAL | Status: DC | PRN

## 2015-06-03 MED ORDER — ONDANSETRON HCL 4 MG/2ML IJ SOLN
4.0000 mg | Freq: Four times a day (QID) | INTRAMUSCULAR | Status: DC | PRN
  Administered 2015-06-05: 4 mg via INTRAVENOUS
  Filled 2015-06-03: qty 2

## 2015-06-03 MED ORDER — ENSURE ENLIVE PO LIQD: 237.0000 mL | Freq: Two times a day (BID) | ORAL | Status: DC

## 2015-06-03 MED ORDER — HEPARIN (PORCINE) IN NACL 100-0.45 UNIT/ML-% IJ SOLN
600.0000 [IU]/h | INTRAMUSCULAR | Status: DC
  Administered 2015-06-03 – 2015-06-05 (×2): 600 [IU]/h via INTRAVENOUS
  Filled 2015-06-03 (×2): qty 250

## 2015-06-03 MED ORDER — SODIUM CHLORIDE 0.9 % WEIGHT BASED INFUSION: 1.0000 mL/kg/h | INTRAVENOUS | Status: DC

## 2015-06-03 MED ORDER — SODIUM CHLORIDE 0.9% FLUSH: 3.0000 mL | INTRAVENOUS | Status: DC | PRN

## 2015-06-03 MED ORDER — PANTOPRAZOLE SODIUM 40 MG PO TBEC
40.0000 mg | DELAYED_RELEASE_TABLET | Freq: Two times a day (BID) | ORAL | Status: DC
  Administered 2015-06-03 – 2015-06-08 (×10): 40 mg via ORAL
  Filled 2015-06-03 (×10): qty 1

## 2015-06-03 MED ORDER — IRBESARTAN 300 MG PO TABS
300.0000 mg | ORAL_TABLET | Freq: Every morning | ORAL | Status: DC
  Administered 2015-06-04 – 2015-06-08 (×5): 300 mg via ORAL
  Filled 2015-06-03 (×5): qty 1

## 2015-06-03 MED ORDER — AMLODIPINE BESYLATE 5 MG PO TABS
2.5000 mg | ORAL_TABLET | Freq: Every morning | ORAL | Status: DC
  Administered 2015-06-04 – 2015-06-08 (×5): 2.5 mg via ORAL
  Filled 2015-06-03 (×5): qty 1

## 2015-06-03 MED ORDER — SODIUM CHLORIDE 0.9% FLUSH
3.0000 mL | Freq: Two times a day (BID) | INTRAVENOUS | Status: DC
  Administered 2015-06-03 – 2015-06-04 (×2): 3 mL via INTRAVENOUS

## 2015-06-03 MED ORDER — ATORVASTATIN CALCIUM 40 MG PO TABS
40.0000 mg | ORAL_TABLET | Freq: Every day | ORAL | Status: DC
  Administered 2015-06-04 – 2015-06-07 (×4): 40 mg via ORAL
  Filled 2015-06-03 (×4): qty 1

## 2015-06-03 MED ORDER — PREDNISONE 10 MG PO TABS
10.0000 mg | ORAL_TABLET | Freq: Every day | ORAL | Status: DC
  Administered 2015-06-04 – 2015-06-07 (×4): 10 mg via ORAL
  Filled 2015-06-03 (×3): qty 1

## 2015-06-03 MED ORDER — NEBIVOLOL HCL 5 MG PO TABS
10.0000 mg | ORAL_TABLET | Freq: Every day | ORAL | Status: DC
  Administered 2015-06-04 – 2015-06-08 (×5): 10 mg via ORAL
  Filled 2015-06-03 (×5): qty 2

## 2015-06-03 MED ORDER — DEXTROMETHORPHAN POLISTIREX ER 30 MG/5ML PO SUER
30.0000 mg | Freq: Two times a day (BID) | ORAL | Status: DC | PRN
Start: 2015-06-03 — End: 2015-06-08
  Filled 2015-06-03: qty 5

## 2015-06-03 NOTE — Progress Notes (Signed)
ANTICOAGULATION CONSULT NOTE - Initial Consult  Pharmacy Consult for heparin Indication: chest pain/ACS  No Known Allergies  Patient Measurements: Height: 5' 3.25" (160.7 cm) Weight: 118 lb 6.2 oz (53.7 kg) IBW/kg (Calculated) : 52.98 Heparin Dosing Weight: 53.7  Vital Signs: Temp: 98.1 F (36.7 C) (05/26 1356) Temp Source: Oral (05/26 1356) BP: 137/69 mmHg (05/26 1500) Pulse Rate: 64 (05/26 1500)  Labs:  Recent Labs  06/03/15 1438 06/03/15 1440  HGB 11.8* 13.3  HCT 37.9 39.0  PLT 291  --   CREATININE  --  1.10*    Estimated Creatinine Clearance: 30.7 mL/min (by C-G formula based on Cr of 1.1).   Medical History: Past Medical History  Diagnosis Date  . COPD (chronic obstructive pulmonary disease) (HCC)   . Hyperlipemia   . Heart murmur   . Shortness of breath     with exertion  . Asthma   . Pneumonia     hx of frequent episodes of pneumonia  . Headache(784.0)     MIgraine- Visualchanges- flashing lights and cant focus and cant see.  Marland Kitchen. GERD (gastroesophageal reflux disease)   . Arthritis   . Diverticulitis 2008  . Pulmonary fibrosis (HCC)   . Hypertension   . Cirrhosis of liver not due to alcohol (HCC)   . Hypothyroidism   . On home O2     2L N/C    Medications:  Scheduled:  . heparin  3,000 Units Intravenous Once  . nitroGLYCERIN  0.5 inch Topical Q6H    Assessment: 82 yo woman admitted 06/03/2015 with radiating CP, cough and inc SOB. Trop 1.03. Pharmacy consulted to dose heparin.  PMH HTN, hypothyroidism, HLD, pulmonary fibrosis, GERD, COPD, arthritis  No anticoagulants prior to admission. Hgb 13.3, plt wnl.   Goal of Therapy:  Heparin level 0.3-0.7 units/ml Monitor platelets by anticoagulation protocol: Yes   Plan:  Heparin 3000 units x 1 Heparin 600 units/h 0030 HL Daily HL, CBC Monitor s/sx bleeding   Hillery AldoElizabeth Lorrain Rivers, Pharm.D., BCPS PGY2 Cardiology Pharmacy Resident Pager: [phone] 06/03/2015,4:17 PM

## 2015-06-03 NOTE — ED Notes (Signed)
Myself and Lane HackerHarley, EMT physically moved patient from the hallway to room A5; undressed patient, placed in gown, on monitor, continuous pulse oximetry and blood pressure

## 2015-06-03 NOTE — ED Notes (Signed)
Pt moved into room from hallway at the bedside

## 2015-06-03 NOTE — Telephone Encounter (Signed)
lmtcb x1 for pt. 

## 2015-06-03 NOTE — ED Notes (Signed)
Per EMS, Pt is coming from home. Pt started to have chest pressure last night 8/10. Pt reports being able to sleep and then waking up this morning to 1/10 pressure. Pt took Aspirin and then reports no pain at this time. Denies vomiting, diarrhea, SOB, dizziness or lightheadedness. Reports some nausea last night. When EMS arrived, pt denies pain. Hx of Pulmonary Fibrosis. Pt has been on steroids for five days for cough and breathing. Vitals per EMS: 154/86, 70 HR, 18 RR, 98% on RA,

## 2015-06-03 NOTE — ED Provider Notes (Addendum)
CSN: [id number]     Arrival date & time 06/03/15  1349 History   First MD Initiated Contact with Patient 06/03/15 1405     Chief Complaint  Patient presents with  . Chest Pain      Patient is a 82 y.o. female presenting with chest pain. The history is provided by the patient.  Chest Pain Associated symptoms: shortness of breath   Associated symptoms: no abdominal pain, no back pain, no headache, no nausea, no numbness, not vomiting and no weakness   Patient presents with chest pain. States it started last night. It was a feeling of fullness in her chest that went up to her neck into her left arm. Could not find a position of comfort with it. States she was still having somewhat this morning show difficulty sleeping with it. Feeling much better now. No diaphoresis. No nausea. No real shortness of breath. No swelling or legs. No difficulty with walking today will she does not normally walk much. Her cardiologist is Dr. Rennis Golden. Has a history of pulmonary fibrosis.  Past Medical History  Diagnosis Date  . COPD (chronic obstructive pulmonary disease) (HCC)   . Hyperlipemia   . Heart murmur   . Shortness of breath     with exertion  . Asthma   . Pneumonia     hx of frequent episodes of pneumonia  . Headache(784.0)     MIgraine- Visualchanges- flashing lights and cant focus and cant see.  Marland Kitchen GERD (gastroesophageal reflux disease)   . Arthritis   . Diverticulitis 2008  . Pulmonary fibrosis (HCC)   . Hypertension   . Cirrhosis of liver not due to alcohol (HCC)   . Hypothyroidism   . On home O2     2L N/C   Past Surgical History  Procedure Laterality Date  . Eye surgery  05/2008    - bilateral  . Back surgery    . Total knee arthroplasty  11/2008  . Rotator cuff repair  2008    right  . Retinal tear repair cryotherapy    . Tonsillectomy    . Appendectomy    . Dilation and curettage of uterus    . Total knee arthroplasty  07/03/2011    Procedure: TOTAL KNEE ARTHROPLASTY;  Surgeon:  Valeria Batman, MD;  Location: [facility] OR;  Service: Orthopedics;  Laterality: Right;  . Transthoracic echocardiogram  04/12/2011    EF>55%; mod concentric LVH; mild-mod TR  . Varicose vein surgery     Family History  Problem Relation Age of Onset  . Multiple sclerosis Sister   . Diabetes Sister   . Heart disease Sister   . Heart attack Brother   . Stroke Maternal Grandmother   . Hypertension Sister   . Hyperlipidemia Sister   . Stroke Sister    Social History  Substance Use Topics  . Smoking status: Former Smoker -- 1.5 years    Quit date: 01/09/1947  . Smokeless tobacco: Never Used     Comment: socially smoed x 1 1/2 years 04/29/13  . Alcohol Use: No   OB History    No data available     Review of Systems  Constitutional: Negative for activity change and appetite change.  Eyes: Negative for pain.  Respiratory: Positive for shortness of breath. Negative for chest tightness.   Cardiovascular: Positive for chest pain. Negative for leg swelling.  Gastrointestinal: Negative for nausea, vomiting, abdominal pain and diarrhea.  Genitourinary: Negative for flank pain.  Musculoskeletal: Negative for  back pain and neck stiffness.  Skin: Negative for rash.  Neurological: Negative for weakness, numbness and headaches.  Psychiatric/Behavioral: Negative for behavioral problems.      Allergies  Review of patient's allergies indicates no known allergies.  Home Medications   Prior to Admission medications   Medication Sig Start Date End Date Taking? Authorizing Provider  albuterol (PROVENTIL HFA;VENTOLIN HFA) 108 (90 BASE) MCG/ACT inhaler Inhale 2 puffs into the lungs every 4 (four) hours as needed for wheezing or shortness of breath. For shortness of breath    Historical Provider, MD  albuterol (PROVENTIL) (5 MG/ML) 0.5% nebulizer solution Take 0.5 mL (2.5 mg total) by nebulization every 6 (six) hours as needed for wheezing. Patient taking differently: Take 2.5 mg by nebulization  every 4 (four) hours as needed for wheezing or shortness of breath.  08/25/12   Tora KindredMarianne L York, PA-C  amLODipine (NORVASC) 2.5 MG tablet Take 2.5 mg by mouth every morning.     Historical Provider, MD  amoxicillin-clavulanate (AUGMENTIN) 875-125 MG tablet Take 1 tablet by mouth 2 (two) times daily. 05/30/15   Nyoka CowdenMichael B Wert, MD  aspirin EC 81 MG tablet Take 1 tablet (81 mg total) by mouth daily. Patient taking differently: Take 81 mg by mouth every morning.  12/07/14   Chrystie NoseKenneth C Hilty, MD  chlorpheniramine (CHLOR-TRIMETON) 4 MG tablet Take 4 mg by mouth at bedtime.    Historical Provider, MD  dextromethorphan (DELSYM) 30 MG/5ML liquid Take 2 tsp twice a day as needed for cough    Historical Provider, MD  diphenhydramine-acetaminophen (TYLENOL PM) 25-500 MG TABS Take 1-2 tablets by mouth at bedtime as needed (sleep).     Historical Provider, MD  famotidine (PEPCID) 20 MG tablet One at bedtime 05/27/15   Nyoka CowdenMichael B Wert, MD  Glucosamine-Chondroitin (GLUCOSAMINE CHONDR COMPLEX PO) Take 1 tablet by mouth daily at 12 noon.     Historical Provider, MD  hydrochlorothiazide (HYDRODIURIL) 25 MG tablet Take 25 mg by mouth every morning.    Historical Provider, MD  ipratropium (ATROVENT) 0.06 % nasal spray Place 2 sprays into both nostrils 4 (four) times daily. Patient taking differently: Take 2 puffs four times daily as needed for nasal drip 11/02/14   Nyoka CowdenMichael B Wert, MD  irbesartan (AVAPRO) 300 MG tablet TAKE ONE TABLET BY MOUTH ONE TIME DAILY Patient taking differently: TAKE ONE TABLET BY MOUTH EVERY MORNING 03/10/15   Chrystie NoseKenneth C Hilty, MD  levothyroxine (SYNTHROID, LEVOTHROID) 88 MCG tablet Take 88 mcg by mouth daily before breakfast.    Historical Provider, MD  Magnesium 250 MG TABS Take 1 tablet by mouth daily at 12 noon.    Historical Provider, MD  methocarbamol (ROBAXIN) 500 MG tablet Take 500 mg by mouth 2 (two) times daily.    Historical Provider, MD  MetroNIDAZOLE (FLAGYL PO) Take by mouth. Apply to face  as needed for rash    Historical Provider, MD  metroNIDAZOLE (METROCREAM) 0.75 % cream Apply 1 application topically 2 (two) times daily as needed. To the face as directed 07/23/14   Historical Provider, MD  naproxen sodium (ANAPROX) 220 MG tablet Take per bottle as needed for joint pain    Historical Provider, MD  nebivolol (BYSTOLIC) 10 MG tablet Take 1 tablet (10 mg total) by mouth daily. 06/01/15   Chrystie NoseKenneth C Hilty, MD  OXYGEN 2 l/m with sleep and exertion    Historical Provider, MD  pantoprazole (PROTONIX) 40 MG tablet Take 1 tablet (40 mg total) by mouth 2 (two) times  daily. Patient taking differently: Take 40 mg by mouth 2 (two) times daily before a meal.  08/20/13   Ripudeep Jenna Luo, MD  predniSONE (DELTASONE) 10 MG tablet Take  2 each am until better then 1 daily 05/27/15   Nyoka Cowden, MD  traMADol (ULTRAM) 50 MG tablet TAKE 1 TABLET BY MOUTH EVERY 4 HOURS FOR PAIN OR COUGH 05/09/15   Nyoka Cowden, MD   BP 149/61 mmHg  Pulse 68  Temp(Src) 98.1 F (36.7 C) (Oral)  Resp 16  SpO2 97% Physical Exam  Constitutional: She is oriented to person, place, and time. She appears well-developed and well-nourished.  HENT:  Head: Normocephalic and atraumatic.  Eyes: Pupils are equal, round, and reactive to light.  Neck: Normal range of motion. No JVD present.  Cardiovascular: Normal rate, regular rhythm and normal heart sounds.   No murmur heard. Pulmonary/Chest: Effort normal.  Diffuse harsh breath sounds with diffuse rales.  Abdominal: Soft. She exhibits no distension. There is no tenderness. There is no rebound and no guarding.  Musculoskeletal: Normal range of motion.  Neurological: She is alert and oriented to person, place, and time. No cranial nerve deficit.  Skin: Skin is warm.  Psychiatric: Her speech is normal.  Nursing note and vitals reviewed.   ED Course  Procedures (including critical care time) Labs Review Labs Reviewed  I-STAT TROPOININ, ED - Abnormal; Notable for the  following:    Troponin i, poc 1.03 (*)    All other components within normal limits  I-STAT CHEM 8, ED - Abnormal; Notable for the following:    Chloride 98 (*)    BUN 32 (*)    Creatinine, Ser 1.10 (*)    Glucose, Bld 156 (*)    All other components within normal limits  CBC WITH DIFFERENTIAL/PLATELET    Imaging Review No results found. I have personally reviewed and evaluated these images and lab results as part of my medical decision-making.   EKG Interpretation   Date/Time:  Friday Jun 03 2015 13:59:41 EDT Ventricular Rate:  70 PR Interval:  177 QRS Duration: 127 QT Interval:  439 QTC Calculation: 474 R Axis:   -70 Text Interpretation:  Sinus rhythm Ventricular premature complex Left  atrial enlargement Left bundle branch block Confirmed by Anneth Brunell  MD,  Harrold Donath [phone]) on 06/03/2015 2:15:40 PM      MDM   Final diagnoses:  Chest pain, unspecified chest pain type    Patient with chest pain. EKG reassuring. Somewhat risky story. Likely require admission. Previous CTA reviewed and no thoracic aneurysm or dissection at that time, about 6 months ago.    Benjiman Core, MD 06/03/15 1450  Troponin elevated. No current chest pain. Discussed with cardiology, who will see the patient.  Benjiman Core, MD 06/03/15 [phone]

## 2015-06-03 NOTE — Telephone Encounter (Signed)
Patient called and states that she has gotten worse and would like a call back -prm

## 2015-06-03 NOTE — H&P (Signed)
[name] is an 82 y.o. female.    Primary Cardiologist:Dr. Rennis Golden  PCP: Darrow Bussing, MD  Chief Complaint: chest pain began last pm. HPI: 82 year-old female with a history of knee surgery on the right, low back surgery, hypertension, hypothyroidism, mild dyslipidemia, pulmonary fibrosis and GERD.   Had an echo 09/30/14 which showed a normal EF, but has had right lower extremity swelling. She reports she thought she had prior stripping to the left greater saphenous vein graft in the past. She underwent arterial and venous Dopplers of the legs on December 11, 2011 which showed a slightly reduced ABI on the right with an increased velocity at the popliteal artery suggesting 50% reduction, however, normal ABI on the left. The venous Dopplers indicated no evidence of venous insufficiency and all of the visualized vessels were present, including both the left and right greater saphenous veins, suggesting that if they had performed prior venous stripping that perhaps she had an accessory vessel removed but it was not the greater saphenous vein. Overall there is no clear evidence to support venous ablation or other treatment.   nuc study 2005 with no ischemia.  Over last 2 weeks has not felt just right -she saw Dr. Sherene Sires 05/27/15 for cough, inc SOB, wheezing.  Was placed on prednisone taper.  Could not walk far without SOB.  But last PM developed significant SOB and chest pain mid-sternal and into both sides of neck and into Lt medial arm.  She sat up most of the night and did not rest much.  Today it continued EMS was called after she called [facility] office.  Other than pressure and SOB she had some nausea.  Currently feeling better. She is on home oxygen.   Troponin 1.03,  EKG with LBBB and Lat. T wave changes.  CXR pul. Fibrosis, cardiomegaly no acute changes.   Past Medical History  Diagnosis Date  . COPD (chronic obstructive pulmonary disease) (HCC)   . Hyperlipemia   . Heart murmur     . Shortness of breath     with exertion  . Asthma   . Pneumonia     hx of frequent episodes of pneumonia  . Headache(784.0)     MIgraine- Visualchanges- flashing lights and cant focus and cant see.  Marland Kitchen GERD (gastroesophageal reflux disease)   . Arthritis   . Diverticulitis 2008  . Pulmonary fibrosis (HCC)   . Hypertension   . Cirrhosis of liver not due to alcohol (HCC)   . Hypothyroidism   . On home O2     2L N/C    Past Surgical History  Procedure Laterality Date  . Eye surgery  05/2008    - bilateral  . Back surgery    . Total knee arthroplasty  11/2008  . Rotator cuff repair  2008    right  . Retinal tear repair cryotherapy    . Tonsillectomy    . Appendectomy    . Dilation and curettage of uterus    . Total knee arthroplasty  07/03/2011    Procedure: TOTAL KNEE ARTHROPLASTY;  Surgeon: Valeria Batman, MD;  Location: [facility] OR;  Service: Orthopedics;  Laterality: Right;  . Transthoracic echocardiogram  04/12/2011    EF>55%; mod concentric LVH; mild-mod TR  . Varicose vein surgery      Family History  Problem Relation Age of Onset  . Multiple sclerosis Sister   . Diabetes Sister   . Heart disease Sister   .  Heart attack Brother   . Stroke Maternal Grandmother   . Hypertension Sister   . Hyperlipidemia Sister   . Stroke Sister    Social History:  reports that she quit smoking about 68 years ago. She has never used smokeless tobacco. She reports that she does not drink alcohol or use illicit drugs.  Allergies: No Known Allergies   OUTPATIENT MEDICATIONS: No current facility-administered medications on file prior to encounter.   Current Outpatient Prescriptions on File Prior to Encounter  Medication Sig Dispense Refill  . albuterol (PROVENTIL HFA;VENTOLIN HFA) 108 (90 BASE) MCG/ACT inhaler Inhale 2 puffs into the lungs every 4 (four) hours as needed for wheezing or shortness of breath. For shortness of breath    . albuterol (PROVENTIL) (5 MG/ML) 0.5% nebulizer  solution Take 0.5 mL (2.5 mg total) by nebulization every 6 (six) hours as needed for wheezing. (Patient taking differently: Take 2.5 mg by nebulization every 4 (four) hours as needed for wheezing or shortness of breath. ) 20 mL 12  . amLODipine (NORVASC) 2.5 MG tablet Take 2.5 mg by mouth every morning.     Marland Kitchen amoxicillin-clavulanate (AUGMENTIN) 875-125 MG tablet Take 1 tablet by mouth 2 (two) times daily. 20 tablet 0  . aspirin EC 81 MG tablet Take 1 tablet (81 mg total) by mouth daily. (Patient taking differently: Take 81 mg by mouth every morning. ) 30 tablet 0  . chlorpheniramine (CHLOR-TRIMETON) 4 MG tablet Take 4 mg by mouth at bedtime.    Marland Kitchen dextromethorphan (DELSYM) 30 MG/5ML liquid Take 2 tsp twice a day as needed for cough    . diphenhydramine-acetaminophen (TYLENOL PM) 25-500 MG TABS Take 1-2 tablets by mouth at bedtime as needed (sleep).     . famotidine (PEPCID) 20 MG tablet One at bedtime    . Glucosamine-Chondroitin (GLUCOSAMINE CHONDR COMPLEX PO) Take 1 tablet by mouth daily at 12 noon.     . hydrochlorothiazide (HYDRODIURIL) 25 MG tablet Take 25 mg by mouth every morning.    Marland Kitchen ipratropium (ATROVENT) 0.06 % nasal spray Place 2 sprays into both nostrils 4 (four) times daily. (Patient taking differently: Take 2 puffs four times daily as needed for nasal drip) 15 mL 12  . irbesartan (AVAPRO) 300 MG tablet TAKE ONE TABLET BY MOUTH ONE TIME DAILY (Patient taking differently: TAKE ONE TABLET BY MOUTH EVERY MORNING) 30 tablet 5  . levothyroxine (SYNTHROID, LEVOTHROID) 88 MCG tablet Take 88 mcg by mouth daily before breakfast.    . Magnesium 250 MG TABS Take 1 tablet by mouth daily at 12 noon.    . methocarbamol (ROBAXIN) 500 MG tablet Take 500 mg by mouth 2 (two) times daily.    . MetroNIDAZOLE (FLAGYL PO) Take by mouth. Apply to face as needed for rash    . metroNIDAZOLE (METROCREAM) 0.75 % cream Apply 1 application topically 2 (two) times daily as needed. To the face as directed  2  .  naproxen sodium (ANAPROX) 220 MG tablet Take per bottle as needed for joint pain    . nebivolol (BYSTOLIC) 10 MG tablet Take 1 tablet (10 mg total) by mouth daily. 30 tablet 0  . OXYGEN 2 l/m with sleep and exertion    . pantoprazole (PROTONIX) 40 MG tablet Take 1 tablet (40 mg total) by mouth 2 (two) times daily. (Patient taking differently: Take 40 mg by mouth 2 (two) times daily before a meal. ) 60 tablet 4  . predniSONE (DELTASONE) 10 MG tablet Take  2 each am  until better then 1 daily 100 tablet 2  . traMADol (ULTRAM) 50 MG tablet TAKE 1 TABLET BY MOUTH EVERY 4 HOURS FOR PAIN OR COUGH 60 tablet 0     Results for orders placed or performed during the hospital encounter of 06/03/15 (from the past 48 hour(s))  CBC with Differential     Status: Abnormal   Collection Time: 06/03/15  2:38 PM  Result Value Ref Range   WBC 10.8 (H) 4.0 - 10.5 K/uL   RBC 4.14 3.87 - 5.11 MIL/uL   Hemoglobin 11.8 (L) 12.0 - 15.0 g/dL   HCT 16.137.9 09.636.0 - 04.546.0 %   MCV 91.5 78.0 - 100.0 fL   MCH 28.5 26.0 - 34.0 pg   MCHC 31.1 30.0 - 36.0 g/dL   RDW 40.914.1 81.111.5 - 91.415.5 %   Platelets 291 150 - 400 K/uL   Neutrophils Relative % 92 %   Neutro Abs 10.0 (H) 1.7 - 7.7 K/uL   Lymphocytes Relative 6 %   Lymphs Abs 0.6 (L) 0.7 - 4.0 K/uL   Monocytes Relative 2 %   Monocytes Absolute 0.2 0.1 - 1.0 K/uL   Eosinophils Relative 0 %   Eosinophils Absolute 0.0 0.0 - 0.7 K/uL   Basophils Relative 0 %   Basophils Absolute 0.0 0.0 - 0.1 K/uL  I-stat troponin, ED     Status: Abnormal   Collection Time: 06/03/15  2:38 PM  Result Value Ref Range   Troponin i, poc 1.03 (HH) 0.00 - 0.08 ng/mL   Comment NOTIFIED PHYSICIAN    Comment 3            Comment: Due to the release kinetics of cTnI, a negative result within the first hours of the onset of symptoms does not rule out myocardial infarction with certainty. If myocardial infarction is still suspected, repeat the test at appropriate intervals.   I-stat Chem 8, ED      Status: Abnormal   Collection Time: 06/03/15  2:40 PM  Result Value Ref Range   Sodium 138 135 - 145 mmol/L   Potassium 4.7 3.5 - 5.1 mmol/L   Chloride 98 (L) 101 - 111 mmol/L   BUN 32 (H) 6 - 20 mg/dL   Creatinine, Ser 7.821.10 (H) 0.44 - 1.00 mg/dL   Glucose, Bld 956156 (H) 65 - 99 mg/dL   Calcium, Ion 2.131.13 0.861.13 - 1.30 mmol/L   TCO2 28 0 - 100 mmol/L   Hemoglobin 13.3 12.0 - 15.0 g/dL   HCT 57.839.0 46.936.0 - 62.946.0 %   No results found.  ROS: General:+ colds saw pul.or fevers, no weight changes Skin:no rashes or ulcers HEENT:no blurred vision, no congestion CV:see HPI PUL:see HPI GI:no diarrhea constipation or melena, no indigestion GU:no hematuria, no dysuria MS:no joint pain, no claudication Neuro:no syncope, no lightheadedness Endo:no diabetes, + thyroid disease  Blood pressure 149/61, pulse 68, temperature 98.1 F (36.7 C), temperature source Oral, resp. rate 16, SpO2 97 %. PE: General:Pleasant affect, NAD Skin:Warm and dry, brisk capillary refill HEENT:normocephalic, sclera clear, mucus membranes moist Neck:supple, + JVD, no bruits  Heart:S1S2 RRR with 1-2/6 systolic murmur, no gallup, rub or click Lungs: with rales/crackles, no rhonchi, or rare wheezes BMW:UXLKAbd:soft, non tender, + BS, do not palpate liver spleen or masses Ext:no lower ext edema, 2+ pedal pulses, 2+ radial pulses Neuro:alert and oriented X 3, MAE, follows commands, + facial symmetry    Assessment/Plan Principal Problem:   Angina at rest [facility](HCC) Active Problems:   HTN (hypertension)  Hypothyroidism   Pulmonary fibrosis (HCC)   Chronic respiratory failure with hypoxia (HCC)   Elevated troponin  Angina- with elevated troponin.  Chest pressure and SOB most of night.   She did take ASA.  Now troponin POC 1.03  Admit and place on IV heparin.  Perhaps this is more resp. And elevated troponin demand ischemia.  But with her chest pain and arm pain is worrisome for CAD.  Add NTG paste for now.  Serial troponins.  Dr. Rennis Golden  to see for further recommendations.   Recent URI on steroid taper continue prednisone  SOB due to angina last pm is on home oxygen, had to sit up to breathe last night.  HTN controlled  Pulmonary fibrosis followed by Dr. Sherene Sires  Chronic resp. Failure on home oxygen continue.    Nada Boozer Nurse Practitioner Certified [facility] Pager [phone] or after 5pm or weekends call [phone] 06/03/2015, 3:08 PM

## 2015-06-03 NOTE — ED Notes (Signed)
Phlebotomy at the bedside  

## 2015-06-04 ENCOUNTER — Inpatient Hospital Stay (HOSPITAL_COMMUNITY): Payer: Medicare Other

## 2015-06-04 DIAGNOSIS — I208 Other forms of angina pectoris: Secondary | ICD-10-CM

## 2015-06-04 DIAGNOSIS — R079 Chest pain, unspecified: Secondary | ICD-10-CM

## 2015-06-04 LAB — HEPARIN LEVEL (UNFRACTIONATED)
HEPARIN UNFRACTIONATED: 0.49 [IU]/mL (ref 0.30–0.70)
HEPARIN UNFRACTIONATED: 0.44 [IU]/mL (ref 0.30–0.70)
Heparin Unfractionated: 0.52 IU/mL (ref 0.30–0.70)

## 2015-06-04 LAB — CBC
HCT: 34 % — ABNORMAL LOW (ref 36.0–46.0)
Hemoglobin: 10.7 g/dL — ABNORMAL LOW (ref 12.0–15.0)
MCH: 28.6 pg (ref 26.0–34.0)
MCHC: 31.5 g/dL (ref 30.0–36.0)
MCV: 90.9 fL (ref 78.0–100.0)
Platelets: 246 10*3/uL (ref 150–400)
RBC: 3.74 MIL/uL — AB (ref 3.87–5.11)
RDW: 13.9 % (ref 11.5–15.5)
WBC: 9.6 10*3/uL (ref 4.0–10.5)

## 2015-06-04 LAB — BASIC METABOLIC PANEL
ANION GAP: 7 (ref 5–15)
BUN: 29 mg/dL — ABNORMAL HIGH (ref 6–20)
CALCIUM: 8.8 mg/dL — AB (ref 8.9–10.3)
CO2: 30 mmol/L (ref 22–32)
CREATININE: 1.23 mg/dL — AB (ref 0.44–1.00)
Chloride: 101 mmol/L (ref 101–111)
GFR, EST AFRICAN AMERICAN: 45 mL/min — AB (ref >60)
GFR, EST NON AFRICAN AMERICAN: 39 mL/min — AB (ref >60)
Glucose, Bld: 98 mg/dL (ref 65–99)
Potassium: 3.9 mmol/L (ref 3.5–5.1)
SODIUM: 138 mmol/L (ref 135–145)

## 2015-06-04 LAB — HEMOGLOBIN A1C
Hgb A1c MFr Bld: 6.6 % — ABNORMAL HIGH (ref 4.8–5.6)
Mean Plasma Glucose: 143 mg/dL

## 2015-06-04 LAB — LIPID PANEL
Cholesterol: 166 mg/dL (ref 0–200)
HDL: 72 mg/dL (ref >40)
LDL CALC: 82 mg/dL (ref 0–99)
TRIGLYCERIDES: 60 mg/dL (ref <150)
Total CHOL/HDL Ratio: 2.3 RATIO
VLDL: 12 mg/dL (ref 0–40)

## 2015-06-04 LAB — ECHOCARDIOGRAM COMPLETE
Height: 63 in
WEIGHTICAEL: 1806.4 [oz_av]

## 2015-06-04 LAB — TROPONIN I: TROPONIN I: 4.32 ng/mL — AB (ref <0.031)

## 2015-06-04 MED ORDER — PERFLUTREN LIPID MICROSPHERE
1.0000 mL | INTRAVENOUS | Status: AC | PRN
  Administered 2015-06-04: 2 mL via INTRAVENOUS
  Filled 2015-06-04: qty 10

## 2015-06-04 MED ORDER — PERFLUTREN LIPID MICROSPHERE
INTRAVENOUS | Status: AC
  Administered 2015-06-04: 2 mL via INTRAVENOUS
  Filled 2015-06-04: qty 10

## 2015-06-04 MED ORDER — BOOST PLUS PO LIQD
237.0000 mL | ORAL | Status: DC
  Administered 2015-06-06 – 2015-06-07 (×2): 237 mL via ORAL
  Filled 2015-06-04 (×5): qty 237

## 2015-06-04 MED ORDER — ADULT MULTIVITAMIN W/MINERALS CH
1.0000 | ORAL_TABLET | Freq: Every day | ORAL | Status: DC
  Administered 2015-06-05 – 2015-06-08 (×4): 1 via ORAL
  Filled 2015-06-04 (×4): qty 1

## 2015-06-04 MED ORDER — NITROGLYCERIN IN D5W 200-5 MCG/ML-% IV SOLN
0.0000 ug/min | INTRAVENOUS | Status: DC
  Administered 2015-06-04: 2 ug/min via INTRAVENOUS
  Filled 2015-06-04: qty 250

## 2015-06-04 NOTE — Progress Notes (Signed)
Echocardiogram 2D Echocardiogram has been performed.  [name] 06/04/2015, 10:57 AM

## 2015-06-04 NOTE — Progress Notes (Signed)
Primary cardiologist: Dr. Zoila Shutter  Seen for followup: NSTEMI  Subjective:    Intermittent recurring chest pain. No breathlessness at rest. Slept reasonably well. Appetite fair.  Objective:   Temp:  [98.1 F (36.7 C)-98.5 F (36.9 C)] 98.1 F (36.7 C) (05/27 0838) Pulse Rate:  [54-79] 66 (05/27 0800) Resp:  [12-30] 30 (05/27 0800) BP: (96-154)/(48-86) 132/63 mmHg (05/27 0800) SpO2:  [93 %-100 %] 98 % (05/27 0800) Weight:  [112 lb (50.803 kg)-118 lb 6.2 oz (53.7 kg)] 112 lb 14.4 oz (51.211 kg) (05/27 0300)    Filed Weights   06/03/15 1600 06/03/15 1735 06/04/15 0300  Weight: 118 lb 6.2 oz (53.7 kg) 112 lb (50.803 kg) 112 lb 14.4 oz (51.211 kg)    Intake/Output Summary (Last 24 hours) at 06/04/15 0911 Last data filed at 06/04/15 1610  Gross per 24 hour  Intake 844.03 ml  Output    800 ml  Net  44.03 ml    Telemetry: Sinus rhythm.  Exam:  General: Pleasant elderly woman in no distress.  Lungs: Scattered coarse rhonchi, no wheezing.  Cardiac: RRR without gallop.  Abdomen: NABS.  Extremities: No pitting edema.  Lab Results:  Basic Metabolic Panel:  Recent Labs Lab 06/03/15 1440 06/03/15 1615 06/04/15 0349  NA 138  --  138  K 4.7  --  3.9  CL 98*  --  101  CO2  --   --  30  GLUCOSE 156*  --  98  BUN 32*  --  29*  CREATININE 1.10*  --  1.23*  CALCIUM  --   --  8.8*  MG  --  2.0  --     Liver Function Tests:  Recent Labs Lab 06/03/15 1615  AST 29  ALT 18  ALKPHOS 66  BILITOT 0.8  PROT 7.0  ALBUMIN 3.7    CBC:  Recent Labs Lab 06/03/15 1438 06/03/15 1440 06/04/15 0349  WBC 10.8*  --  9.6  HGB 11.8* 13.3 10.7*  HCT 37.9 39.0 34.0*  MCV 91.5  --  90.9  PLT 291  --  246    Cardiac Enzymes:  Recent Labs Lab 06/03/15 1615 06/03/15 2143 06/04/15 0350  TROPONINI 1.67* 2.40* 4.32*    Coagulation:  Recent Labs Lab 06/03/15 1615  INR 1.08    ECG: Sinus rhythm with LBBB.   Medications:   Scheduled  Medications: . amLODipine  2.5 mg Oral q morning - 10a  . amoxicillin-clavulanate  1 tablet Oral BID  . aspirin  324 mg Oral NOW   Or  . aspirin  300 mg Rectal NOW  . [START ON 06/07/2015] aspirin  81 mg Oral Pre-Cath  . aspirin EC  81 mg Oral Daily  . aspirin EC  81 mg Oral Daily  . atorvastatin  40 mg Oral q1800  . famotidine  20 mg Oral Daily  . feeding supplement (ENSURE ENLIVE)  237 mL Oral BID BM  . hydrochlorothiazide  25 mg Oral q morning - 10a  . irbesartan  300 mg Oral q morning - 10a  . levothyroxine  88 mcg Oral QAC breakfast  . magnesium oxide  200 mg Oral Q1200  . methocarbamol  500 mg Oral BID  . nebivolol  10 mg Oral Daily  . nitroGLYCERIN  0.5 inch Topical Q6H  . pantoprazole  40 mg Oral BID  . predniSONE  10 mg Oral Q breakfast  . sodium chloride flush  3 mL Intravenous Q12H  Infusions: . sodium chloride 10 mL/hr (06/03/15 1655)  . [START ON 06/07/2015] sodium chloride     Followed by  . [START ON 06/07/2015] sodium chloride    . heparin 600 Units/hr (06/03/15 2000)     PRN Medications:  sodium chloride, acetaminophen, albuterol, ALPRAZolam, dextromethorphan, nitroGLYCERIN, ondansetron (ZOFRAN) IV, sodium chloride flush, traMADol, zolpidem   Assessment:   1. NSTEMI, troponin I up to 4.3. LBBB by ECG. Cardiac catheterization anticipated on Tuesday.  2. Essential hypertension.  3. Hyperlipidemia.  4. Pulmonary fibrosis. Followed by Dr. Sherene SiresWert and on supplemental oxygen at home.  5. Recent URI, continues on steroid taper.   Plan/Discussion:    Place on supplemental oxygen at 2 L. Continue current regimen including heparin drip and nitroglycerin paste. Echocardiogram pending. Follow-up troponin I level with CBC and BMET tomorrow morning.   Jonelle SidleSamuel G. McDowell, M.D., F.A.C.C.

## 2015-06-04 NOTE — Progress Notes (Signed)
ANTICOAGULATION CONSULT NOTE  Pharmacy Consult for heparin Indication: chest pain/ACS  No Known Allergies  Patient Measurements: Height:  (160 cm) Weight: 112 lb (50.803 kg) IBW/kg (Calculated) : 52.4 Heparin Dosing Weight: 53.7  Vital Signs: Temp: 98.3 F (36.8 C) (05/26 2300) Temp Source: Oral (05/26 2300) BP: 104/60 mmHg (05/26 2300) Pulse Rate: 66 (05/26 2300)  Labs:  Recent Labs  06/03/15 1438 06/03/15 1440 06/03/15 1615 06/03/15 2143 06/04/15 0030  HGB 11.8* 13.3  --   --   --   HCT 37.9 39.0  --   --   --   PLT 291  --   --   --   --   LABPROT  --   --  14.2  --   --   INR  --   --  1.08  --   --   HEPARINUNFRC  --   --   --   --  0.52  CREATININE  --  1.10*  --   --   --   TROPONINI  --   --  1.67* 2.40*  --     Estimated Creatinine Clearance: 29.4 mL/min (by C-G formula based on Cr of 1.1).   Medical History: Past Medical History  Diagnosis Date  . COPD (chronic obstructive pulmonary disease) (HCC)   . Hyperlipemia   . Heart murmur   . Shortness of breath     with exertion  . Asthma   . Pneumonia     hx of frequent episodes of pneumonia  . Headache(784.0)     MIgraine- Visualchanges- flashing lights and cant focus and cant see.  Marland Kitchen GERD (gastroesophageal reflux disease)   . Arthritis   . Diverticulitis 2008  . Pulmonary fibrosis (HCC)   . Hypertension   . Cirrhosis of liver not due to alcohol (HCC)   . Hypothyroidism   . On home O2     2L N/C    Medications:  Scheduled:  . amLODipine  2.5 mg Oral q morning - 10a  . amoxicillin-clavulanate  1 tablet Oral BID  . aspirin  324 mg Oral NOW   Or  . aspirin  300 mg Rectal NOW  . [START ON 06/07/2015] aspirin  81 mg Oral Pre-Cath  . aspirin EC  81 mg Oral Daily  . aspirin EC  81 mg Oral Daily  . atorvastatin  40 mg Oral q1800  . famotidine  20 mg Oral Daily  . feeding supplement (ENSURE ENLIVE)  237 mL Oral BID BM  . hydrochlorothiazide  25 mg Oral q morning - 10a  . irbesartan  300  mg Oral q morning - 10a  . levothyroxine  88 mcg Oral QAC breakfast  . magnesium oxide  200 mg Oral Q1200  . methocarbamol  500 mg Oral BID  . nebivolol  10 mg Oral Daily  . nitroGLYCERIN  0.5 inch Topical Q6H  . pantoprazole  40 mg Oral BID  . predniSONE  10 mg Oral Q breakfast  . sodium chloride flush  3 mL Intravenous Q12H    Assessment: 82 yo woman admitted 06/03/2015 with radiating CP, cough and inc SOB. Trop 1.03. Pharmacy consulted to dose heparin.  PMH HTN, hypothyroidism, HLD, pulmonary fibrosis, GERD, COPD, arthritis  No anticoagulants prior to admission. Hgb 13.3, plt wnl.   Initial HL is therapeutic at 0.52 on heparin 600 units/hr. No issues with infusion or bleeding noted.  Goal of Therapy:  Heparin level 0.3-0.7 units/ml Monitor platelets by anticoagulation protocol:  Yes   Plan:  Continue heparin 600 units/h 8h confirmatory HL Daily HL, CBC Monitor s/sx bleeding   Arlean Hoppingorey M. Newman PiesBall, PharmD, BCPS Clinical Pharmacist Pager [phone]  06/04/2015,1:30 AM

## 2015-06-04 NOTE — Progress Notes (Signed)
ANTICOAGULATION CONSULT NOTE - Follow Up Consult  Pharmacy Consult for heparin Indication: chest pain/ACS  No Known Allergies  Patient Measurements: Height:  (160 cm) Weight: 112 lb 14.4 oz (51.211 kg) IBW/kg (Calculated) : 52.4 Heparin Dosing Weight: 52.4 kg  Vital Signs: Temp: 98.1 F (36.7 C) (05/27 0838) Temp Source: Oral (05/27 0838) BP: 132/58 mmHg (05/27 0900) Pulse Rate: 64 (05/27 0900)  Labs:  Recent Labs  06/03/15 1438 06/03/15 1440 06/03/15 1615 06/03/15 2143 06/04/15 0030 06/04/15 0348 06/04/15 0349 06/04/15 0350 06/04/15 0908  HGB 11.8* 13.3  --   --   --   --  10.7*  --   --   HCT 37.9 39.0  --   --   --   --  34.0*  --   --   PLT 291  --   --   --   --   --  246  --   --   LABPROT  --   --  14.2  --   --   --   --   --   --   INR  --   --  1.08  --   --   --   --   --   --   HEPARINUNFRC  --   --   --   --  0.52 0.49  --   --  0.44  CREATININE  --  1.10*  --   --   --   --  1.23*  --   --   TROPONINI  --   --  1.67* 2.40*  --   --   --  4.32*  --     Estimated Creatinine Clearance: 26.5 mL/min (by C-G formula based on Cr of 1.23).   Medications:  Prescriptions prior to admission  Medication Sig Dispense Refill Last Dose  . albuterol (PROVENTIL HFA;VENTOLIN HFA) 108 (90 BASE) MCG/ACT inhaler Inhale 2 puffs into the lungs every 4 (four) hours as needed for wheezing or shortness of breath. For shortness of breath   Past Month at Unknown time  . albuterol (PROVENTIL) (5 MG/ML) 0.5% nebulizer solution Take 0.5 mL (2.5 mg total) by nebulization every 6 (six) hours as needed for wheezing. (Patient taking differently: Take 2.5 mg by nebulization every 4 (four) hours as needed for wheezing or shortness of breath. ) 20 mL 12 Past Month at Unknown time  . amLODipine (NORVASC) 2.5 MG tablet Take 2.5 mg by mouth every morning.    06/03/2015 at Unknown time  . amoxicillin-clavulanate (AUGMENTIN) 875-125 MG tablet Take 1 tablet by mouth 2 (two) times daily. 20  tablet 0 06/03/2015 at am  . aspirin EC 81 MG tablet Take 1 tablet (81 mg total) by mouth daily. (Patient taking differently: Take 81 mg by mouth every morning. ) 30 tablet 0 06/03/2015 at Unknown time  . Calcium Citrate (CALCITRATE PO) Take 600 mg by mouth daily.   06/03/2015 at Unknown time  . chlorpheniramine (CHLOR-TRIMETON) 4 MG tablet Take 4 mg by mouth at bedtime.   06/02/2015 at Unknown time  . dextromethorphan (DELSYM) 30 MG/5ML liquid Take 30 mg by mouth 2 (two) times daily as needed for cough. Take 2 tsp twice a day as needed for cough   Past Month at Unknown time  . diphenhydramine-acetaminophen (TYLENOL PM) 25-500 MG TABS Take 1-2 tablets by mouth at bedtime as needed (sleep).    Past Month at Unknown time  . famotidine (PEPCID) 20 MG tablet One  at bedtime (Patient taking differently: Take 20 mg by mouth at bedtime. One at bedtime)   06/02/2015 at Unknown time  . hydrochlorothiazide (HYDRODIURIL) 25 MG tablet Take 25 mg by mouth every morning.   06/03/2015 at Unknown time  . ipratropium (ATROVENT) 0.06 % nasal spray Place 2 sprays into both nostrils 4 (four) times daily. (Patient taking differently: Place 1 spray into both nostrils 4 (four) times daily as needed for rhinitis. ) 15 mL 12 Past Month at Unknown time  . irbesartan (AVAPRO) 300 MG tablet TAKE ONE TABLET BY MOUTH ONE TIME DAILY (Patient taking differently: TAKE ONE TABLET BY MOUTH EVERY MORNING) 30 tablet 5 06/03/2015 at Unknown time  . levothyroxine (SYNTHROID, LEVOTHROID) 88 MCG tablet Take 88 mcg by mouth daily before breakfast.   06/03/2015 at Unknown time  . Magnesium 250 MG TABS Take 1 tablet by mouth every morning.    06/03/2015 at Unknown time  . methocarbamol (ROBAXIN) 500 MG tablet Take 500 mg by mouth 2 (two) times daily.   06/03/2015 at am  . naproxen sodium (ANAPROX) 220 MG tablet Take 220 mg by mouth 2 (two) times daily as needed (for pain). Take per bottle as needed for joint pain   Past Month at Unknown time  . nebivolol  (BYSTOLIC) 10 MG tablet Take 1 tablet (10 mg total) by mouth daily. 30 tablet 0 06/03/2015 at 1000  . OXYGEN Inhale 2 L into the lungs daily as needed (for sleep and with exertion). 2 l/m with sleep and exertion   06/02/2015 at Unknown time  . pantoprazole (PROTONIX) 40 MG tablet Take 1 tablet (40 mg total) by mouth 2 (two) times daily. (Patient taking differently: Take 40 mg by mouth 2 (two) times daily before a meal. ) 60 tablet 4 06/03/2015 at am  . Polyethyl Glycol-Propyl Glycol (SYSTANE) 0.4-0.3 % SOLN Apply 1 drop to eye daily as needed (for dry eyes).   Past Month at Unknown time  . predniSONE (DELTASONE) 10 MG tablet Take  2 each am until better then 1 daily (Patient taking differently: Take 20 mg by mouth daily with breakfast. Take  2 each am until better then 1 daily) 100 tablet 2 06/03/2015 at Unknown time  . traMADol (ULTRAM) 50 MG tablet TAKE 1 TABLET BY MOUTH EVERY 4 HOURS FOR PAIN OR COUGH 60 tablet 0 Past Month at Unknown time  . amoxicillin-clavulanate (AUGMENTIN) 875-125 MG tablet Take 1 tablet by mouth 2 (two) times daily.       Assessment: 82 yo F admitted 06/03/2015 with radiating chest pain, cough and increased SOB. Pharmacy consulted to dose heparin for ACS. Patient was not on anticoagulation prior to admission.  HL 0.44 (therapeutic), Hgb stable 10.7, Plts wnl. No s/sx of bleeding noted.   Goal of Therapy:  Heparin level 0.3-0.7 units/ml Monitor platelets by anticoagulation protocol: Yes   Plan:  - Continue heparin 600 units/h - Monitor daily HL, CBC and s/sx bleeding  - F/u plans for cath - anticipated on Tuesday  Casilda Carlsaylor Danaly Bari, PharmD. PGY-1 Pharmacy Resident Pager: [phone] 06/04/2015,10:08 AM

## 2015-06-04 NOTE — Progress Notes (Signed)
Pt. Had minor epigastric pain (3/10) when we took her to the bathroom.  Noticed some slight ST elevation on monitor.  EKG was done and looked similar to the one earlier that day with no report of STEMI.  Troponin went to 2.4.  Pt. Is planning on going to cath lab on Tuesday and is on heparin gtt now.  Cards on call MD made aware. Will continue to monitor.

## 2015-06-04 NOTE — Progress Notes (Signed)
Initial Nutrition Assessment   INTERVENTION:  Provide Boost Plus once daily, provides 360 kcal and 14 grams of protein Multivitamin with minerals daily   NUTRITION DIAGNOSIS:   Predicted suboptimal nutrient intake related to poor appetite, chronic illness as evidenced by per patient/family report, percent weight loss.   GOAL:   Patient will meet greater than or equal to 90% of their needs   MONITOR:   PO intake, Supplement acceptance, Labs, Weight trends, Skin, I & O's  REASON FOR ASSESSMENT:   Malnutrition Screening Tool    ASSESSMENT:   82 yo female patient of mine with HTN, mild dyslipidemia (not on statins), pulmonary fibrosis and GERD. Most of her past symptoms have been related to fibrosis, dyspnea and recurrent lung infections. She reports increased dyspnea, wheezing and cough over the past several weeks.  Bedside procedure in process at time of first visit, nursing care in process at time of second visit, pt eating lunch at time of third visit. Pt screened via malnutrition screening tool due to report of decreased appetite and weight loss. Per weight history, pt has lost 5% of her body weight in the past month. Per nursing notes, pt ate 100% of breakfast this morning. Per nursing notes, pt declined Ensure supplement. Pt reports that she lost weight due to pneumonia, but her appetite has picked up the past few days. She sometimes drinks Boost at home, but doesn't like Ensure.   Labs: low hemoglobin, elevated troponin  Diet Order:  Diet Heart Room service appropriate?: Yes; Fluid consistency:: Thin  Skin:  Reviewed, no issues  Last BM:  5/26  Height:   Ht Readings from Last 1 Encounters:  06/03/15 5\' 3"  (1.6 m)    Weight:   Wt Readings from Last 1 Encounters:  06/04/15 112 lb 14.4 oz (51.211 kg)    Ideal Body Weight:  52.27 kg  BMI:  Body mass index is 20 kg/(m^2).  Estimated Nutritional Needs:   Kcal:  1250-1450  Protein:  65-75 grams  Fluid:  1.4  L/day  EDUCATION NEEDS:   No education needs identified at this time  Dorothea Ogleeanne Haydan Wedig RD, LDN Inpatient Clinical Dietitian Pager: [phone] After Hours Pager: [phone]

## 2015-06-05 ENCOUNTER — Ambulatory Visit (HOSPITAL_COMMUNITY): Admit: 2015-06-05 | Payer: Self-pay | Admitting: Cardiovascular Disease

## 2015-06-05 ENCOUNTER — Encounter (HOSPITAL_COMMUNITY)
Admission: EM | Disposition: A | Discharge status: Home / Self Care | Payer: Self-pay | Source: Home / Self Care | Attending: Internal Medicine

## 2015-06-05 DIAGNOSIS — I2510 Atherosclerotic heart disease of native coronary artery without angina pectoris: Secondary | ICD-10-CM

## 2015-06-05 HISTORY — PX: CARDIAC CATHETERIZATION: SHX172

## 2015-06-05 LAB — CBC
HEMATOCRIT: 37.4 % (ref 36.0–46.0)
HEMOGLOBIN: 11.6 g/dL — AB (ref 12.0–15.0)
MCH: 28.5 pg (ref 26.0–34.0)
MCHC: 31 g/dL (ref 30.0–36.0)
MCV: 91.9 fL (ref 78.0–100.0)
Platelets: 253 10*3/uL (ref 150–400)
RBC: 4.07 MIL/uL (ref 3.87–5.11)
RDW: 14.1 % (ref 11.5–15.5)
WBC: 12 10*3/uL — ABNORMAL HIGH (ref 4.0–10.5)

## 2015-06-05 LAB — POCT ACTIVATED CLOTTING TIME
ACTIVATED CLOTTING TIME: 193 s
ACTIVATED CLOTTING TIME: 172 s
ACTIVATED CLOTTING TIME: 162 s

## 2015-06-05 LAB — TROPONIN I: TROPONIN I: 8.3 ng/mL — AB (ref <0.031)

## 2015-06-05 LAB — HEPARIN LEVEL (UNFRACTIONATED): Heparin Unfractionated: 0.47 IU/mL (ref 0.30–0.70)

## 2015-06-05 SURGERY — LEFT HEART CATH AND CORONARY ANGIOGRAPHY

## 2015-06-05 MED ORDER — NITROGLYCERIN 1 MG/10 ML FOR IR/CATH LAB
INTRA_ARTERIAL | Status: DC | PRN
  Administered 2015-06-05: 200 ug via INTRACORONARY

## 2015-06-05 MED ORDER — TICAGRELOR 90 MG PO TABS
ORAL_TABLET | ORAL | Status: DC | PRN
  Administered 2015-06-05: 180 mg via ORAL

## 2015-06-05 MED ORDER — SODIUM CHLORIDE 0.9 % IV SOLN: 250.0000 mL | INTRAVENOUS | Status: DC | PRN

## 2015-06-05 MED ORDER — SODIUM CHLORIDE 0.9% FLUSH: 3.0000 mL | INTRAVENOUS | Status: DC | PRN

## 2015-06-05 MED ORDER — IOPAMIDOL (ISOVUE-370) INJECTION 76%
INTRAVENOUS | Status: AC
  Filled 2015-06-05: qty 100

## 2015-06-05 MED ORDER — HEPARIN (PORCINE) IN NACL 2-0.9 UNIT/ML-% IJ SOLN
INTRAMUSCULAR | Status: AC
  Filled 2015-06-05: qty 1000

## 2015-06-05 MED ORDER — LIDOCAINE HCL (PF) 1 % IJ SOLN
INTRAMUSCULAR | Status: DC | PRN
  Administered 2015-06-05: 25 mL

## 2015-06-05 MED ORDER — IOPAMIDOL (ISOVUE-370) INJECTION 76%
INTRAVENOUS | Status: DC | PRN
  Administered 2015-06-05: 80 mL via INTRA_ARTERIAL

## 2015-06-05 MED ORDER — BIVALIRUDIN BOLUS VIA INFUSION - CUPID
INTRAVENOUS | Status: DC | PRN
  Administered 2015-06-05: 38.7 mg via INTRAVENOUS

## 2015-06-05 MED ORDER — SODIUM CHLORIDE 0.9 % IV SOLN: INTRAVENOUS | Status: DC

## 2015-06-05 MED ORDER — IOPAMIDOL (ISOVUE-370) INJECTION 76%
INTRAVENOUS | Status: AC
Start: 2015-06-05 — End: 2015-06-05
  Filled 2015-06-05: qty 50

## 2015-06-05 MED ORDER — SODIUM CHLORIDE 0.9% FLUSH: 3.0000 mL | Freq: Two times a day (BID) | INTRAVENOUS | Status: DC

## 2015-06-05 MED ORDER — SODIUM CHLORIDE 0.9 % IV SOLN
1.0000 mg/kg/h | INTRAVENOUS | Status: AC
  Administered 2015-06-05: 1 mg/kg/h via INTRAVENOUS
  Filled 2015-06-05 (×2): qty 250

## 2015-06-05 MED ORDER — HEPARIN (PORCINE) IN NACL 2-0.9 UNIT/ML-% IJ SOLN
INTRAMUSCULAR | Status: DC | PRN
  Administered 2015-06-05: 1000 mL

## 2015-06-05 MED ORDER — ATROPINE SULFATE 1 MG/10ML IJ SOSY
PREFILLED_SYRINGE | INTRAMUSCULAR | Status: AC
  Filled 2015-06-05: qty 10

## 2015-06-05 MED ORDER — NITROGLYCERIN 1 MG/10 ML FOR IR/CATH LAB
INTRA_ARTERIAL | Status: AC
  Filled 2015-06-05: qty 10

## 2015-06-05 MED ORDER — TICAGRELOR 90 MG PO TABS
ORAL_TABLET | ORAL | Status: AC
  Filled 2015-06-05: qty 2

## 2015-06-05 MED ORDER — BIVALIRUDIN 250 MG IV SOLR
INTRAVENOUS | Status: AC
  Filled 2015-06-05: qty 250

## 2015-06-05 MED ORDER — SODIUM CHLORIDE 0.9 % IV SOLN
250.0000 mg | INTRAVENOUS | Status: DC | PRN
  Administered 2015-06-05: 1 mg/kg/h via INTRAVENOUS

## 2015-06-05 MED ORDER — LIDOCAINE HCL (PF) 1 % IJ SOLN
INTRAMUSCULAR | Status: AC
  Filled 2015-06-05: qty 30

## 2015-06-05 SURGICAL SUPPLY — 13 items
BALLN EMERGE MR 2.0X12 (BALLOONS) ×3
BALLOON EMERGE MR 2.0X12 (BALLOONS) ×2 IMPLANT
CATH INFINITI 5FR MULTPACK ANG (CATHETERS) ×3 IMPLANT
CATH VISTA GUIDE 6FR JR4 (CATHETERS) ×3 IMPLANT
KIT ENCORE 26 ADVANTAGE (KITS) ×3 IMPLANT
KIT HEART LEFT (KITS) ×3 IMPLANT
PACK CARDIAC CATHETERIZATION (CUSTOM PROCEDURE TRAY) ×3 IMPLANT
SHEATH PINNACLE 6F 10CM (SHEATH) ×3 IMPLANT
STENT SYNERGY DES 2.75X24 (Permanent Stent) ×3 IMPLANT
TRANSDUCER W/STOPCOCK (MISCELLANEOUS) ×3 IMPLANT
TUBING CIL FLEX 10 FLL-RA (TUBING) ×3 IMPLANT
WIRE ASAHI PROWATER 180CM (WIRE) ×3 IMPLANT
WIRE EMERALD 3MM-J .035X150CM (WIRE) ×3 IMPLANT

## 2015-06-05 NOTE — Interval H&P Note (Signed)
Cath Lab Visit (complete for each Cath Lab visit)  Clinical Evaluation Leading to the Procedure:   ACS: Yes.    Non-ACS:    Anginal Classification: CCS IV  Anti-ischemic medical therapy: Minimal Therapy (1 class of medications)  Non-Invasive Test Results: No non-invasive testing performed  Prior CABG: No previous CABG      History and Physical Interval Note:  06/05/2015 10:44 AM  [name]  has presented today for surgery, with the diagnosis of nonstemi  The various methods of treatment have been discussed with the patient and family. After consideration of risks, benefits and other options for treatment, the patient has consented to  Procedure(s): Left Heart Cath and Coronary Angiography (N/A) as a surgical intervention .  The patient's history has been reviewed, patient examined, no change in status, stable for surgery.  I have reviewed the patient's chart and labs.  Questions were answered to the patient's satisfaction.     [name], Raj Landress

## 2015-06-05 NOTE — Progress Notes (Signed)
ANTICOAGULATION CONSULT NOTE - Follow Up Consult  Pharmacy Consult for heparin Indication: chest pain/ACS  No Known Allergies  Patient Measurements: Height: 5\' 3"  (160 cm) Weight: 113 lb 12.8 oz (51.619 kg) IBW/kg (Calculated) : 52.4 Heparin Dosing Weight: 51.6 kg  Vital Signs: Temp: 98.5 F (36.9 C) (05/28 0400) Temp Source: Oral (05/28 0400) BP: 130/56 mmHg (05/28 0600) Pulse Rate: 58 (05/28 0700)  Labs:  Recent Labs  06/03/15 1438 06/03/15 1440  06/03/15 1615 06/03/15 2143  06/04/15 0348 06/04/15 0349 06/04/15 0350 06/04/15 0908 06/05/15 0350  HGB 11.8* 13.3  --   --   --   --   --  10.7*  --   --  11.6*  HCT 37.9 39.0  --   --   --   --   --  34.0*  --   --  37.4  PLT 291  --   --   --   --   --   --  246  --   --  253  LABPROT  --   --   --  14.2  --   --   --   --   --   --   --   INR  --   --   --  1.08  --   --   --   --   --   --   --   HEPARINUNFRC  --   --   --   --   --   < > 0.49  --   --  0.44 0.47  CREATININE  --  1.10*  --   --   --   --   --  1.23*  --   --   --   TROPONINI  --   --   < > 1.67* 2.40*  --   --   --  4.32*  --  8.30*  < > = values in this interval not displayed.  Estimated Creatinine Clearance: 26.7 mL/min (by C-G formula based on Cr of 1.23).   Medications:  Prescriptions prior to admission  Medication Sig Dispense Refill Last Dose  . albuterol (PROVENTIL HFA;VENTOLIN HFA) 108 (90 BASE) MCG/ACT inhaler Inhale 2 puffs into the lungs every 4 (four) hours as needed for wheezing or shortness of breath. For shortness of breath   Past Month at Unknown time  . albuterol (PROVENTIL) (5 MG/ML) 0.5% nebulizer solution Take 0.5 mL (2.5 mg total) by nebulization every 6 (six) hours as needed for wheezing. (Patient taking differently: Take 2.5 mg by nebulization every 4 (four) hours as needed for wheezing or shortness of breath. ) 20 mL 12 Past Month at Unknown time  . amLODipine (NORVASC) 2.5 MG tablet Take 2.5 mg by mouth every morning.     06/03/2015 at Unknown time  . amoxicillin-clavulanate (AUGMENTIN) 875-125 MG tablet Take 1 tablet by mouth 2 (two) times daily. 20 tablet 0 06/03/2015 at am  . aspirin EC 81 MG tablet Take 1 tablet (81 mg total) by mouth daily. (Patient taking differently: Take 81 mg by mouth every morning. ) 30 tablet 0 06/03/2015 at Unknown time  . Calcium Citrate (CALCITRATE PO) Take 600 mg by mouth daily.   06/03/2015 at Unknown time  . chlorpheniramine (CHLOR-TRIMETON) 4 MG tablet Take 4 mg by mouth at bedtime.   06/02/2015 at Unknown time  . dextromethorphan (DELSYM) 30 MG/5ML liquid Take 30 mg by mouth 2 (two) times daily as needed for cough. Take  2 tsp twice a day as needed for cough   Past Month at Unknown time  . diphenhydramine-acetaminophen (TYLENOL PM) 25-500 MG TABS Take 1-2 tablets by mouth at bedtime as needed (sleep).    Past Month at Unknown time  . famotidine (PEPCID) 20 MG tablet One at bedtime (Patient taking differently: Take 20 mg by mouth at bedtime. One at bedtime)   06/02/2015 at Unknown time  . hydrochlorothiazide (HYDRODIURIL) 25 MG tablet Take 25 mg by mouth every morning.   06/03/2015 at Unknown time  . ipratropium (ATROVENT) 0.06 % nasal spray Place 2 sprays into both nostrils 4 (four) times daily. (Patient taking differently: Place 1 spray into both nostrils 4 (four) times daily as needed for rhinitis. ) 15 mL 12 Past Month at Unknown time  . irbesartan (AVAPRO) 300 MG tablet TAKE ONE TABLET BY MOUTH ONE TIME DAILY (Patient taking differently: TAKE ONE TABLET BY MOUTH EVERY MORNING) 30 tablet 5 06/03/2015 at Unknown time  . levothyroxine (SYNTHROID, LEVOTHROID) 88 MCG tablet Take 88 mcg by mouth daily before breakfast.   06/03/2015 at Unknown time  . Magnesium 250 MG TABS Take 1 tablet by mouth every morning.    06/03/2015 at Unknown time  . methocarbamol (ROBAXIN) 500 MG tablet Take 500 mg by mouth 2 (two) times daily.   06/03/2015 at am  . naproxen sodium (ANAPROX) 220 MG tablet Take 220 mg by  mouth 2 (two) times daily as needed (for pain). Take per bottle as needed for joint pain   Past Month at Unknown time  . nebivolol (BYSTOLIC) 10 MG tablet Take 1 tablet (10 mg total) by mouth daily. 30 tablet 0 06/03/2015 at 1000  . OXYGEN Inhale 2 L into the lungs daily as needed (for sleep and with exertion). 2 l/m with sleep and exertion   06/02/2015 at Unknown time  . pantoprazole (PROTONIX) 40 MG tablet Take 1 tablet (40 mg total) by mouth 2 (two) times daily. (Patient taking differently: Take 40 mg by mouth 2 (two) times daily before a meal. ) 60 tablet 4 06/03/2015 at am  . Polyethyl Glycol-Propyl Glycol (SYSTANE) 0.4-0.3 % SOLN Apply 1 drop to eye daily as needed (for dry eyes).   Past Month at Unknown time  . predniSONE (DELTASONE) 10 MG tablet Take  2 each am until better then 1 daily (Patient taking differently: Take 20 mg by mouth daily with breakfast. Take  2 each am until better then 1 daily) 100 tablet 2 06/03/2015 at Unknown time  . traMADol (ULTRAM) 50 MG tablet TAKE 1 TABLET BY MOUTH EVERY 4 HOURS FOR PAIN OR COUGH 60 tablet 0 Past Month at Unknown time  . amoxicillin-clavulanate (AUGMENTIN) 875-125 MG tablet Take 1 tablet by mouth 2 (two) times daily.       Assessment: 82 yo F admitted 06/03/2015 with radiating chest pain, cough and increased SOB. Pharmacy consulted to dose heparin for ACS. Patient was not on anticoagulation prior to admission.  HL 0.47 (therapeutic), Hgb stable 11.6, Plts wnl. No s/sx of bleeding noted.   Goal of Therapy:  Heparin level 0.3-0.7 units/ml Monitor platelets by anticoagulation protocol: Yes   Plan:  - Continue heparin 600 units/h - Monitor daily HL, CBC and s/sx bleeding  - F/u plans for cath - anticipated on Tuesday  Casilda Carls, PharmD. PGY-1 Pharmacy Resident Pager: [phone] 06/05/2015,7:56 AM

## 2015-06-05 NOTE — Progress Notes (Signed)
Primary cardiologist: Dr. Zoila ShutterKenneth Hilty  Seen for followup: NSTEMI  Subjective:    Patient reports having chest pain intermittently most of the night, did not sleep well. Reports "4 out of 10" chest pain this morning. Nitroglycerin just increased.  Objective:   Temp:  [97.4 F (36.3 C)-99 F (37.2 C)] 98 F (36.7 C) (05/28 0830) Pulse Rate:  [56-79] 63 (05/28 0830) Resp:  [15-29] 28 (05/28 0830) BP: (110-150)/(53-73) 126/62 mmHg (05/28 0830) SpO2:  [93 %-100 %] 99 % (05/28 0830) Weight:  [113 lb 12.8 oz (51.619 kg)] 113 lb 12.8 oz (51.619 kg) (05/28 0641) Last BM Date: 06/04/15  Filed Weights   06/03/15 1735 06/04/15 0300 06/05/15 0641  Weight: 112 lb (50.803 kg) 112 lb 14.4 oz (51.211 kg) 113 lb 12.8 oz (51.619 kg)    Intake/Output Summary (Last 24 hours) at 06/05/15 0920 Last data filed at 06/05/15 0800  Gross per 24 hour  Intake 379.55 ml  Output    800 ml  Net -420.45 ml    Telemetry: Sinus rhythm.  Exam:  General: Pleasant elderly woman in no distress.  Lungs: Scattered coarse rhonchi, no wheezing.  Cardiac: RRR without gallop.  Abdomen: NABS.  Extremities: No pitting edema.  Lab Results:  Basic Metabolic Panel:  Recent Labs Lab 06/03/15 1440 06/03/15 1615 06/04/15 0349  NA 138  --  138  K 4.7  --  3.9  CL 98*  --  101  CO2  --   --  30  GLUCOSE 156*  --  98  BUN 32*  --  29*  CREATININE 1.10*  --  1.23*  CALCIUM  --   --  8.8*  MG  --  2.0  --     Liver Function Tests:  Recent Labs Lab 06/03/15 1615  AST 29  ALT 18  ALKPHOS 66  BILITOT 0.8  PROT 7.0  ALBUMIN 3.7    CBC:  Recent Labs Lab 06/03/15 1438 06/03/15 1440 06/04/15 0349 06/05/15 0350  WBC 10.8*  --  9.6 12.0*  HGB 11.8* 13.3 10.7* 11.6*  HCT 37.9 39.0 34.0* 37.4  MCV 91.5  --  90.9 91.9  PLT 291  --  246 253    Cardiac Enzymes:  Recent Labs Lab 06/03/15 2143 06/04/15 0350 06/05/15 0350  TROPONINI 2.40* 4.32* 8.30*    Coagulation:  Recent  Labs Lab 06/03/15 1615  INR 1.08    ECG: Sinus rhythm with IVCD of left bundle branch block type, rule out old inferior infarct pattern.  Echocardiogram 06/04/2015: Study Conclusions  - Left ventricle: The cavity size was normal. Wall thickness was  normal. Systolic function was mildly reduced. The estimated  ejection fraction was in the range of 45% to 50%. Doppler  parameters are consistent with abnormal left ventricular  relaxation (grade 1 diastolic dysfunction). Doppler parameters  are consistent with high ventricular filling pressure. - Regional wall motion abnormality: Severe hypokinesis of the basal  inferior myocardium; moderate hypokinesis of the mid inferoseptal  and mid-apical inferior myocardium; mild hypokinesis of the  basal-mid inferolateral myocardium. - Aortic valve: Mildly to moderately calcified annulus. Trileaflet;  mildly thickened leaflets. At least mild aortic stenosis was  present. - Mitral valve: Moderately thickened leaflets . Moderate eccentric  regurgitation. - Left atrium: The atrium was moderately dilated.   Medications:   Scheduled Medications: . amLODipine  2.5 mg Oral q morning - 10a  . amoxicillin-clavulanate  1 tablet Oral BID  . [START ON 06/07/2015] aspirin  81 mg Oral Pre-Cath  . aspirin EC  81 mg Oral Daily  . atorvastatin  40 mg Oral q1800  . famotidine  20 mg Oral Daily  . hydrochlorothiazide  25 mg Oral q morning - 10a  . irbesartan  300 mg Oral q morning - 10a  . lactose free nutrition  237 mL Oral Q24H  . levothyroxine  88 mcg Oral QAC breakfast  . magnesium oxide  200 mg Oral Q1200  . methocarbamol  500 mg Oral BID  . multivitamin with minerals  1 tablet Oral Daily  . nebivolol  10 mg Oral Daily  . pantoprazole  40 mg Oral BID  . predniSONE  10 mg Oral Q breakfast  . sodium chloride flush  3 mL Intravenous Q12H    Infusions: . sodium chloride 10 mL/hr (06/03/15 1655)  . [START ON 06/07/2015] sodium chloride       Followed by  . [START ON 06/07/2015] sodium chloride    . heparin 600 Units/hr (06/05/15 0601)  . nitroGLYCERIN 2 mcg/min (06/04/15 1245)    PRN Medications: sodium chloride, acetaminophen, albuterol, ALPRAZolam, dextromethorphan, nitroGLYCERIN, ondansetron (ZOFRAN) IV, sodium chloride flush, traMADol, zolpidem   Assessment:   1. NSTEMI, troponin I up to 8.3 from 4.3 yesterday. She continues to have recurring chest pain despite medical therapy including IV nitroglycerin and heparin. With present symptomatology and climbing troponin I, cardiac catheterization/PCI being arranged today rather than waiting until Tuesday. Discussed with Dr. Berry.  2. Essential hypertension.  3. Hyperlipidemia.  4. Pulmonary fibrosis. Followed by Dr. Wert and on supplemental oxygen at home.  5. Recent URI, continues on steroid taper.   Plan/Discussion:    We will arrange cardiac catheterization/PCI for today, discussed with Dr. Berry. Continue present medical regimen.  Alfonza Toft G. Allyne Hebert, M.D., F.A.C.C.  

## 2015-06-05 NOTE — H&P (View-Only) (Signed)
Primary cardiologist: Dr. Zoila ShutterKenneth Hilty  Seen for followup: NSTEMI  Subjective:    Patient reports having chest pain intermittently most of the night, did not sleep well. Reports "4 out of 10" chest pain this morning. Nitroglycerin just increased.  Objective:   Temp:  [97.4 F (36.3 C)-99 F (37.2 C)] 98 F (36.7 C) (05/28 0830) Pulse Rate:  [56-79] 63 (05/28 0830) Resp:  [15-29] 28 (05/28 0830) BP: (110-150)/(53-73) 126/62 mmHg (05/28 0830) SpO2:  [93 %-100 %] 99 % (05/28 0830) Weight:  [113 lb 12.8 oz (51.619 kg)] 113 lb 12.8 oz (51.619 kg) (05/28 0641) Last BM Date: 06/04/15  Filed Weights   06/03/15 1735 06/04/15 0300 06/05/15 0641  Weight: 112 lb (50.803 kg) 112 lb 14.4 oz (51.211 kg) 113 lb 12.8 oz (51.619 kg)    Intake/Output Summary (Last 24 hours) at 06/05/15 0920 Last data filed at 06/05/15 0800  Gross per 24 hour  Intake 379.55 ml  Output    800 ml  Net -420.45 ml    Telemetry: Sinus rhythm.  Exam:  General: Pleasant elderly woman in no distress.  Lungs: Scattered coarse rhonchi, no wheezing.  Cardiac: RRR without gallop.  Abdomen: NABS.  Extremities: No pitting edema.  Lab Results:  Basic Metabolic Panel:  Recent Labs Lab 06/03/15 1440 06/03/15 1615 06/04/15 0349  NA 138  --  138  K 4.7  --  3.9  CL 98*  --  101  CO2  --   --  30  GLUCOSE 156*  --  98  BUN 32*  --  29*  CREATININE 1.10*  --  1.23*  CALCIUM  --   --  8.8*  MG  --  2.0  --     Liver Function Tests:  Recent Labs Lab 06/03/15 1615  AST 29  ALT 18  ALKPHOS 66  BILITOT 0.8  PROT 7.0  ALBUMIN 3.7    CBC:  Recent Labs Lab 06/03/15 1438 06/03/15 1440 06/04/15 0349 06/05/15 0350  WBC 10.8*  --  9.6 12.0*  HGB 11.8* 13.3 10.7* 11.6*  HCT 37.9 39.0 34.0* 37.4  MCV 91.5  --  90.9 91.9  PLT 291  --  246 253    Cardiac Enzymes:  Recent Labs Lab 06/03/15 2143 06/04/15 0350 06/05/15 0350  TROPONINI 2.40* 4.32* 8.30*    Coagulation:  Recent  Labs Lab 06/03/15 1615  INR 1.08    ECG: Sinus rhythm with IVCD of left bundle branch block type, rule out old inferior infarct pattern.  Echocardiogram 06/04/2015: Study Conclusions  - Left ventricle: The cavity size was normal. Wall thickness was  normal. Systolic function was mildly reduced. The estimated  ejection fraction was in the range of 45% to 50%. Doppler  parameters are consistent with abnormal left ventricular  relaxation (grade 1 diastolic dysfunction). Doppler parameters  are consistent with high ventricular filling pressure. - Regional wall motion abnormality: Severe hypokinesis of the basal  inferior myocardium; moderate hypokinesis of the mid inferoseptal  and mid-apical inferior myocardium; mild hypokinesis of the  basal-mid inferolateral myocardium. - Aortic valve: Mildly to moderately calcified annulus. Trileaflet;  mildly thickened leaflets. At least mild aortic stenosis was  present. - Mitral valve: Moderately thickened leaflets . Moderate eccentric  regurgitation. - Left atrium: The atrium was moderately dilated.   Medications:   Scheduled Medications: . amLODipine  2.5 mg Oral q morning - 10a  . amoxicillin-clavulanate  1 tablet Oral BID  . [START ON 06/07/2015] aspirin  81 mg Oral Pre-Cath  . aspirin EC  81 mg Oral Daily  . atorvastatin  40 mg Oral q1800  . famotidine  20 mg Oral Daily  . hydrochlorothiazide  25 mg Oral q morning - 10a  . irbesartan  300 mg Oral q morning - 10a  . lactose free nutrition  237 mL Oral Q24H  . levothyroxine  88 mcg Oral QAC breakfast  . magnesium oxide  200 mg Oral Q1200  . methocarbamol  500 mg Oral BID  . multivitamin with minerals  1 tablet Oral Daily  . nebivolol  10 mg Oral Daily  . pantoprazole  40 mg Oral BID  . predniSONE  10 mg Oral Q breakfast  . sodium chloride flush  3 mL Intravenous Q12H    Infusions: . sodium chloride 10 mL/hr (06/03/15 1655)  . [START ON 06/07/2015] sodium chloride       Followed by  . [START ON 06/07/2015] sodium chloride    . heparin 600 Units/hr (06/05/15 0601)  . nitroGLYCERIN 2 mcg/min (06/04/15 1245)    PRN Medications: sodium chloride, acetaminophen, albuterol, ALPRAZolam, dextromethorphan, nitroGLYCERIN, ondansetron (ZOFRAN) IV, sodium chloride flush, traMADol, zolpidem   Assessment:   1. NSTEMI, troponin I up to 8.3 from 4.3 yesterday. She continues to have recurring chest pain despite medical therapy including IV nitroglycerin and heparin. With present symptomatology and climbing troponin I, cardiac catheterization/PCI being arranged today rather than waiting until Tuesday. Discussed with Dr. Allyson Sabal.  2. Essential hypertension.  3. Hyperlipidemia.  4. Pulmonary fibrosis. Followed by Dr. Sherene Sires and on supplemental oxygen at home.  5. Recent URI, continues on steroid taper.   Plan/Discussion:    We will arrange cardiac catheterization/PCI for today, discussed with Dr. Allyson Sabal. Continue present medical regimen.  Jonelle Sidle, M.D., F.A.C.C.

## 2015-06-06 DIAGNOSIS — I25119 Atherosclerotic heart disease of native coronary artery with unspecified angina pectoris: Secondary | ICD-10-CM

## 2015-06-06 LAB — CBC
HEMATOCRIT: 33.2 % — AB (ref 36.0–46.0)
HEMOGLOBIN: 10.2 g/dL — AB (ref 12.0–15.0)
MCH: 28.2 pg (ref 26.0–34.0)
MCHC: 30.7 g/dL (ref 30.0–36.0)
MCV: 91.7 fL (ref 78.0–100.0)
Platelets: 228 10*3/uL (ref 150–400)
RBC: 3.62 MIL/uL — AB (ref 3.87–5.11)
RDW: 14.2 % (ref 11.5–15.5)
WBC: 10 10*3/uL (ref 4.0–10.5)

## 2015-06-06 LAB — POCT ACTIVATED CLOTTING TIME: ACTIVATED CLOTTING TIME: 152 s

## 2015-06-06 MED ORDER — ASPIRIN 81 MG PO CHEW: 81.0000 mg | CHEWABLE_TABLET | ORAL | Status: DC

## 2015-06-06 MED ORDER — TICAGRELOR 90 MG PO TABS
90.0000 mg | ORAL_TABLET | Freq: Two times a day (BID) | ORAL | Status: DC
  Administered 2015-06-06 – 2015-06-08 (×5): 90 mg via ORAL
  Filled 2015-06-06 (×5): qty 1

## 2015-06-06 NOTE — Care Management Note (Signed)
Case Management Note  Patient Details  Name: Bea GraffDorothy S Harpole MRN: [id number] Date of Birth: October 22, 1927  Subjective/Objective:       Adm w pos cath             Action/Plan: lives alone, pcp dr Lawerance Bachkoirla   Expected Discharge Date:                  Expected Discharge Plan:  Home/Self Care  In-House Referral:     Discharge planning Services  CM Consult, Medication Assistance  Post Acute Care Choice:    Choice offered to:     DME Arranged:    DME Agency:     HH Arranged:    HH Agency:     Status of Service:     Medicare Important Message Given:    Date Medicare IM Given:    Medicare IM give by:    Date Additional Medicare IM Given:    Additional Medicare Important Message give by:     If discussed at Long Length of Stay Meetings, dates discussed:    Additional Comments: gave pt 30day free brilinta card. Has [facility] medicare ins.  Hanley Haysowell, Sharlena Kristensen T, RN 06/06/2015, 8:39 AM

## 2015-06-06 NOTE — Progress Notes (Signed)
Right femoral sheath D/C with ACT of 152 and those orders to pull sheath after ACT was less than 150 given by cardiologist fellow on call. Waited 30 minutes of obtaining ACT 6722f 150 and pulled sheath with 30 minutes of manual pressure held, site began as level 1 with site continuing to ooze. Site ended as level 0, pulses sbegan and ended as 1 being weak. Educated patient to keep leg straight and to report any S/S of bleeding and deomonstrated to hold pressure to site with any coughing or sneezing. Pressure dressing applied and will continue to monitor pt.closely.

## 2015-06-06 NOTE — Progress Notes (Signed)
Primary cardiologist: Dr. Zoila Shutter  Seen for followup: NSTEMI  Subjective:    Feels much better today, no chest pain or breathlessness at rest. Did not sleep well. No leg pain.  Objective:   Temp:  [97.6 F (36.4 C)-98.7 F (37.1 C)] 98 F (36.7 C) (05/29 0330) Pulse Rate:  [49-95] 58 (05/29 0600) Resp:  [0-33] 19 (05/29 0600) BP: (99-136)/(48-74) 114/48 mmHg (05/29 0600) SpO2:  [0 %-100 %] 96 % (05/29 0600) Arterial Line BP: (118-150)/(40-57) 118/40 mmHg (05/28 2300) FiO2 (%):  [2 %] 2 % (05/29 0400) Last BM Date: 06/04/15  Filed Weights   06/03/15 1735 06/04/15 0300 06/05/15 0641  Weight: 112 lb (50.803 kg) 112 lb 14.4 oz (51.211 kg) 113 lb 12.8 oz (51.619 kg)    Intake/Output Summary (Last 24 hours) at 06/06/15 0703 Last data filed at 06/05/15 2300  Gross per 24 hour  Intake 174.55 ml  Output    650 ml  Net -475.45 ml    Telemetry: Sinus rhythm.  Exam:  General: Pleasant elderly woman in no distress.  Lungs: Scattered coarse rhonchi, no wheezing.  Cardiac: RRR without gallop.  Abdomen: NABS.  Extremities: No pitting edema. Right femoral arteriotomy site without bleeding, very small hematoma.  Lab Results:  Basic Metabolic Panel:  Recent Labs Lab 06/03/15 1440 06/03/15 1615 06/04/15 0349  NA 138  --  138  K 4.7  --  3.9  CL 98*  --  101  CO2  --   --  30  GLUCOSE 156*  --  98  BUN 32*  --  29*  CREATININE 1.10*  --  1.23*  CALCIUM  --   --  8.8*  MG  --  2.0  --     Liver Function Tests:  Recent Labs Lab 06/03/15 1615  AST 29  ALT 18  ALKPHOS 66  BILITOT 0.8  PROT 7.0  ALBUMIN 3.7    CBC:  Recent Labs Lab 06/04/15 0349 06/05/15 0350 06/06/15 0244  WBC 9.6 12.0* 10.0  HGB 10.7* 11.6* 10.2*  HCT 34.0* 37.4 33.2*  MCV 90.9 91.9 91.7  PLT 246 253 228    Cardiac Enzymes:  Recent Labs Lab 06/03/15 2143 06/04/15 0350 06/05/15 0350  TROPONINI 2.40* 4.32* 8.30*    Coagulation:  Recent Labs Lab  06/03/15 1615  INR 1.08    ECG: Sinus rhythm with IVCD of left bundle branch block type, rule out old inferior infarct pattern.  Echocardiogram 06/04/2015: Study Conclusions  - Left ventricle: The cavity size was normal. Wall thickness was  normal. Systolic function was mildly reduced. The estimated  ejection fraction was in the range of 45% to 50%. Doppler  parameters are consistent with abnormal left ventricular  relaxation (grade 1 diastolic dysfunction). Doppler parameters  are consistent with high ventricular filling pressure. - Regional wall motion abnormality: Severe hypokinesis of the basal  inferior myocardium; moderate hypokinesis of the mid inferoseptal  and mid-apical inferior myocardium; mild hypokinesis of the  basal-mid inferolateral myocardium. - Aortic valve: Mildly to moderately calcified annulus. Trileaflet;  mildly thickened leaflets. At least mild aortic stenosis was  present. - Mitral valve: Moderately thickened leaflets . Moderate eccentric  regurgitation. - Left atrium: The atrium was moderately dilated.   Medications:   Scheduled Medications: . amLODipine  2.5 mg Oral q morning - 10a  . amoxicillin-clavulanate  1 tablet Oral BID  . aspirin EC  81 mg Oral Daily  . atorvastatin  40 mg Oral q1800  .  famotidine  20 mg Oral Daily  . hydrochlorothiazide  25 mg Oral q morning - 10a  . irbesartan  300 mg Oral q morning - 10a  . lactose free nutrition  237 mL Oral Q24H  . levothyroxine  88 mcg Oral QAC breakfast  . magnesium oxide  200 mg Oral Q1200  . methocarbamol  500 mg Oral BID  . multivitamin with minerals  1 tablet Oral Daily  . nebivolol  10 mg Oral Daily  . pantoprazole  40 mg Oral BID  . predniSONE  10 mg Oral Q breakfast    Infusions: . sodium chloride 10 mL/hr at 06/05/15 2000  . nitroGLYCERIN 2 mcg/min (06/05/15 1200)    PRN Medications: acetaminophen, albuterol, ALPRAZolam, dextromethorphan, nitroGLYCERIN, ondansetron  (ZOFRAN) IV, traMADol, zolpidem   Assessment:   1. NSTEMI, troponin I up to 8.3. Taken to cath lab yesterday with recurring chest pain on medical therapy.  2. CAD status post DES to distal RCA 5/28 by Dr. Allyson SabalBerry. Residual disease includes 60% ramus intermedius, and 75% small PLB. LVEF 45-50% by echocardiogram.  2. Essential hypertension. Blood pressure stable.  3. Hyperlipidemia.  4. Pulmonary fibrosis. Followed by Dr. Sherene SiresWert and on supplemental oxygen at home.  5. Recent URI, continues on steroid taper.   Plan/Discussion:    Transfer to telemetry. Continue medical therapy including ASA and Brilinta. Phase I cardiac rehabilitation. Likely discharge 5/30.  Jonelle SidleSamuel G. Endi Lagman, M.D., F.A.C.C.

## 2015-06-06 NOTE — Progress Notes (Signed)
Report called to Kathlene NovemberMike, RN on 2W. Will transport pt and belongings via WC to [id number].

## 2015-06-07 ENCOUNTER — Encounter (HOSPITAL_COMMUNITY)
Admission: EM | Disposition: A | Discharge status: Home / Self Care | Payer: Self-pay | Source: Home / Self Care | Attending: Internal Medicine

## 2015-06-07 ENCOUNTER — Encounter (HOSPITAL_COMMUNITY): Payer: Self-pay | Admitting: Cardiovascular Disease

## 2015-06-07 LAB — C DIFFICILE QUICK SCREEN W PCR REFLEX
C DIFFICILE (CDIFF) INTERP: NEGATIVE
C DIFFICLE (CDIFF) ANTIGEN: NEGATIVE
C Diff toxin: NEGATIVE

## 2015-06-07 LAB — POCT ACTIVATED CLOTTING TIME: ACTIVATED CLOTTING TIME: 342 s

## 2015-06-07 LAB — CBC
HCT: 31.8 % — ABNORMAL LOW (ref 36.0–46.0)
Hemoglobin: 9.9 g/dL — ABNORMAL LOW (ref 12.0–15.0)
MCH: 28.5 pg (ref 26.0–34.0)
MCHC: 31.1 g/dL (ref 30.0–36.0)
MCV: 91.6 fL (ref 78.0–100.0)
PLATELETS: 201 10*3/uL (ref 150–400)
RBC: 3.47 MIL/uL — AB (ref 3.87–5.11)
RDW: 14.2 % (ref 11.5–15.5)
WBC: 13.2 10*3/uL — AB (ref 4.0–10.5)

## 2015-06-07 SURGERY — LEFT HEART CATH AND CORONARY ANGIOGRAPHY
Anesthesia: LOCAL

## 2015-06-07 MED ORDER — PREDNISONE 5 MG PO TABS
5.0000 mg | ORAL_TABLET | Freq: Every day | ORAL | Status: DC
  Administered 2015-06-08: 5 mg via ORAL
  Filled 2015-06-07 (×2): qty 1

## 2015-06-07 NOTE — Telephone Encounter (Signed)
Left message for patient to call back  

## 2015-06-07 NOTE — Telephone Encounter (Signed)
Kelly callig back to let us know the patient is in the hosp she had a heart attack this is why she has not called back she is in 2 west room 32

## 2015-06-07 NOTE — Progress Notes (Signed)
Patient Name: Diana GraffDorothy S Swalley Date of Encounter: 06/07/2015  Principal Problem:   Angina at rest [facility](HCC) Active Problems:   HTN (hypertension)   Hypothyroidism   Pulmonary fibrosis (HCC)   Chronic respiratory failure with hypoxia (HCC)   Elevated troponin   Length of Stay: 4  SUBJECTIVE  No angina, dyspnea (more than baseline) or bleeding. BM x 5 since yesterday evening, now watery. Was on antibiotics before admission for suspected pneumonia (Augmentin).  CURRENT MEDS . amLODipine  2.5 mg Oral q morning - 10a  . amoxicillin-clavulanate  1 tablet Oral BID  . aspirin EC  81 mg Oral Daily  . atorvastatin  40 mg Oral q1800  . famotidine  20 mg Oral Daily  . hydrochlorothiazide  25 mg Oral q morning - 10a  . irbesartan  300 mg Oral q morning - 10a  . lactose free nutrition  237 mL Oral Q24H  . levothyroxine  88 mcg Oral QAC breakfast  . magnesium oxide  200 mg Oral Q1200  . methocarbamol  500 mg Oral BID  . multivitamin with minerals  1 tablet Oral Daily  . nebivolol  10 mg Oral Daily  . pantoprazole  40 mg Oral BID  . predniSONE  10 mg Oral Q breakfast  . ticagrelor  90 mg Oral BID    OBJECTIVE   Intake/Output Summary (Last 24 hours) at 06/07/15 0831 Last data filed at 06/06/15 1800  Gross per 24 hour  Intake    600 ml  Output      0 ml  Net    600 ml   Filed Weights   06/05/15 0641 06/06/15 0700 06/07/15 0526  Weight: 51.619 kg (113 lb 12.8 oz) 50.349 kg (111 lb) 51.665 kg (113 lb 14.4 oz)    PHYSICAL EXAM Filed Vitals:   06/06/15 0700 06/06/15 1219 06/06/15 2152 06/07/15 0526  BP: 104/50 117/56 113/44 113/44  Pulse:  65 64 61  Temp: 97.5 F (36.4 C)  98.1 F (36.7 C) 97.8 F (36.6 C)  TempSrc: Oral Oral Oral Oral  Resp:  19 18 18   Height:      Weight: 50.349 kg (111 lb)   51.665 kg (113 lb 14.4 oz)  SpO2: 98% 98% 99% 98%   General: Alert, oriented x3, no distress Head: no evidence of trauma, PERRL, EOMI, no exophtalmos or lid lag, no myxedema, no  xanthelasma; normal ears, nose and oropharynx Neck: normal jugular venous pulsations and no hepatojugular reflux; brisk carotid pulses without delay and no carotid bruits Chest: diffuse dry crackles, no signs of consolidation by percussion or palpation, normal fremitus, symmetrical and full respiratory excursions Cardiovascular: normal position and quality of the apical impulse, regular rhythm, normal first and paradoxically split second heart sounds, no rubs or gallops, no murmur Abdomen: no tenderness or distention, no masses by palpation, no abnormal pulsatility or arterial bruits, hyperactive bowel sounds, no hepatosplenomegaly Extremities: no clubbing, cyanosis or edema; 2+ radial, ulnar and brachial pulses bilaterally; 2+ right femoral, posterior tibial and dorsalis pedis pulses; 2+ left femoral, posterior tibial and dorsalis pedis pulses; no subclavian or femoral bruits Neurological: grossly nonfocal  LABS  CBC  Recent Labs  06/06/15 0244 06/07/15 0339  WBC 10.0 13.2*  HGB 10.2* 9.9*  HCT 33.2* 31.8*  MCV 91.7 91.6  PLT 228 201   Cardiac Enzymes  Recent Labs  06/05/15 0350  TROPONINI 8.30*     Radiology Studies Imaging results have been reviewed and No results found.  TELE NSR  ECG NSR, LBBB  ASSESSMENT AND PLAN   1. CAD with NSTEMI s/p DES to RCA 5/28. Reinforced need for DAPT without interruption for 12 months. Moderate residual disease in ramus intermedius and PLA for medical Rx.  2. Essential hypertension, controlled  3. Hyperlipidemia, now on atovastatin, recheck in 3 months.  4. Pulmonary fibrosis. Followed by Dr. Sherene Sires and on supplemental oxygen at home at night.  5. Recent URI, continues on steroid taper. Will reduce dose today due to possibility of C.diff  6. Diarrhea, high suspicion for C. Diff due to recent broad spectrum antibiotics. Check stool sample.  Thurmon Fair, MD, [facility] [phone] office [phone]  pager 06/07/2015 8:31 AM

## 2015-06-07 NOTE — Telephone Encounter (Signed)
Called pt daughter, [name], pt had a stent placed on Sunday and seems to be doing much better.  Aware that I will pass this along to Dr Sherene SiresWert as Lorain ChildesFYI that the patient is in the hospital.

## 2015-06-08 DIAGNOSIS — I214 Non-ST elevation (NSTEMI) myocardial infarction: Secondary | ICD-10-CM

## 2015-06-08 LAB — CBC
HEMATOCRIT: 31.2 % — AB (ref 36.0–46.0)
Hemoglobin: 9.7 g/dL — ABNORMAL LOW (ref 12.0–15.0)
MCH: 28.6 pg (ref 26.0–34.0)
MCHC: 31.1 g/dL (ref 30.0–36.0)
MCV: 92 fL (ref 78.0–100.0)
PLATELETS: 208 10*3/uL (ref 150–400)
RBC: 3.39 MIL/uL — ABNORMAL LOW (ref 3.87–5.11)
RDW: 14.2 % (ref 11.5–15.5)
WBC: 10.7 10*3/uL — AB (ref 4.0–10.5)

## 2015-06-08 MED ORDER — ATORVASTATIN CALCIUM 40 MG PO TABS: 40.0000 mg | ORAL_TABLET | Freq: Every day | ORAL | Status: AC

## 2015-06-08 MED ORDER — PREDNISONE 5 MG PO TABS: 5.0000 mg | ORAL_TABLET | Freq: Every day | ORAL | Status: DC

## 2015-06-08 MED ORDER — NITROGLYCERIN 0.4 MG SL SUBL: 0.4000 mg | SUBLINGUAL_TABLET | SUBLINGUAL | Status: AC | PRN

## 2015-06-08 MED ORDER — TICAGRELOR 90 MG PO TABS: 90.0000 mg | ORAL_TABLET | Freq: Two times a day (BID) | ORAL | Status: AC

## 2015-06-08 NOTE — Discharge Summary (Signed)
Discharge Summary    Patient ID: [name],  MRN: [id number], DOB/AGE: 03-22-1927 82 y.o.  Admit date: 06/03/2015 Discharge date: 06/08/2015  Primary Care Provider: Darrow Bauer Primary Cardiologist: Dr. Rennis Bauer  Discharge Diagnoses    Principal Problem:   NSTEMI (non-ST elevated myocardial infarction) [facility](HCC) Active Problems:   HTN (hypertension)   Hypothyroidism   Pulmonary fibrosis (HCC)   Chronic respiratory failure with hypoxia (HCC)   Elevated troponin   Angina at rest [facility](HCC)   Allergies No Known Allergies  Diagnostic Studies/Procedures   Cardiac Cath 5/28  Ramus lesion, 60% stenosed.  Post Atrio lesion, 75% stenosed.  Dist RCA lesion, 95% stenosed. Post intervention, there is a 0% residual stenosis. ____________  TTE 5/27: Study Conclusions  - Left ventricle: The cavity size was normal. Wall thickness was  normal. Systolic function was mildly reduced. The estimated  ejection fraction was in the range of 45% to 50%. Doppler  parameters are consistent with abnormal left ventricular  relaxation (grade 1 diastolic dysfunction). Doppler parameters  are consistent with high ventricular filling pressure. - Regional wall motion abnormality: Severe hypokinesis of the basal  inferior myocardium; moderate hypokinesis of the mid inferoseptal  and mid-apical inferior myocardium; mild hypokinesis of the  basal-mid inferolateral myocardium. - Aortic valve: Mildly to moderately calcified annulus. Trileaflet;  mildly thickened leaflets. At least mild aortic stenosis was  present. - Mitral valve: Moderately thickened leaflets . Moderate eccentric  regurgitation. - Left atrium: The atrium was moderately dilated.  History of Present Illness     82 year-old female with a history of knee surgery on the right, low back surgery, hypertension, hypothyroidism, mild dyslipidemia, pulmonary fibrosis and GERD. Had an echo 09/30/14 which showed a normal EF, but has  had right lower extremity swelling. She reports she thought she had prior stripping to the left greater saphenous vein graft in the past. She underwent arterial and venous Dopplers of the legs on December 11, 2011 which showed a slightly reduced ABI on the right with an increased velocity at the popliteal artery suggesting 50% reduction, however, normal ABI on the left. The venous Dopplers indicated no evidence of venous insufficiency and all of the visualized vessels were present, including both the left and right greater saphenous veins, suggesting that if they had performed prior venous stripping that perhaps she had an accessory vessel removed but it was not the greater saphenous vein. Overall there is no clear evidence to support venous ablation or other treatment. nuc study 2005 with no ischemia.  Over last 2 weeks has not felt just right -she saw Dr. Sherene Bauer 05/27/15 for cough, inc SOB, wheezing. Was placed on prednisone taper. Could not walk far without SOB. But last PM developed significant SOB and chest pain mid-sternal and into both sides of neck and into Lt medial arm. She sat up most of the night and did not rest much. Today it continued EMS was called after she called [name] office. Other than pressure and SOB she had some nausea. Currently feeling better. She is on home oxygen.   Troponin 1.03, EKG with LBBB and Lat. T wave changes. CXR pul. Fibrosis, cardiomegaly no acute changes.   Hospital Course     Consultants: Cardiology as primary  [name] is an 82 yo female with PMHx of HTN, Hypothyroidism, HLD, and pulmonary fibrosis who presented on 5/26 with NSTEMI. Cardiac cath revealed 95% stenosed distal RCA, now s/p DES with 0% residual stenosis.   CAD s/p DES to distal RCA:  Originally admitted with NSTEMI in the setting of ongoing anginal chest pain. Cardiac cath revealed 95% stenosed distal RCA, now s/p DES with 0% residual stenosis. Patient has done well since cardiac cath, no  recurrent chest pain or increased shortness of breath. Patient was discharged on ASA 81 mg daily, Brilinta 90 mg BID, atorvastatin 40 mg daily, consider increasing to 80 mg daily, irbesartan 300 mg daily, and nebivolol 10 mg daily.  Chronic Combined CHF: TTE on 5/27 showed EF of 45-50% and grade 1 diastolic dysfunction. Previous echo from 09/2014 showed EF of 50-55%. Patient was discharged on nebivolol 10 mg daily, HCTZ 25 mg daily, and irbesartan 300 mg daily.  HTN: Normotensive. Patient was discharged on amlodipine 2.5 mg daily, HCTZ 25 mg daily, irbesartan 300 mg daily, and nebivolol 10 mg daily.   HLD: Continue atorvastatin 40 mg daily, consider increase to 80 mg daily. Recheck in 3 months.  Pulmonary Fibrosis: Patient follows with Dr. Sherene Sires and wears O2 at night. Given recent URI, patient was started on Steroid taper. -Continue Prednisone 5 mg daily  Watery Diarrhea: Given recent broad spectrum antibiotics, C. Diff was tested for watery diarrhea. C diff toxin and antigen were both negative. Patient reports improvement and formed stools.  Hypothyroidism: Patient is on Levothyroxine 88 mcg daily at home which was continued.  _____________  Discharge Vitals Blood pressure 131/51, pulse 73, temperature 98 F (36.7 C), temperature source Oral, resp. rate 18, height 5\' 3"  (1.6 m), weight 47.6 kg (104 lb 15 oz), SpO2 100 %.  Filed Weights   06/06/15 0700 06/07/15 0526 06/08/15 0459  Weight: 50.349 kg (111 lb) 51.665 kg (113 lb 14.4 oz) 47.6 kg (104 lb 15 oz)    Labs & Radiologic Studies     CBC  Recent Labs  06/07/15 0339 06/08/15 0228  WBC 13.2* 10.7*  HGB 9.9* 9.7*  HCT 31.8* 31.2*  MCV 91.6 92.0  PLT 201 208   Dg Chest 2 View  06/03/2015  CLINICAL DATA:  Chest pain for 2 days EXAM: CHEST  2 VIEW COMPARISON:  05/17/2015 FINDINGS: There is hyperinflation of the lungs compatible with COPD. Diffuse fibrotic changes again noted throughout the lungs. Cardiomegaly. No overt edema. No  confluent opacities or effusions. Degenerative changes in the thoracic spine. IMPRESSION: COPD/fibrotic changes.  Cardiomegaly.  No active disease. Electronically Signed   By: Charlett Nose M.D.   On: 06/03/2015 15:25   Dg Chest 2 View  05/17/2015  CLINICAL DATA:  Productive cough for 2 weeks EXAM: CHEST  2 VIEW COMPARISON:  10/12/2014 FINDINGS: Cardiac shadow is stable. Diffuse fibrotic changes are again identified throughout both lungs relatively stable from the prior study. No new definitive infiltrate is seen. Degenerative change of the thoracic spine is noted as well as an S-shaped scoliosis also stable from the prior study. IMPRESSION: Diffuse fibrotic changes without definitive acute abnormality. Electronically Signed   By: Alcide Clever M.D.   On: 05/17/2015 14:30    Disposition   Pt is being discharged home today in good condition.  Follow-up Plans & Appointments   Follow-up Information    Follow up with Diana Bussing, MD On 06/17/2015.   Specialty:  Family Medicine   Why:  11:45 am   Contact information:   [address] [place] [address] [phone]       Follow up with Tereso Newcomer, PA-C On 06/24/2015.   Specialties:  Cardiology, Physician Assistant   Why:  11:45 am- at the [facility]  Office as listed   Contact information:   [address] [place] [address] [phone]      Discharge Instructions    Diet - low sodium heart healthy    Complete by:  As directed      Increase activity slowly    Complete by:  As directed            Discharge Medications   Current Discharge Medication List    START taking these medications   Details  atorvastatin (LIPITOR) 40 MG tablet Take 1 tablet (40 mg total) by mouth daily at 6 PM. Qty: 30 tablet, Refills: 12    nitroGLYCERIN (NITROSTAT) 0.4 MG SL tablet Place 1 tablet (0.4 mg total) under the tongue every 5 (five) minutes x 3 doses as needed for chest pain. Qty: 25 tablet,  Refills: 1    ticagrelor (BRILINTA) 90 MG TABS tablet Take 1 tablet (90 mg total) by mouth 2 (two) times daily. Qty: 60 tablet, Refills: 11      CONTINUE these medications which have CHANGED   Details  predniSONE (DELTASONE) 5 MG tablet Take 1 tablet (5 mg total) by mouth daily with breakfast. Qty: 30 tablet, Refills: 12      CONTINUE these medications which have NOT CHANGED   Details  albuterol (PROVENTIL HFA;VENTOLIN HFA) 108 (90 BASE) MCG/ACT inhaler Inhale 2 puffs into the lungs every 4 (four) hours as needed for wheezing or shortness of breath. For shortness of breath    albuterol (PROVENTIL) (5 MG/ML) 0.5% nebulizer solution Take 0.5 mL (2.5 mg total) by nebulization every 6 (six) hours as needed for wheezing. Qty: 20 mL, Refills: 12    amLODipine (NORVASC) 2.5 MG tablet Take 2.5 mg by mouth every morning.     aspirin EC 81 MG tablet Take 1 tablet (81 mg total) by mouth daily. Qty: 30 tablet, Refills: 0    Calcium Citrate (CALCITRATE PO) Take 600 mg by mouth daily.    chlorpheniramine (CHLOR-TRIMETON) 4 MG tablet Take 4 mg by mouth at bedtime.    dextromethorphan (DELSYM) 30 MG/5ML liquid Take 30 mg by mouth 2 (two) times daily as needed for cough. Take 2 tsp twice a day as needed for cough    diphenhydramine-acetaminophen (TYLENOL PM) 25-500 MG TABS Take 1-2 tablets by mouth at bedtime as needed (sleep).     famotidine (PEPCID) 20 MG tablet One at bedtime    hydrochlorothiazide (HYDRODIURIL) 25 MG tablet Take 25 mg by mouth every morning.    ipratropium (ATROVENT) 0.06 % nasal spray Place 2 sprays into both nostrils 4 (four) times daily. Qty: 15 mL, Refills: 12   Associated Diagnoses: Chronic rhinitis    irbesartan (AVAPRO) 300 MG tablet TAKE ONE TABLET BY MOUTH ONE TIME DAILY Qty: 30 tablet, Refills: 5    levothyroxine (SYNTHROID, LEVOTHROID) 88 MCG tablet Take 88 mcg by mouth daily before breakfast.    Magnesium 250 MG TABS Take 1 tablet by mouth every morning.       methocarbamol (ROBAXIN) 500 MG tablet Take 500 mg by mouth 2 (two) times daily.    naproxen sodium (ANAPROX) 220 MG tablet Take 220 mg by mouth 2 (two) times daily as needed (for pain). Take per bottle as needed for joint pain    nebivolol (BYSTOLIC) 10 MG tablet Take 1 tablet (10 mg total) by mouth daily. Qty: 30 tablet, Refills: 0    OXYGEN Inhale 2 L into the lungs daily as needed (for sleep and  with exertion). 2 l/m with sleep and exertion    pantoprazole (PROTONIX) 40 MG tablet Take 1 tablet (40 mg total) by mouth 2 (two) times daily. Qty: 60 tablet, Refills: 4    Polyethyl Glycol-Propyl Glycol (SYSTANE) 0.4-0.3 % SOLN Apply 1 drop to eye daily as needed (for dry eyes).    traMADol (ULTRAM) 50 MG tablet TAKE 1 TABLET BY MOUTH EVERY 4 HOURS FOR PAIN OR COUGH Qty: 60 tablet, Refills: 0      STOP taking these medications     amoxicillin-clavulanate (AUGMENTIN) 875-125 MG tablet      amoxicillin-clavulanate (AUGMENTIN) 875-125 MG tablet         Aspirin prescribed at discharge?  Yes High Intensity Statin Prescribed? (Lipitor 40-80mg  or Crestor 20-40mg ): Yes Beta Blocker Prescribed? Yes For EF 45% or less, Was ACEI/ARB Prescribed? Yes ADP Receptor Inhibitor Prescribed? (i.e. Plavix etc.-Includes Medically Managed Patients): Yes For EF <40%, Aldosterone Inhibitor Prescribed? No Was EF assessed during THIS hospitalization? Yes Was Cardiac Rehab II ordered? (Included Medically managed Patients): Yes   Outstanding Labs/Studies   None  Duration of Discharge Encounter   Greater than 30 minutes including physician time.  Antony Blackbird, DO PGY-2 Internal Medicine Resident Pager # [phone] 06/08/2015 12:27 PM

## 2015-06-08 NOTE — Care Management Important Message (Signed)
Important Message  Patient Details  Name: [name] Date of Birth: [birth year]   Medicare Important Message Given:  Yes    Elliot CousinShavis, Londa Mackowski Ellen, RN 06/08/2015, 12:38 PM

## 2015-06-08 NOTE — Progress Notes (Signed)
HISTORY: [name] is an 82 yo female with PMHx of HTN, Hypothyroidism, HLD, and pulmonary fibrosis who presented on 5/26 with complaint of shortness of breath and mid-sternal chest pain radiating to her neck and left arm. Troponin was elevated at 1.03 which trended up to 4.3 to 8.3 and EKG showed LBB with lateral TW changes. Patient was primarily placed on heparin gtt and nitro paste for NSTEMI. Patient underwent cardiac cath on 5/28 which revealed 60% stenosed ramus, 75% stenosed post artio and 95% stenosed distal RCA s/p DES with 0% residual stenosis.   SUBJECTIVE:  Patient was seen and examined this morning. Patient feels well- her shortness of breath is at baseline, no recurrent chest pain or lightheadedness. Diarrhea and nausea have improved.   OBJECTIVE:   Vitals:   Filed Vitals:   06/07/15 0526 06/07/15 1343 06/07/15 2013 06/08/15 0459  BP: 113/44 116/52 108/44 131/51  Pulse: 61 70 68 73  Temp: 97.8 F (36.6 C) 97.6 F (36.4 C) 99 F (37.2 C) 98 F (36.7 C)  TempSrc: Oral Oral Oral Oral  Resp: 18 18 17 18   Height:      Weight: 113 lb 14.4 oz (51.665 kg)   104 lb 15 oz (47.6 kg)  SpO2: 98% 98% 98% 100%   I&O's:    Intake/Output Summary (Last 24 hours) at 06/08/15 1052 Last data filed at 06/07/15 1700  Gross per 24 hour  Intake    480 ml  Output      0 ml  Net    480 ml   TELEMETRY: Reviewed telemetry pt in sinus rhythm  PHYSICAL EXAM Filed Vitals:   06/07/15 0526 06/07/15 1343 06/07/15 2013 06/08/15 0459  BP: 113/44 116/52 108/44 131/51  Pulse: 61 70 68 73  Temp: 97.8 F (36.6 C) 97.6 F (36.4 C) 99 F (37.2 C) 98 F (36.7 C)  TempSrc: Oral Oral Oral Oral  Resp: 18 18 17 18   Height:      Weight: 113 lb 14.4 oz (51.665 kg)   104 lb 15 oz (47.6 kg)  SpO2: 98% 98% 98% 100%   General: Vital signs reviewed.  Patient is elderly, thin, in no acute distress and cooperative with exam.  Cardiovascular: RRR, S1 normal, S2 normal. Pulmonary/Chest: Fine diffuse  inspiratory crackles Abdominal: Soft, non-tender, non-distended, BS + Extremities: No lower extremity edema bilaterally  LABS: CBC:  Recent Labs  06/07/15 0339 06/08/15 0228  WBC 13.2* 10.7*  HGB 9.9* 9.7*  HCT 31.8* 31.2*  MCV 91.6 92.0  PLT 201 208   Coag Panel:   Lab Results  Component Value Date   INR 1.08 06/03/2015   INR 0.98 08/17/2013   INR 1.08 08/23/2012    RADIOLOGY: Dg Chest 2 View  06/03/2015  CLINICAL DATA:  Chest pain for 2 days EXAM: CHEST  2 VIEW COMPARISON:  05/17/2015 FINDINGS: There is hyperinflation of the lungs compatible with COPD. Diffuse fibrotic changes again noted throughout the lungs. Cardiomegaly. No overt edema. No confluent opacities or effusions. Degenerative changes in the thoracic spine. IMPRESSION: COPD/fibrotic changes.  Cardiomegaly.  No active disease. Electronically Signed   By: Charlett NoseKevin  Dover M.D.   On: 06/03/2015 15:25   Dg Chest 2 View  05/17/2015  CLINICAL DATA:  Productive cough for 2 weeks EXAM: CHEST  2 VIEW COMPARISON:  10/12/2014 FINDINGS: Cardiac shadow is stable. Diffuse fibrotic changes are again identified throughout both lungs relatively stable from the prior study. No new definitive infiltrate is seen. Degenerative change of  the thoracic spine is noted as well as an S-shaped scoliosis also stable from the prior study. IMPRESSION: Diffuse fibrotic changes without definitive acute abnormality. Electronically Signed   By: Alcide Clever M.D.   On: 05/17/2015 14:30   ASSESSMENT/PLAN:   Ms. Bonello is an 82 yo female with PMHx of HTN, Hypothyroidism, HLD, and pulmonary fibrosis who presented on 5/26 with NSTEMI. Cardiac cath revealed 95% stenosed distal RCA, now s/p DES with 0% residual stenosis.   CAD s/p DES to distal RCA: Originally admitted with NSTEMI in the setting of ongoing anginal chest pain. Cardiac cath revealed 95% stenosed distal RCA, now s/p DES with 0% residual stenosis. Patient has done well since cardiac cath, no recurrent  chest pain or increased shortness of breath.  -ASA 81 mg daily -Brilinta 90 mg BID -Continue atorvastatin 40 mg daily, consider increasing to 80 mg daily -Continue irbesartan 300 mg daily -Continue nebivolol 10 mg daily  Chronic Combined CHF: TTE on 5/27 showed EF of 45-50% and grade 1 diastolic dysfunction. Previous echo from 09/2014 showed EF of 50-55%.  -Continue nebivolol 10 mg daily -Continue HCTZ 25 mg daily -Continue irbesartan 300 mg daily  HTN: Normotensive. -Continue amlodipine 2.5 mg daily -Continue HCTZ 25 mg daily -Continue irbesartan 300 mg daily -Continue nebivolol 10 mg daily   HLD:  -Continue atorvastatin 40 mg daily, consider increase to 80 mg daily -Recheck in 3 months  Pulmonary Fibrosis: Patient follows with Dr. Sherene Sires and wears O2 at night. Given recent URI, patient was started on Steroid taper. -Continue Prednisone 5 mg daily  Watery Diarrhea: Given recent broad spectrum antibiotics, C. Diff was tested yesterday for watery diarrhea. C diff toxin and antigen were both negative. Patient reports improvement and is now having formed stools. -Supportive treatment  Hypothyroidism: Patient is on Levothyroxine 88 mcg daily at home.  -Continue levothyroxine 88 mcg daily  DVT/PE ppx: SCDs CODE: FULL FEN: HH  Karlene Lineman, DO PGY-2 Internal Medicine Resident Pager # [phone] 06/08/2015 10:52 AM   I have seen and examined the patient along with Alexa Burns, DO.  I have reviewed the chart, notes and new data.  I agree with PA/NP's note.  Key new complaints: diarrhea has resolved, so has the cough. Key examination changes: no signs CHF Key new findings / data: C. Diff negative, diarrhea was probably Augmentin side effect  PLAN: DC home. Early follow up. Refer to cardiac/pulmonary rehab. Mandatory uninterrupted DAPT reviewed. Reevaluate LVEF/wall motion in a few months. Suspect the reduction in EF was mostly due to ischemic inferior wall stunning rather than  necrosis.  Thurmon Fair, MD, [facility] [phone] 06/08/2015, 12:24 PM

## 2015-06-23 NOTE — Progress Notes (Addendum)
Cardiology Office Note:    Date:  06/24/2015   ID:  [name], DOB Feb 22, 1927, MRN [id number]  PCP:  Darrow Bussing, MD  Cardiologist:  Dr. K. [place] Hilty   Electrophysiologist:  N/a Pulmonology: Dr. Sherene Sires GI: Dr. Matthias Hughs  Referring MD: Darrow Bussing, MD   Chief Complaint  Patient presents with  . Hospitalization Follow-up    s/p NSTEMI >> PCI to RCA with DES    History of Present Illness:     [name] is a 82 y.o. female with a hx of HTN, HL, GERD, COPD, pulmonary fibrosis last seen by Dr. Rennis Golden 11/16.    She was admitted 5/26-5/31 with a NSTEMI.  She presented with significant dyspnea and chest pain with associated left arm pain. Initial troponin was 1.03. Cardiac catheterization demonstrated 95% distal RCA stenosis that was treated with a Synergy DES. Patient was discharged on aspirin, Brilinta, atorvastatin 40, irbesartan 300, nebivolol 10. Echocardiogram demonstrated reduced LV function with an EF of 45-50% mild diastolic dysfunction. Beta blocker, ARB were continued. She did have watery diarrhea and her C. difficile was negative. She returns for post hospital follow-up.   She is here today with her daughter. Since discharge from the hospital, she has been doing well. She is chronically short of breath with her pulmonary fibrosis. This is unchanged. She denies recurrent chest symptoms. She denies orthopnea PND. She denies edema. She denies syncope. She denies any bleeding issues.   Past Medical History  Diagnosis Date  . COPD (chronic obstructive pulmonary disease) (HCC)   . Hyperlipemia   . Shortness of breath     with exertion  . Asthma   . Pneumonia     hx of frequent episodes of pneumonia  . Headache(784.0)     MIgraine- Visualchanges- flashing lights and cant focus and cant see.  Marland Kitchen GERD (gastroesophageal reflux disease)   . Arthritis   . Diverticulitis 2008  . Pulmonary fibrosis (HCC)   . Hypertension   . Cirrhosis of liver not due to alcohol (HCC)     . Hypothyroidism   . On home O2     2L N/C  . CAD (coronary artery disease) 06/24/2015    a   S/p NSTEMI 5/17 >> LHC - RI 60%, dRCA 95%, R post AV br 75% >> PCI: 2.75 x 24 mm Synergy DES to the distal RCA  . Ischemic cardiomyopathy 06/24/2015    a  S/p NSTEMI 5/17 //  b  Echo 06/04/15:  EF 45-50%, grade 1 diastolic dysfunction, severe inferior hypokinesis, moderate inferoseptal and apical inferior hypokinesis, mild inferolateral hypokinesis, at least mild aortic stenosis, moderate MR, moderate LAE  . Mitral regurgitation 06/24/2015    Mod eccentric MR by echo in 5/17  . History of non-ST elevation myocardial infarction (NSTEMI)     Past Surgical History  Procedure Laterality Date  . Eye surgery  05/2008    - bilateral  . Back surgery    . Total knee arthroplasty  11/2008  . Rotator cuff repair  2008    right  . Retinal tear repair cryotherapy    . Tonsillectomy    . Appendectomy    . Dilation and curettage of uterus    . Total knee arthroplasty  07/03/2011    Procedure: TOTAL KNEE ARTHROPLASTY;  Surgeon: Valeria Batman, MD;  Location: [facility] OR;  Service: Orthopedics;  Laterality: Right;  . Transthoracic echocardiogram  04/12/2011    EF>55%; mod concentric LVH; mild-mod TR  . Varicose vein  surgery    . Cardiac catheterization N/A 06/05/2015    Procedure: Left Heart Cath and Coronary Angiography;  Surgeon: Runell Gess, MD;  Location: [facility] INVASIVE CV LAB;  Service: Cardiovascular;  Laterality: N/A;  . Cardiac catheterization  06/05/2015    Procedure: Coronary Stent Intervention;  Surgeon: Runell Gess, MD;  Location: [facility] INVASIVE CV LAB;  Service: Cardiovascular;;    Current Medications: Outpatient Prescriptions Prior to Visit  Medication Sig Dispense Refill  . albuterol (PROVENTIL HFA;VENTOLIN HFA) 108 (90 BASE) MCG/ACT inhaler Inhale 2 puffs into the lungs every 4 (four) hours as needed for wheezing or shortness of breath. For shortness of breath    . amLODipine (NORVASC) 2.5 MG  tablet Take 2.5 mg by mouth every morning.     Marland Kitchen atorvastatin (LIPITOR) 40 MG tablet Take 1 tablet (40 mg total) by mouth daily at 6 PM. 30 tablet 12  . Calcium Citrate (CALCITRATE PO) Take 600 mg by mouth daily.    . chlorpheniramine (CHLOR-TRIMETON) 4 MG tablet Take 4 mg by mouth at bedtime.    Marland Kitchen dextromethorphan (DELSYM) 30 MG/5ML liquid Take 30 mg by mouth 2 (two) times daily as needed for cough. Take 2 tsp twice a day as needed for cough    . diphenhydramine-acetaminophen (TYLENOL PM) 25-500 MG TABS Take 1-2 tablets by mouth at bedtime as needed (sleep).     . hydrochlorothiazide (HYDRODIURIL) 25 MG tablet Take 25 mg by mouth every morning.    Marland Kitchen levothyroxine (SYNTHROID, LEVOTHROID) 88 MCG tablet Take 88 mcg by mouth daily before breakfast.    . Magnesium 250 MG TABS Take 1 tablet by mouth every morning.     . methocarbamol (ROBAXIN) 500 MG tablet Take 500 mg by mouth 2 (two) times daily.    . nitroGLYCERIN (NITROSTAT) 0.4 MG SL tablet Place 1 tablet (0.4 mg total) under the tongue every 5 (five) minutes x 3 doses as needed for chest pain. 25 tablet 1  . OXYGEN Inhale 2 L into the lungs daily as needed (for sleep and with exertion). 2 l/m with sleep and exertion    . Polyethyl Glycol-Propyl Glycol (SYSTANE) 0.4-0.3 % SOLN Place 1 drop into both eyes daily as needed (for dry eyes).     . predniSONE (DELTASONE) 5 MG tablet Take 1 tablet (5 mg total) by mouth daily with breakfast. 30 tablet 12  . ticagrelor (BRILINTA) 90 MG TABS tablet Take 1 tablet (90 mg total) by mouth 2 (two) times daily. 60 tablet 11  . traMADol (ULTRAM) 50 MG tablet TAKE 1 TABLET BY MOUTH EVERY 4 HOURS FOR PAIN OR COUGH 60 tablet 0  . ipratropium (ATROVENT) 0.06 % nasal spray Place 2 sprays into both nostrils 4 (four) times daily. (Patient taking differently: Place 1 spray into both nostrils 4 (four) times daily as needed for rhinitis. ) 15 mL 12  . irbesartan (AVAPRO) 300 MG tablet TAKE ONE TABLET BY MOUTH ONE TIME DAILY  (Patient taking differently: TAKE ONE TABLET BY MOUTH EVERY MORNING) 30 tablet 5  . nebivolol (BYSTOLIC) 10 MG tablet Take 1 tablet (10 mg total) by mouth daily. 30 tablet 0  . pantoprazole (PROTONIX) 40 MG tablet Take 1 tablet (40 mg total) by mouth 2 (two) times daily. (Patient taking differently: Take 40 mg by mouth 2 (two) times daily before a meal. ) 60 tablet 4  . albuterol (PROVENTIL) (5 MG/ML) 0.5% nebulizer solution Take 0.5 mL (2.5 mg total) by nebulization every 6 (six) hours as  needed for wheezing. (Patient not taking: Reported on 06/24/2015) 20 mL 12  . aspirin EC 81 MG tablet Take 1 tablet (81 mg total) by mouth daily. (Patient not taking: Reported on 06/24/2015) 30 tablet 0  . famotidine (PEPCID) 20 MG tablet One at bedtime (Patient not taking: Reported on 06/24/2015)    . naproxen sodium (ANAPROX) 220 MG tablet Take 220 mg by mouth 2 (two) times daily as needed (for pain). Reported on 06/24/2015     No facility-administered medications prior to visit.      Allergies:   Review of patient's allergies indicates no known allergies.   Social History   Social History  . Marital Status: Widowed    Spouse Name: N/A  . Number of Children: 5  . Years of Education: N/A   Social History Main Topics  . Smoking status: Former Smoker -- 1.5 years    Quit date: 01/09/1947  . Smokeless tobacco: Never Used     Comment: socially smoed x 1 1/2 years 04/29/13  . Alcohol Use: No  . Drug Use: No  . Sexual Activity: No   Other Topics Concern  . None   Social History Narrative     Family History:  The patient's family history includes Diabetes in her sister; Heart attack in her brother; Heart disease in her sister; Hyperlipidemia in her sister; Hypertension in her sister; Multiple sclerosis in her sister; Stroke in her maternal grandmother and sister.   ROS:   Please see the history of present illness.    Review of Systems  Cardiovascular: Positive for dyspnea on exertion.  Respiratory:  Positive for cough and wheezing.   Hematologic/Lymphatic: Bruises/bleeds easily.  Neurological: Positive for loss of balance.   All other systems reviewed and are negative.   Physical Exam:    VS:  BP 118/50 mmHg  Pulse 70  Ht  (1.6 m)  Wt 112 lb 2.6 oz (50.875 kg)  BMI 19.87 kg/m2  SpO2 95%   Physical Exam  Constitutional: She is oriented to person, place, and time. She appears well-developed and well-nourished.  HENT:  Head: Normocephalic.  Neck: Normal range of motion. No JVD present.  Cardiovascular: Normal rate, regular rhythm and normal heart sounds.   No murmur heard. Pulmonary/Chest: Effort normal.  Diffuse bilateral crackles noted, faint exp wheezing noted  Abdominal: Soft. There is no tenderness.  Musculoskeletal: She exhibits no edema.  R groin without hematoma or bruit    Neurological: She is alert and oriented to person, place, and time.  Skin: Skin is warm and dry.  Psychiatric: She has a normal mood and affect.    Wt Readings from Last 3 Encounters:  06/24/15 112 lb 2.6 oz (50.875 kg)  06/08/15 104 lb 15 oz (47.6 kg)  05/27/15 118 lb 6.4 oz (53.706 kg)      Studies/Labs Reviewed:     EKG:  EKG is  ordered today.  The ekg ordered today demonstrates NSR, HR 69, LAD, inf Q waves, TWI 2, 3, aVF, V5-6, IVCD, QTc 465 ms  Recent Labs: 06/03/2015: ALT 18; B Natriuretic Peptide 1294.0*; Magnesium 2.0; TSH 0.841 06/04/2015: BUN 29*; Creatinine, Ser 1.23*; Potassium 3.9; Sodium 138 06/08/2015: Hemoglobin 9.7*; Platelets 208   Recent Lipid Panel    Component Value Date/Time   CHOL 166 06/04/2015 0349   TRIG 60 06/04/2015 0349   HDL 72 06/04/2015 0349   CHOLHDL 2.3 06/04/2015 0349   VLDL 12 06/04/2015 0349   LDLCALC 82 06/04/2015 0349  Additional studies/ records that were reviewed today include:   LHC 06/05/15 RI 60% RCA distal 95%, R posterior AV branch 75% PCI: 2.75 x 24 mm Synergy DES to the distal RCA  Echo 06/04/15 - Left ventricle: The  cavity size was normal. Wall thickness was  normal. Systolic function was mildly reduced. The estimated  ejection fraction was in the range of 45% to 50%. Doppler  parameters are consistent with abnormal left ventricular  relaxation (grade 1 diastolic dysfunction). Doppler parameters  are consistent with high ventricular filling pressure. - Regional wall motion abnormality: Severe hypokinesis of the basal  inferior myocardium; moderate hypokinesis of the mid inferoseptal  and mid-apical inferior myocardium; mild hypokinesis of the  basal-mid inferolateral myocardium. - Aortic valve: Mildly to moderately calcified annulus. Trileaflet;  mildly thickened leaflets. At least mild aortic stenosis was  present. - Mitral valve: Moderately thickened leaflets . Moderate eccentric  regurgitation. - Left atrium: The atrium was moderately dilated.   ASSESSMENT:     1. NSTEMI (non-ST elevated myocardial infarction) (HCC)   2. Coronary artery disease involving native coronary artery of native heart without angina pectoris   3. Ischemic cardiomyopathy   4. Mitral regurgitation   5. Essential hypertension   6. HLD (hyperlipidemia)   7. Pulmonary fibrosis (HCC)     PLAN:     In order of problems listed above:  1. S/p NSTEMI - She is doing well.  No recurrent angina.  EF is 45-50% by echo. We discussed the importance of dual antiplatelet therapy. Continue ASA, Ticagrelor, Nebivolol, Irbesartan, Atorvastatin.  Refer to Cardiac rehab.  2. CAD - She has residual CAD with 60% RI and 75% R post AV branch.  Continue beta-blocker, statin, ASA, Brilinta.  Refer to Cardiac rehab as noted.  3. Ischemic CM - EF 45-50% by echo post MI.  Continue beta-blocker, angiotensin receptor blocker.  No evidence of volume excess.   4. MR - No sig murmur on exam.  ?ischemic MR.  Consider FU echo in 90 days.  5. HTN - Controlled on current regimen of Nebivolol, Irbesartan, HCTZ, Amlodipine.    6. HL - Mod  to high intensity statin Rx is indicated with recent ACS and PCI.  She does have a hx of non-alcoholic cirrhosis.  CT in 2015 demonstrated a nodular liver c/w cirrhosis.  She has seen Dr. Matthias HughsBuccini in the past and I have contacted him to discuss whether it is safe to continue her on Atorvastatin.  I would not increase her dose any at this point.  I will obtain LFTs today.    -  I did review with Dr. Matthias HughsBuccini.  It is safe for her to remain on statin Rx.  7. Pulmonary Fibrosis - FU with Pulmonology as planned.  I advised her to d/w cardiac rehab as they can likely help make recommendations re: +/- pulmonary rehab.    Medication Adjustments/Labs and Tests Ordered: Current medicines are reviewed at length with the patient today.  Concerns regarding medicines are outlined above.  Medication changes, Labs and Tests ordered today are outlined in the Patient Instructions noted below. Patient Instructions  Medication Instructions:  Your physician recommends that you continue on your current medications as directed. Please refer to the Current Medication list given to you today. Labwork: Your physician recommends that you have lab work today: Lft Testing/Procedures: -None Follow-Up: Your physician recommends that you keep your scheduled follow-up appointment with Dr. Georgeann OppenheimHilty You have been referred to Cardiac Rehab Any Other Special Instructions  Will Be Listed Below (If Applicable). Will call Dr. Donavan Burnet office to check on status of Lipitor If you need a refill on your cardiac medications before your next appointment, please call your pharmacy.   Signed, Tereso Newcomer, PA-C  06/24/2015 1:04 PM    [facility] [address] [place], [place], [place]  [address] Phone: [phone]; Fax: [phone]

## 2015-06-24 ENCOUNTER — Encounter: Payer: Self-pay | Admitting: Physician Assistant

## 2015-06-24 ENCOUNTER — Ambulatory Visit (INDEPENDENT_AMBULATORY_CARE_PROVIDER_SITE_OTHER): Payer: Medicare Other | Admitting: Physician Assistant

## 2015-06-24 VITALS — BP 118/50 | HR 70 | Ht 63.0 in | Wt 112.2 lb

## 2015-06-24 DIAGNOSIS — J8410 Pulmonary fibrosis, unspecified: Secondary | ICD-10-CM

## 2015-06-24 DIAGNOSIS — I255 Ischemic cardiomyopathy: Secondary | ICD-10-CM

## 2015-06-24 DIAGNOSIS — I340 Nonrheumatic mitral (valve) insufficiency: Secondary | ICD-10-CM

## 2015-06-24 DIAGNOSIS — E785 Hyperlipidemia, unspecified: Secondary | ICD-10-CM

## 2015-06-24 DIAGNOSIS — I2510 Atherosclerotic heart disease of native coronary artery without angina pectoris: Secondary | ICD-10-CM | POA: Diagnosis not present

## 2015-06-24 DIAGNOSIS — I252 Old myocardial infarction: Secondary | ICD-10-CM | POA: Insufficient documentation

## 2015-06-24 DIAGNOSIS — I10 Essential (primary) hypertension: Secondary | ICD-10-CM

## 2015-06-24 DIAGNOSIS — I214 Non-ST elevation (NSTEMI) myocardial infarction: Secondary | ICD-10-CM | POA: Diagnosis not present

## 2015-06-24 HISTORY — DX: Atherosclerotic heart disease of native coronary artery without angina pectoris: I25.10

## 2015-06-24 HISTORY — DX: Nonrheumatic mitral (valve) insufficiency: I34.0

## 2015-06-24 HISTORY — DX: Ischemic cardiomyopathy: I25.5

## 2015-06-24 LAB — HEPATIC FUNCTION PANEL
ALT: 19 U/L (ref 6–29)
AST: 22 U/L (ref 10–35)
Albumin: 3.8 g/dL (ref 3.6–5.1)
Alkaline Phosphatase: 67 U/L (ref 33–130)
BILIRUBIN DIRECT: 0.1 mg/dL (ref <=0.2)
BILIRUBIN INDIRECT: 0.5 mg/dL (ref 0.2–1.2)
BILIRUBIN TOTAL: 0.6 mg/dL (ref 0.2–1.2)
Total Protein: 6.4 g/dL (ref 6.1–8.1)

## 2015-06-24 MED ORDER — NEBIVOLOL HCL 10 MG PO TABS: 10.0000 mg | ORAL_TABLET | Freq: Every day | ORAL | Status: AC

## 2015-06-24 NOTE — Patient Instructions (Addendum)
Medication Instructions:  Your physician recommends that you continue on your current medications as directed. Please refer to the Current Medication list given to you today. Labwork: Your physician recommends that you have lab work today: Lft Testing/Procedures: -None Follow-Up: Your physician recommends that you keep your scheduled follow-up appointment with Dr. Georgeann OppenheimHilty You have been referred to Cardiac Rehab Any Other Special Instructions Will Be Listed Below (If Applicable). Will call Dr. Donavan BurnetBuccini's office to check on status of Lipitor If you need a refill on your cardiac medications before your next appointment, please call your pharmacy.

## 2015-06-30 ENCOUNTER — Telehealth: Payer: Self-pay | Admitting: Physician Assistant

## 2015-06-30 NOTE — Telephone Encounter (Signed)
Pt aware ok to take Lipitor per Dr. Matthias HughsBuccini and Tereso NewcomerScott Weaver, PA. Pt said thank you.

## 2015-06-30 NOTE — Telephone Encounter (Signed)
Please tell [name] that I did review with Dr. Matthias HughsBuccini on last Friday. He reviewed her chart and felt that it was safe for her to take the Lipitor (Atorvastatin). So, she should remain on Lipitor. Diana NewcomerScott Marasia Newhall, PA-C   06/30/2015 9:41 AM

## 2015-07-04 ENCOUNTER — Other Ambulatory Visit: Payer: Self-pay

## 2015-07-04 ENCOUNTER — Encounter (HOSPITAL_COMMUNITY): Payer: Self-pay | Admitting: Emergency Medicine

## 2015-07-04 ENCOUNTER — Emergency Department (HOSPITAL_COMMUNITY): Payer: Medicare Other

## 2015-07-04 ENCOUNTER — Inpatient Hospital Stay (HOSPITAL_COMMUNITY)
Admission: EM | Admit: 2015-07-04 | Discharge: 2015-07-09 | DRG: 871 | Disposition: A | Discharge status: Skilled Nursing Facility | Payer: Medicare Other | Attending: Internal Medicine | Admitting: Internal Medicine

## 2015-07-04 DIAGNOSIS — J9611 Chronic respiratory failure with hypoxia: Secondary | ICD-10-CM | POA: Diagnosis present

## 2015-07-04 DIAGNOSIS — B961 Klebsiella pneumoniae [K. pneumoniae] as the cause of diseases classified elsewhere: Secondary | ICD-10-CM | POA: Diagnosis present

## 2015-07-04 DIAGNOSIS — A419 Sepsis, unspecified organism: Secondary | ICD-10-CM | POA: Diagnosis present

## 2015-07-04 DIAGNOSIS — E43 Unspecified severe protein-calorie malnutrition: Secondary | ICD-10-CM | POA: Clinically undetermined

## 2015-07-04 DIAGNOSIS — I2510 Atherosclerotic heart disease of native coronary artery without angina pectoris: Secondary | ICD-10-CM | POA: Diagnosis present

## 2015-07-04 DIAGNOSIS — B9620 Unspecified Escherichia coli [E. coli] as the cause of diseases classified elsewhere: Secondary | ICD-10-CM | POA: Diagnosis present

## 2015-07-04 DIAGNOSIS — Z7902 Long term (current) use of antithrombotics/antiplatelets: Secondary | ICD-10-CM

## 2015-07-04 DIAGNOSIS — D696 Thrombocytopenia, unspecified: Secondary | ICD-10-CM | POA: Diagnosis present

## 2015-07-04 DIAGNOSIS — N183 Chronic kidney disease, stage 3 (moderate): Secondary | ICD-10-CM | POA: Diagnosis present

## 2015-07-04 DIAGNOSIS — R509 Fever, unspecified: Secondary | ICD-10-CM | POA: Diagnosis present

## 2015-07-04 DIAGNOSIS — E785 Hyperlipidemia, unspecified: Secondary | ICD-10-CM | POA: Diagnosis present

## 2015-07-04 DIAGNOSIS — R6520 Severe sepsis without septic shock: Secondary | ICD-10-CM | POA: Diagnosis present

## 2015-07-04 DIAGNOSIS — J440 Chronic obstructive pulmonary disease with acute lower respiratory infection: Secondary | ICD-10-CM | POA: Diagnosis present

## 2015-07-04 DIAGNOSIS — I129 Hypertensive chronic kidney disease with stage 1 through stage 4 chronic kidney disease, or unspecified chronic kidney disease: Secondary | ICD-10-CM | POA: Diagnosis present

## 2015-07-04 DIAGNOSIS — D638 Anemia in other chronic diseases classified elsewhere: Secondary | ICD-10-CM | POA: Diagnosis present

## 2015-07-04 DIAGNOSIS — N3001 Acute cystitis with hematuria: Secondary | ICD-10-CM | POA: Diagnosis not present

## 2015-07-04 DIAGNOSIS — I255 Ischemic cardiomyopathy: Secondary | ICD-10-CM | POA: Diagnosis present

## 2015-07-04 DIAGNOSIS — A4151 Sepsis due to Escherichia coli [E. coli]: Principal | ICD-10-CM | POA: Diagnosis present

## 2015-07-04 DIAGNOSIS — Z7982 Long term (current) use of aspirin: Secondary | ICD-10-CM

## 2015-07-04 DIAGNOSIS — N179 Acute kidney failure, unspecified: Secondary | ICD-10-CM | POA: Diagnosis present

## 2015-07-04 DIAGNOSIS — K219 Gastro-esophageal reflux disease without esophagitis: Secondary | ICD-10-CM | POA: Diagnosis present

## 2015-07-04 DIAGNOSIS — J189 Pneumonia, unspecified organism: Secondary | ICD-10-CM | POA: Diagnosis not present

## 2015-07-04 DIAGNOSIS — D6959 Other secondary thrombocytopenia: Secondary | ICD-10-CM | POA: Diagnosis present

## 2015-07-04 DIAGNOSIS — M1990 Unspecified osteoarthritis, unspecified site: Secondary | ICD-10-CM | POA: Diagnosis present

## 2015-07-04 DIAGNOSIS — I252 Old myocardial infarction: Secondary | ICD-10-CM

## 2015-07-04 DIAGNOSIS — J9620 Acute and chronic respiratory failure, unspecified whether with hypoxia or hypercapnia: Secondary | ICD-10-CM | POA: Diagnosis present

## 2015-07-04 DIAGNOSIS — J9621 Acute and chronic respiratory failure with hypoxia: Secondary | ICD-10-CM | POA: Diagnosis present

## 2015-07-04 DIAGNOSIS — J8410 Pulmonary fibrosis, unspecified: Secondary | ICD-10-CM | POA: Diagnosis present

## 2015-07-04 DIAGNOSIS — Z9981 Dependence on supplemental oxygen: Secondary | ICD-10-CM

## 2015-07-04 DIAGNOSIS — J441 Chronic obstructive pulmonary disease with (acute) exacerbation: Secondary | ICD-10-CM | POA: Diagnosis present

## 2015-07-04 DIAGNOSIS — J45909 Unspecified asthma, uncomplicated: Secondary | ICD-10-CM | POA: Diagnosis present

## 2015-07-04 DIAGNOSIS — R05 Cough: Secondary | ICD-10-CM

## 2015-07-04 DIAGNOSIS — N189 Chronic kidney disease, unspecified: Secondary | ICD-10-CM | POA: Diagnosis present

## 2015-07-04 DIAGNOSIS — Y95 Nosocomial condition: Secondary | ICD-10-CM | POA: Diagnosis present

## 2015-07-04 DIAGNOSIS — E039 Hypothyroidism, unspecified: Secondary | ICD-10-CM | POA: Diagnosis present

## 2015-07-04 DIAGNOSIS — E876 Hypokalemia: Secondary | ICD-10-CM | POA: Diagnosis present

## 2015-07-04 DIAGNOSIS — Z6821 Body mass index (BMI) 21.0-21.9, adult: Secondary | ICD-10-CM

## 2015-07-04 DIAGNOSIS — R059 Cough, unspecified: Secondary | ICD-10-CM

## 2015-07-04 DIAGNOSIS — K7460 Unspecified cirrhosis of liver: Secondary | ICD-10-CM | POA: Diagnosis present

## 2015-07-04 DIAGNOSIS — Z7952 Long term (current) use of systemic steroids: Secondary | ICD-10-CM

## 2015-07-04 DIAGNOSIS — Z87891 Personal history of nicotine dependence: Secondary | ICD-10-CM

## 2015-07-04 LAB — CBC WITH DIFFERENTIAL/PLATELET
BASOS PCT: 0 %
Basophils Absolute: 0 10*3/uL (ref 0.0–0.1)
EOS ABS: 0 10*3/uL (ref 0.0–0.7)
EOS PCT: 0 %
HCT: 32.6 % — ABNORMAL LOW (ref 36.0–46.0)
HEMOGLOBIN: 10.4 g/dL — AB (ref 12.0–15.0)
LYMPHS PCT: 4 %
Lymphs Abs: 0.6 10*3/uL — ABNORMAL LOW (ref 0.7–4.0)
MCH: 29.7 pg (ref 26.0–34.0)
MCHC: 31.9 g/dL (ref 30.0–36.0)
MCV: 93.1 fL (ref 78.0–100.0)
Monocytes Absolute: 1.4 10*3/uL — ABNORMAL HIGH (ref 0.1–1.0)
Monocytes Relative: 9 %
NEUTROS ABS: 14.1 10*3/uL — AB (ref 1.7–7.7)
Neutrophils Relative %: 87 %
Platelets: 122 10*3/uL — ABNORMAL LOW (ref 150–400)
RBC: 3.5 MIL/uL — ABNORMAL LOW (ref 3.87–5.11)
RDW: 14.6 % (ref 11.5–15.5)
WBC: 16.1 10*3/uL — ABNORMAL HIGH (ref 4.0–10.5)

## 2015-07-04 LAB — COMPREHENSIVE METABOLIC PANEL
ALBUMIN: 2.8 g/dL — AB (ref 3.5–5.0)
ALK PHOS: 83 U/L (ref 38–126)
ALT: 49 U/L (ref 14–54)
AST: 38 U/L (ref 15–41)
Anion gap: 8 (ref 5–15)
BILIRUBIN TOTAL: 1 mg/dL (ref 0.3–1.2)
BUN: 43 mg/dL — AB (ref 6–20)
CO2: 27 mmol/L (ref 22–32)
CREATININE: 1.55 mg/dL — AB (ref 0.44–1.00)
Calcium: 9 mg/dL (ref 8.9–10.3)
Chloride: 102 mmol/L (ref 101–111)
GFR calc Af Amer: 34 mL/min — ABNORMAL LOW (ref >60)
GFR, EST NON AFRICAN AMERICAN: 29 mL/min — AB (ref >60)
Glucose, Bld: 150 mg/dL — ABNORMAL HIGH (ref 65–99)
Potassium: 4 mmol/L (ref 3.5–5.1)
Sodium: 137 mmol/L (ref 135–145)
TOTAL PROTEIN: 5.6 g/dL — AB (ref 6.5–8.1)

## 2015-07-04 LAB — URINALYSIS, ROUTINE W REFLEX MICROSCOPIC
BILIRUBIN URINE: NEGATIVE
GLUCOSE, UA: NEGATIVE mg/dL
Ketones, ur: NEGATIVE mg/dL
Nitrite: NEGATIVE
PROTEIN: 30 mg/dL — AB
Specific Gravity, Urine: 1.022 (ref 1.005–1.030)
pH: 5.5 (ref 5.0–8.0)

## 2015-07-04 LAB — I-STAT CG4 LACTIC ACID, ED
LACTIC ACID, VENOUS: 2.68 mmol/L — AB (ref 0.5–2.0)
LACTIC ACID, VENOUS: 1.89 mmol/L (ref 0.5–2.0)

## 2015-07-04 LAB — URINE MICROSCOPIC-ADD ON

## 2015-07-04 LAB — STREP PNEUMONIAE URINARY ANTIGEN: STREP PNEUMO URINARY ANTIGEN: NEGATIVE

## 2015-07-04 LAB — PROCALCITONIN: Procalcitonin: 5.73 ng/mL

## 2015-07-04 MED ORDER — PANTOPRAZOLE SODIUM 40 MG PO TBEC
40.0000 mg | DELAYED_RELEASE_TABLET | Freq: Two times a day (BID) | ORAL | Status: DC
  Administered 2015-07-04 – 2015-07-09 (×10): 40 mg via ORAL
  Filled 2015-07-04 (×10): qty 1

## 2015-07-04 MED ORDER — POLYETHYL GLYCOL-PROPYL GLYCOL 0.4-0.3 % OP SOLN: 1.0000 [drp] | Freq: Every day | OPHTHALMIC | Status: DC | PRN

## 2015-07-04 MED ORDER — PREDNISONE 10 MG PO TABS
10.0000 mg | ORAL_TABLET | Freq: Every day | ORAL | Status: DC
  Administered 2015-07-05: 10 mg via ORAL
  Filled 2015-07-04: qty 1

## 2015-07-04 MED ORDER — IPRATROPIUM-ALBUTEROL 0.5-2.5 (3) MG/3ML IN SOLN: 3.0000 mL | RESPIRATORY_TRACT | Status: DC | PRN

## 2015-07-04 MED ORDER — VANCOMYCIN HCL IN DEXTROSE 1-5 GM/200ML-% IV SOLN
1000.0000 mg | Freq: Once | INTRAVENOUS | Status: AC
  Administered 2015-07-04: 1000 mg via INTRAVENOUS
  Filled 2015-07-04: qty 200

## 2015-07-04 MED ORDER — NEBIVOLOL HCL 10 MG PO TABS
10.0000 mg | ORAL_TABLET | Freq: Every day | ORAL | Status: DC
  Administered 2015-07-05 – 2015-07-09 (×5): 10 mg via ORAL
  Filled 2015-07-04: qty 4
  Filled 2015-07-04 (×3): qty 1
  Filled 2015-07-04: qty 4
  Filled 2015-07-04: qty 1
  Filled 2015-07-04: qty 4
  Filled 2015-07-04: qty 1
  Filled 2015-07-04 (×2): qty 4

## 2015-07-04 MED ORDER — LEVOTHYROXINE SODIUM 88 MCG PO TABS
88.0000 ug | ORAL_TABLET | Freq: Every day | ORAL | Status: DC
  Administered 2015-07-05 – 2015-07-09 (×5): 88 ug via ORAL
  Filled 2015-07-04 (×5): qty 1

## 2015-07-04 MED ORDER — IPRATROPIUM-ALBUTEROL 0.5-2.5 (3) MG/3ML IN SOLN
3.0000 mL | Freq: Three times a day (TID) | RESPIRATORY_TRACT | Status: DC
  Administered 2015-07-04 – 2015-07-09 (×15): 3 mL via RESPIRATORY_TRACT
  Filled 2015-07-04 (×15): qty 3

## 2015-07-04 MED ORDER — LEVOFLOXACIN IN D5W 500 MG/100ML IV SOLN
500.0000 mg | INTRAVENOUS | Status: DC
  Filled 2015-07-04: qty 100

## 2015-07-04 MED ORDER — DEXTROMETHORPHAN POLISTIREX ER 30 MG/5ML PO SUER
30.0000 mg | Freq: Two times a day (BID) | ORAL | Status: DC | PRN
  Administered 2015-07-04 – 2015-07-08 (×3): 30 mg via ORAL
  Filled 2015-07-04 (×5): qty 5

## 2015-07-04 MED ORDER — HEPARIN SODIUM (PORCINE) 5000 UNIT/ML IJ SOLN: 5000.0000 [IU] | Freq: Three times a day (TID) | INTRAMUSCULAR | Status: DC

## 2015-07-04 MED ORDER — FAMOTIDINE 20 MG PO TABS
20.0000 mg | ORAL_TABLET | Freq: Two times a day (BID) | ORAL | Status: DC
  Administered 2015-07-04 – 2015-07-05 (×3): 20 mg via ORAL
  Filled 2015-07-04 (×3): qty 1

## 2015-07-04 MED ORDER — LEVOFLOXACIN IN D5W 750 MG/150ML IV SOLN
750.0000 mg | Freq: Once | INTRAVENOUS | Status: AC
  Administered 2015-07-04: 750 mg via INTRAVENOUS
  Filled 2015-07-04: qty 150

## 2015-07-04 MED ORDER — DEXTROSE 5 % IV SOLN: 1.0000 g | Freq: Three times a day (TID) | INTRAVENOUS | Status: DC

## 2015-07-04 MED ORDER — TICAGRELOR 90 MG PO TABS
90.0000 mg | ORAL_TABLET | Freq: Two times a day (BID) | ORAL | Status: DC
  Administered 2015-07-04 – 2015-07-09 (×10): 90 mg via ORAL
  Filled 2015-07-04 (×10): qty 1

## 2015-07-04 MED ORDER — CEFEPIME HCL 1 G IJ SOLR
1.0000 g | Freq: Once | INTRAMUSCULAR | Status: AC
  Administered 2015-07-04: 1 g via INTRAVENOUS
  Filled 2015-07-04: qty 1

## 2015-07-04 MED ORDER — SODIUM CHLORIDE 0.9 % IV SOLN
INTRAVENOUS | Status: DC
  Administered 2015-07-04: 75 mL via INTRAVENOUS

## 2015-07-04 MED ORDER — HYDROCHLOROTHIAZIDE 25 MG PO TABS
25.0000 mg | ORAL_TABLET | Freq: Every morning | ORAL | Status: DC
  Administered 2015-07-05 – 2015-07-09 (×5): 25 mg via ORAL
  Filled 2015-07-04 (×5): qty 1

## 2015-07-04 MED ORDER — ASPIRIN 81 MG PO TABS: 81.0000 mg | ORAL_TABLET | Freq: Every day | ORAL | Status: DC

## 2015-07-04 MED ORDER — IRBESARTAN 300 MG PO TABS
300.0000 mg | ORAL_TABLET | Freq: Every morning | ORAL | Status: DC
  Administered 2015-07-05 – 2015-07-09 (×5): 300 mg via ORAL
  Filled 2015-07-04 (×5): qty 1

## 2015-07-04 MED ORDER — SODIUM CHLORIDE 0.9 % IV BOLUS (SEPSIS)
30.0000 mL/kg | Freq: Once | INTRAVENOUS | Status: AC
  Administered 2015-07-04: 1527 mL via INTRAVENOUS

## 2015-07-04 MED ORDER — ASPIRIN EC 81 MG PO TBEC
81.0000 mg | DELAYED_RELEASE_TABLET | Freq: Every day | ORAL | Status: DC
  Administered 2015-07-05 – 2015-07-09 (×5): 81 mg via ORAL
  Filled 2015-07-04 (×5): qty 1

## 2015-07-04 MED ORDER — ACETAMINOPHEN 325 MG PO TABS
650.0000 mg | ORAL_TABLET | Freq: Once | ORAL | Status: AC | PRN
  Administered 2015-07-04: 650 mg via ORAL
  Filled 2015-07-04: qty 2

## 2015-07-04 MED ORDER — VANCOMYCIN HCL 500 MG IV SOLR: 500.0000 mg | INTRAVENOUS | Status: DC

## 2015-07-04 MED ORDER — AMLODIPINE BESYLATE 2.5 MG PO TABS
2.5000 mg | ORAL_TABLET | Freq: Every morning | ORAL | Status: DC
  Administered 2015-07-05 – 2015-07-09 (×5): 2.5 mg via ORAL
  Filled 2015-07-04 (×5): qty 1

## 2015-07-04 MED ORDER — ATORVASTATIN CALCIUM 40 MG PO TABS
40.0000 mg | ORAL_TABLET | Freq: Every day | ORAL | Status: DC
  Administered 2015-07-04 – 2015-07-08 (×5): 40 mg via ORAL
  Filled 2015-07-04 (×5): qty 1

## 2015-07-04 MED ORDER — MAGNESIUM 250 MG PO TABS: 1.0000 | ORAL_TABLET | Freq: Every morning | ORAL | Status: DC

## 2015-07-04 MED ORDER — POLYVINYL ALCOHOL 1.4 % OP SOLN
2.0000 [drp] | OPHTHALMIC | Status: DC | PRN
  Filled 2015-07-04: qty 15

## 2015-07-04 NOTE — ED Notes (Signed)
Pt states she has had a productive cough for 2 weeks and started having chills last night with sob when walking. Pt denies any sob while laying in bed mostly with exertion. Pt denies any chest pain.

## 2015-07-04 NOTE — Progress Notes (Signed)
Pharmacy Antibiotic Note  [name] is a 82 y.o. female admitted on 07/04/2015 with pneumonia.  She was recently admitted from 06/03/15-06/08/15 with an NSTEMI and received a DES to the distal RCA. She presents with productive cough and fever to 101.9 at home (100.4 in ED). Pharmacy has been consulted for vancomycin dosing.  Plan: --Vancomycin 1000 mg IV x1, then 500 mg IV q24h x8 days per consult request --goal vancomycin trough 15-20 mcg/mL --Cefepime 1g x1     Temp (24hrs), Avg:100.4 F (38 C), Min:100.4 F (38 C), Max:100.4 F (38 C)   Recent Labs Lab 07/04/15 1248 07/04/15 1316  CREATININE 1.55*  --   LATICACIDVEN  --  2.68*    Estimated Creatinine Clearance: 20.9 mL/min (by C-G formula based on Cr of 1.55).    No Known Allergies  Antimicrobials this admission: cefepime 6/26 x1 Vancomycin 6/25 >>   Dose adjustments this admission: n/a  Microbiology results: pending  Thank you for allowing pharmacy to be a part of this patient's care.  [name], PharmD Clinical Pharmacy Resident Pager: [phone] 07/04/2015 2:53 PM

## 2015-07-04 NOTE — Progress Notes (Signed)
ANTIBIOTIC CONSULT NOTE - INITIAL  Pharmacy Consult for levaquin Indication: pneumonia  No Known Allergies  Patient Measurements:   Adjusted Body Weight:   Vital Signs: Temp: 98.6 F (37 C) (06/26 1508) Temp Source: Oral (06/26 1508) BP: 114/56 mmHg (06/26 1700) Pulse Rate: 74 (06/26 1700) Intake/Output from previous day:   Intake/Output from this shift: Total I/O In: 1750 [I.V.:1500; IV Piggyback:250] Out: -   Labs:  Recent Labs  07/04/15 1248  WBC 16.1*  HGB 10.4*  PLT 122*  CREATININE 1.55*   Estimated Creatinine Clearance: 20.9 mL/min (by C-G formula based on Cr of 1.55). No results for input(s): VANCOTROUGH, VANCOPEAK, VANCORANDOM, GENTTROUGH, GENTPEAK, GENTRANDOM, TOBRATROUGH, TOBRAPEAK, TOBRARND, AMIKACINPEAK, AMIKACINTROU, AMIKACIN in the last 72 hours.   Microbiology: Recent Results (from the past 720 hour(s))  C difficile quick scan w PCR reflex     Status: None   Collection Time: 06/07/15  8:16 PM  Result Value Ref Range Status   C Diff antigen NEGATIVE NEGATIVE Final   C Diff toxin NEGATIVE NEGATIVE Final   C Diff interpretation Negative for toxigenic C. difficile  Final    Medical History: Past Medical History  Diagnosis Date  . COPD (chronic obstructive pulmonary disease) (HCC)   . Hyperlipemia     a. reviewed with Dr. Matthias HughsBuccini 6/17 >> ok to take statin as cirrhosis is not advanced   . Shortness of breath     with exertion  . Asthma   . Pneumonia     hx of frequent episodes of pneumonia  . Headache(784.0)     MIgraine- Visualchanges- flashing lights and cant focus and cant see.  Marland Kitchen. GERD (gastroesophageal reflux disease)   . Arthritis   . Diverticulitis 2008  . Pulmonary fibrosis (HCC)   . Hypertension   . Cirrhosis of liver not due to alcohol (HCC)   . Hypothyroidism   . On home O2     2L N/C  . CAD (coronary artery disease) 06/24/2015    a   S/p NSTEMI 5/17 >> LHC - RI 60%, dRCA 95%, R post AV br 75% >> PCI: 2.75 x 24 mm Synergy DES to  the distal RCA  . Ischemic cardiomyopathy 06/24/2015    a  S/p NSTEMI 5/17 //  b  Echo 06/04/15:  EF 45-50%, grade 1 diastolic dysfunction, severe inferior hypokinesis, moderate inferoseptal and apical inferior hypokinesis, mild inferolateral hypokinesis, at least mild aortic stenosis, moderate MR, moderate LAE  . Mitral regurgitation 06/24/2015    Mod eccentric MR by echo in 5/17  . History of non-ST elevation myocardial infarction (NSTEMI)     Medications:  Scheduled:  . [START ON 07/05/2015] amLODipine  2.5 mg Oral q morning - 10a  . [START ON 07/05/2015] aspirin EC  81 mg Oral Daily  . atorvastatin  40 mg Oral q1800  . famotidine  20 mg Oral BID  . [START ON 07/05/2015] hydrochlorothiazide  25 mg Oral q morning - 10a  . [START ON 07/05/2015] irbesartan  300 mg Oral q morning - 10a  . [START ON 07/05/2015] levothyroxine  88 mcg Oral QAC breakfast  . [START ON 07/05/2015] Magnesium  1 tablet Oral q morning - 10a  . [START ON 07/05/2015] nebivolol  10 mg Oral Daily  . pantoprazole  40 mg Oral BID AC  . [START ON 07/05/2015] predniSONE  10 mg Oral Q breakfast  . ticagrelor  90 mg Oral BID   Infusions:  . sodium chloride     Assessment: 82  yo female with HCAP will be started on levaquin.  Patient has hx of CKD and now SCr is 1.55 (CrCl ~21)  Goal of Therapy:  Resolution of infection  Plan:  - levaquin 750 mg iv x1, then 500 mg iv q48h - monitor renal function  Bev Drennen, Tsz-Yin 07/04/2015,6:07 PM

## 2015-07-04 NOTE — ED Provider Notes (Signed)
CSN: [id number]     Arrival date & time 07/04/15  1228 History   First MD Initiated Contact with Patient 07/04/15 1237     Chief Complaint  Patient presents with  . Cough  . Fever     (Consider location/radiation/quality/duration/timing/severity/associated sxs/prior Treatment) HPI [name] is a 82 y.o. female history of pulmonary fibrosis, known coronary artery disease status post stent placement roughly 1 month ago, here for evaluation of cough and fever. Patient accompanied by daughter at bedside who contributes to history of present illness. Patient states she has had subjective fevers at night over the past weekend, most recently 101.9 last night. Also reports associated chills at night. She has taken 81 mg aspirin without relief of her symptoms. She reports a clear to yellow productive sputum, no overt shortness of breath or chest pain, no unusual leg swelling. She denies any abdominal or flank pain, urinary symptoms, diarrhea or constipation, back pain.  Past Medical History  Diagnosis Date  . COPD (chronic obstructive pulmonary disease) (HCC)   . Hyperlipemia     a. reviewed with Dr. Matthias HughsBuccini 6/17 >> ok to take statin as cirrhosis is not advanced   . Shortness of breath     with exertion  . Asthma   . Pneumonia     hx of frequent episodes of pneumonia  . Headache(784.0)     MIgraine- Visualchanges- flashing lights and cant focus and cant see.  Marland Kitchen. GERD (gastroesophageal reflux disease)   . Arthritis   . Diverticulitis 2008  . Pulmonary fibrosis (HCC)   . Hypertension   . Cirrhosis of liver not due to alcohol (HCC)   . Hypothyroidism   . On home O2     2L N/C  . CAD (coronary artery disease) 06/24/2015    a   S/p NSTEMI 5/17 >> LHC - RI 60%, dRCA 95%, R post AV br 75% >> PCI: 2.75 x 24 mm Synergy DES to the distal RCA  . Ischemic cardiomyopathy 06/24/2015    a  S/p NSTEMI 5/17 //  b  Echo 06/04/15:  EF 45-50%, grade 1 diastolic dysfunction, severe inferior hypokinesis,  moderate inferoseptal and apical inferior hypokinesis, mild inferolateral hypokinesis, at least mild aortic stenosis, moderate MR, moderate LAE  . Mitral regurgitation 06/24/2015    Mod eccentric MR by echo in 5/17  . History of non-ST elevation myocardial infarction (NSTEMI)    Past Surgical History  Procedure Laterality Date  . Eye surgery  05/2008    - bilateral  . Back surgery    . Total knee arthroplasty  11/2008  . Rotator cuff repair  2008    right  . Retinal tear repair cryotherapy    . Tonsillectomy    . Appendectomy    . Dilation and curettage of uterus    . Total knee arthroplasty  07/03/2011    Procedure: TOTAL KNEE ARTHROPLASTY;  Surgeon: Valeria BatmanPeter W Whitfield, MD;  [facility];  Service: Orthopedics;  Laterality: Right;  . Transthoracic echocardiogram  04/12/2011    EF>55%; mod concentric LVH; mild-mod TR  . Varicose vein surgery    . Cardiac catheterization N/A 06/05/2015    Procedure: Left Heart Cath and Coronary Angiography;  Surgeon: Runell GessJonathan J Berry, MD;  [facility];  Service: Cardiovascular;  Laterality: N/A;  . Cardiac catheterization  06/05/2015    Procedure: Coronary Stent Intervention;  Surgeon: Runell GessJonathan J Berry, MD;  [facility];  Service: Cardiovascular;;   Family History  Problem Relation Age of Onset  . Multiple sclerosis Sister   . Diabetes Sister   . Heart disease Sister   . Heart attack Brother   . Stroke Maternal Grandmother   . Hypertension Sister   . Hyperlipidemia Sister   . Stroke Sister    Social History  Substance Use Topics  . Smoking status: Former Smoker -- 1.5 years    Quit date: 01/09/1947  . Smokeless tobacco: Never Used     Comment: socially smoed x 1 1/2 years 04/29/13  . Alcohol Use: No   OB History    No data available     Review of Systems A 10 point review of systems was completed and was negative except for pertinent positives and negatives as mentioned in the history of present illness      Allergies  Review of patient's allergies indicates no known allergies.  Home Medications   Prior to Admission medications   Medication Sig Start Date End Date Taking? Authorizing Provider  albuterol (PROVENTIL HFA;VENTOLIN HFA) 108 (90 BASE) MCG/ACT inhaler Inhale 2 puffs into the lungs every 4 (four) hours as needed for wheezing or shortness of breath. For shortness of breath   Yes Historical Provider, MD  albuterol (PROVENTIL) (5 MG/ML) 0.5% nebulizer solution Take 2.5 mg by nebulization every 4 (four) hours as needed for wheezing or shortness of breath.   Yes Historical Provider, MD  amLODipine (NORVASC) 2.5 MG tablet Take 2.5 mg by mouth every morning.    Yes Historical Provider, MD  aspirin 81 MG tablet Take 81 mg by mouth daily.   Yes Historical Provider, MD  atorvastatin (LIPITOR) 40 MG tablet Take 1 tablet (40 mg total) by mouth daily at 6 PM. 06/08/15  Yes Little IshikawaErin E Smith, NP  Calcium Citrate (CALCITRATE PO) Take 600 mg by mouth daily.   Yes Historical Provider, MD  chlorpheniramine (CHLOR-TRIMETON) 4 MG tablet Take 4 mg by mouth at bedtime.   Yes Historical Provider, MD  dextromethorphan (DELSYM) 30 MG/5ML liquid Take 30 mg by mouth 2 (two) times daily as needed for cough. Take 2 tsp twice a day as needed for cough   Yes Historical Provider, MD  diphenhydramine-acetaminophen (TYLENOL PM) 25-500 MG TABS Take 1-2 tablets by mouth at bedtime as needed (sleep).    Yes Historical Provider, MD  famotidine (PEPCID) 20 MG tablet Take 20 mg by mouth 2 (two) times daily.   Yes Historical Provider, MD  hydrochlorothiazide (HYDRODIURIL) 25 MG tablet Take 25 mg by mouth every morning.   Yes Historical Provider, MD  ipratropium (ATROVENT) 0.06 % nasal spray Place 2 sprays into both nostrils 4 (four) times daily as needed for rhinitis.   Yes Historical Provider, MD  irbesartan (AVAPRO) 300 MG tablet Take 300 mg by mouth every morning.   Yes Historical Provider, MD  levothyroxine (SYNTHROID,  LEVOTHROID) 88 MCG tablet Take 88 mcg by mouth daily before breakfast.   Yes Historical Provider, MD  Magnesium 250 MG TABS Take 1 tablet by mouth every morning.    Yes Historical Provider, MD  methocarbamol (ROBAXIN) 500 MG tablet Take 500 mg by mouth 2 (two) times daily.   Yes Historical Provider, MD  nebivolol (BYSTOLIC) 10 MG tablet Take 1 tablet (10 mg total) by mouth daily. 06/24/15  Yes Scott Moishe Spice Weaver, PA-C  nitroGLYCERIN (NITROSTAT) 0.4 MG SL tablet Place 1 tablet (0.4 mg total) under the tongue every 5 (five) minutes x 3 doses as needed for chest pain. 06/08/15  Yes Denny PeonErin  Candy Sledge, NP  OXYGEN Inhale 2 L into the lungs daily as needed (for sleep and with exertion). 2 l/m with sleep and exertion   Yes Historical Provider, MD  pantoprazole (PROTONIX) 40 MG tablet Take 40 mg by mouth 2 (two) times daily before a meal.   Yes Historical Provider, MD  Polyethyl Glycol-Propyl Glycol (SYSTANE) 0.4-0.3 % SOLN Place 1 drop into both eyes daily as needed (for dry eyes).    Yes Historical Provider, MD  predniSONE (DELTASONE) 10 MG tablet TAKE 2 TABLETS BY MOUTH IN THE  MORNING UNTIL BETTER THEN 1 TABLET DAILY 05/27/15  Yes Historical Provider, MD  predniSONE (DELTASONE) 5 MG tablet Take 1 tablet (5 mg total) by mouth daily with breakfast. 06/08/15  Yes Little Ishikawa, NP  ticagrelor (BRILINTA) 90 MG TABS tablet Take 1 tablet (90 mg total) by mouth 2 (two) times daily. 06/08/15  Yes Alexa Lucrezia Starch, MD  traMADol (ULTRAM) 50 MG tablet TAKE 1 TABLET BY MOUTH EVERY 4 HOURS FOR PAIN OR COUGH 05/09/15  Yes Nyoka Cowden, MD  amoxicillin-clavulanate (AUGMENTIN) 875-125 MG tablet Reported on 07/04/2015 05/30/15   Historical Provider, MD   BP 114/56 mmHg  Pulse 74  Temp(Src) 98.6 F (37 C) (Oral)  Resp 23  SpO2 98% Physical Exam  Constitutional: She is oriented to person, place, and time. She appears well-developed and well-nourished.  HENT:  Head: Normocephalic and atraumatic.  Mouth/Throat: Oropharynx is clear and  moist.  Eyes: Conjunctivae are normal. Pupils are equal, round, and reactive to light. Right eye exhibits no discharge. Left eye exhibits no discharge. No scleral icterus.  Neck: Neck supple.  Cardiovascular: Normal rate, regular rhythm and normal heart sounds.   Pulmonary/Chest: Effort normal. No respiratory distress.  Crackles diffusely in all fields. No other obvious adventitious lung sounds. Oxygen saturations 98% on room air.  Abdominal: Soft. There is no tenderness.  Musculoskeletal: She exhibits no tenderness.  Neurological: She is alert and oriented to person, place, and time.  Cranial Nerves II-XII grossly intact  Skin: Skin is warm and dry. No rash noted.  Psychiatric: She has a normal mood and affect.  Nursing note and vitals reviewed.   ED Course  Procedures (including critical care time) Labs Review Labs Reviewed  COMPREHENSIVE METABOLIC PANEL - Abnormal; Notable for the following:    Glucose, Bld 150 (*)    BUN 43 (*)    Creatinine, Ser 1.55 (*)    Total Protein 5.6 (*)    Albumin 2.8 (*)    GFR calc non Af Amer 29 (*)    GFR calc Af Amer 34 (*)    All other components within normal limits  URINALYSIS, ROUTINE W REFLEX MICROSCOPIC (NOT AT [facility]) - Abnormal; Notable for the following:    APPearance CLOUDY (*)    Hgb urine dipstick SMALL (*)    Protein, ur 30 (*)    Leukocytes, UA MODERATE (*)    All other components within normal limits  CBC WITH DIFFERENTIAL/PLATELET - Abnormal; Notable for the following:    WBC 16.1 (*)    RBC 3.50 (*)    Hemoglobin 10.4 (*)    HCT 32.6 (*)    Platelets 122 (*)    Neutro Abs 14.1 (*)    Lymphs Abs 0.6 (*)    Monocytes Absolute 1.4 (*)    All other components within normal limits  URINE MICROSCOPIC-ADD ON - Abnormal; Notable for the following:    Squamous Epithelial / LPF 6-30 (*)  Bacteria, UA MANY (*)    All other components within normal limits  I-STAT CG4 LACTIC ACID, ED - Abnormal; Notable for the following:     Lactic Acid, Venous 2.68 (*)    All other components within normal limits  URINE CULTURE  CULTURE, BLOOD (ROUTINE X 2)  CULTURE, BLOOD (ROUTINE X 2)  CULTURE, EXPECTORATED SPUTUM-ASSESSMENT  STREP PNEUMONIAE URINARY ANTIGEN  LEGIONELLA PNEUMOPHILA SEROGP 1 UR AG  COMPREHENSIVE METABOLIC PANEL  CBC  I-STAT CG4 LACTIC ACID, ED  I-STAT CG4 LACTIC ACID, ED  I-STAT CG4 LACTIC ACID, ED    Imaging Review Dg Chest 2 View  07/04/2015  CLINICAL DATA:  Shortness of breath. EXAM: CHEST  2 VIEW COMPARISON:  Radiographs dated 06/03/2015, 10/12/2014 and 07/14/2014 and chest CT dated 09/29/2014 FINDINGS: The patient has severe chronic interstitial and obstructive lung disease with multiple areas of parenchymal scarring bilaterally which are stable. No acute infiltrates or effusions. Diffuse osteopenia with straightening of the thoracic kyphosis. Aortic atherosclerosis. Chronic thoracolumbar scoliosis. No acute bone abnormality. IMPRESSION: No acute abnormality.  Severe chronic lung disease. Electronically Signed   By: Francene Boyers M.D.   On: 07/04/2015 13:19   I have personally reviewed and evaluated these images and lab results as part of my medical decision-making.   EKG Interpretation   Date/Time:  Monday July 04 2015 12:37:26 EDT Ventricular Rate:  83 PR Interval:    QRS Duration: 120 QT Interval:  393 QTC Calculation: 462 R Axis:   -64 Text Interpretation:  Sinus rhythm Left atrial enlargement Nonspecific  IVCD with LAD Left ventricular hypertrophy Inferior infarct, recent  Anterior infarct, old Lateral leads are also involved SINCE LAST TRACING  HEART RATE HAS INCREASED Confirmed by Ethelda Chick  MD, SAM [phone]) on  07/04/2015 2:26:40 PM      MDM  Patient with history of pulmonary fibrosis presents for evaluation of cough and fever. Given recent medical admission for cardiac cath at the end of May, patient meets criteria for hospital acquired pneumonia. Temperature on arrival 100.31F,  given 650 mg Tylenol. Her lactic acid is 2.68. Leukocytosis 16.1. Pending urinalysis. Chest x-ray without any obvious pneumonia, severe chronic interstitial and instructed lung disease. However, given patient's clinical presentation, she will be treated with fluids, empiric antibiotics. Discussed with my attending, Dr. Ethelda Chick. Patient will need medical admission for IV antibiotics for likely hospital-acquired pneumonia. Discussed with Marlowe Kays, PA-C. Patient admitted to hospital service Final diagnoses:  HCAP (healthcare-associated pneumonia)       Joycie Peek, PA-C 07/04/15 1805  Doug Sou, MD 07/05/15 1555

## 2015-07-04 NOTE — H&P (Signed)
History and Physical    [name]:[id number] DOB: February 23, 1927 DOA: 07/04/2015   PCP: Darrow Bussing, MD   Patient coming from:    Chief Complaint:  HPI: [name] is a 82 y.o. female with medical history significant for COPD asthma/history of frequent pneumonias/pulmonary fibrosis, on home O2 2 Liters, Hypothyroidism, CAD, non ETOH  cirrhosis, GERD, presenting with 2 week history of  Shortness of breath, and low grade fever.Reports blood tinged sputum attributed to forceful cough and trying to clear her throat frequently. Denies rhinorrhea. Denieschills, night sweats or mucositis. Reports fevers up to 100.9 at home. Denies any chest pain, chest wall pain or palpitations. Denies any abdominal pain. Appetite is normal.  Has intermittent nausea due to mucous buildup, no vomiting. Denies dizziness or vertigo. Denies lower extremity swelling. No confusion was reported. Denies any vision changes, double vision or headaches.Denies any sick contacts that she is aware of, but was recently hospitalized in 05/2013 for NSTEM/cath   ED Course:  BP 123/52 mmHg  Pulse 76  Temp(Src) 98.6 F (37 C) (Oral)  Resp 16  SpO2 97% Tmax at ED 100.4 WBC 16.1 (steroids) Hb 10.4, PLts 122 Cr 1.55 Lactic acid 2.58_.1.89 CXR severe chronic lung disease  Without infiltrates or effusions Received Vanc and Cefepime and IVF  Cultures pending    Review of Systems: As per HPI otherwise 10 point review of systems negative.   Past Medical History  Diagnosis Date  . COPD (chronic obstructive pulmonary disease) (HCC)   . Hyperlipemia     a. reviewed with Dr. Matthias Hughs 6/17 >> ok to take statin as cirrhosis is not advanced   . Shortness of breath     with exertion  . Asthma   . Pneumonia     hx of frequent episodes of pneumonia  . Headache(784.0)     MIgraine- Visualchanges- flashing lights and cant focus and cant see.  Marland Kitchen GERD (gastroesophageal reflux disease)   . Arthritis   . Diverticulitis 2008  .  Pulmonary fibrosis (HCC)   . Hypertension   . Cirrhosis of liver not due to alcohol (HCC)   . Hypothyroidism   . On home O2     2L N/C  . CAD (coronary artery disease) 06/24/2015    a   S/p NSTEMI 5/17 >> LHC - RI 60%, dRCA 95%, R post AV br 75% >> PCI: 2.75 x 24 mm Synergy DES to the distal RCA  . Ischemic cardiomyopathy 06/24/2015    a  S/p NSTEMI 5/17 //  b  Echo 06/04/15:  EF 45-50%, grade 1 diastolic dysfunction, severe inferior hypokinesis, moderate inferoseptal and apical inferior hypokinesis, mild inferolateral hypokinesis, at least mild aortic stenosis, moderate MR, moderate LAE  . Mitral regurgitation 06/24/2015    Mod eccentric MR by echo in 5/17  . History of non-ST elevation myocardial infarction (NSTEMI)     Past Surgical History  Procedure Laterality Date  . Eye surgery  05/2008    - bilateral  . Back surgery    . Total knee arthroplasty  11/2008  . Rotator cuff repair  2008    right  . Retinal tear repair cryotherapy    . Tonsillectomy    . Appendectomy    . Dilation and curettage of uterus    . Total knee arthroplasty  07/03/2011    Procedure: TOTAL KNEE ARTHROPLASTY;  Surgeon: Valeria Batman, MD;  Location: [facility] OR;  Service: Orthopedics;  Laterality: Right;  . Transthoracic echocardiogram  04/12/2011  EF>55%; mod concentric LVH; mild-mod TR  . Varicose vein surgery    . Cardiac catheterization N/A 06/05/2015    Procedure: Left Heart Cath and Coronary Angiography;  Surgeon: Runell GessJonathan J Berry, MD;  [facility];  Service: Cardiovascular;  Laterality: N/A;  . Cardiac catheterization  06/05/2015    Procedure: Coronary Stent Intervention;  Surgeon: Runell GessJonathan J Berry, MD;  [facility];  Service: Cardiovascular;;    Social History Social History   Social History  . Marital Status: Widowed    Spouse Name: N/A  . Number of Children: 5  . Years of Education: N/A   Occupational History  . Not on file.   Social History Main Topics  .  Smoking status: Former Smoker -- 1.5 years    Quit date: 01/09/1947  . Smokeless tobacco: Never Used     Comment: socially smoed x 1 1/2 years 04/29/13  . Alcohol Use: No  . Drug Use: No  . Sexual Activity: No   Other Topics Concern  . Not on file   Social History Narrative     No Known Allergies  Family History  Problem Relation Age of Onset  . Multiple sclerosis Sister   . Diabetes Sister   . Heart disease Sister   . Heart attack Brother   . Stroke Maternal Grandmother   . Hypertension Sister   . Hyperlipidemia Sister   . Stroke Sister       Prior to Admission medications   Medication Sig Start Date End Date Taking? Authorizing Provider  albuterol (PROVENTIL HFA;VENTOLIN HFA) 108 (90 BASE) MCG/ACT inhaler Inhale 2 puffs into the lungs every 4 (four) hours as needed for wheezing or shortness of breath. For shortness of breath   Yes Historical Provider, MD  albuterol (PROVENTIL) (5 MG/ML) 0.5% nebulizer solution Take 2.5 mg by nebulization every 4 (four) hours as needed for wheezing or shortness of breath.   Yes Historical Provider, MD  amLODipine (NORVASC) 2.5 MG tablet Take 2.5 mg by mouth every morning.    Yes Historical Provider, MD  aspirin 81 MG tablet Take 81 mg by mouth daily.   Yes Historical Provider, MD  atorvastatin (LIPITOR) 40 MG tablet Take 1 tablet (40 mg total) by mouth daily at 6 PM. 06/08/15  Yes Little IshikawaErin E Smith, NP  Calcium Citrate (CALCITRATE PO) Take 600 mg by mouth daily.   Yes Historical Provider, MD  chlorpheniramine (CHLOR-TRIMETON) 4 MG tablet Take 4 mg by mouth at bedtime.   Yes Historical Provider, MD  dextromethorphan (DELSYM) 30 MG/5ML liquid Take 30 mg by mouth 2 (two) times daily as needed for cough. Take 2 tsp twice a day as needed for cough   Yes Historical Provider, MD  diphenhydramine-acetaminophen (TYLENOL PM) 25-500 MG TABS Take 1-2 tablets by mouth at bedtime as needed (sleep).    Yes Historical Provider, MD  famotidine (PEPCID) 20 MG tablet  Take 20 mg by mouth 2 (two) times daily.   Yes Historical Provider, MD  hydrochlorothiazide (HYDRODIURIL) 25 MG tablet Take 25 mg by mouth every morning.   Yes Historical Provider, MD  ipratropium (ATROVENT) 0.06 % nasal spray Place 2 sprays into both nostrils 4 (four) times daily as needed for rhinitis.   Yes Historical Provider, MD  irbesartan (AVAPRO) 300 MG tablet Take 300 mg by mouth every morning.   Yes Historical Provider, MD  levothyroxine (SYNTHROID, LEVOTHROID) 88 MCG tablet Take 88 mcg by mouth daily before breakfast.   Yes Historical  Provider, MD  Magnesium 250 MG TABS Take 1 tablet by mouth every morning.    Yes Historical Provider, MD  methocarbamol (ROBAXIN) 500 MG tablet Take 500 mg by mouth 2 (two) times daily.   Yes Historical Provider, MD  nebivolol (BYSTOLIC) 10 MG tablet Take 1 tablet (10 mg total) by mouth daily. 06/24/15  Yes Scott Moishe Spice, PA-C  nitroGLYCERIN (NITROSTAT) 0.4 MG SL tablet Place 1 tablet (0.4 mg total) under the tongue every 5 (five) minutes x 3 doses as needed for chest pain. 06/08/15  Yes Little Ishikawa, NP  OXYGEN Inhale 2 L into the lungs daily as needed (for sleep and with exertion). 2 l/m with sleep and exertion   Yes Historical Provider, MD  pantoprazole (PROTONIX) 40 MG tablet Take 40 mg by mouth 2 (two) times daily before a meal.   Yes Historical Provider, MD  Polyethyl Glycol-Propyl Glycol (SYSTANE) 0.4-0.3 % SOLN Place 1 drop into both eyes daily as needed (for dry eyes).    Yes Historical Provider, MD  predniSONE (DELTASONE) 10 MG tablet TAKE 2 TABLETS BY MOUTH IN THE  MORNING UNTIL BETTER THEN 1 TABLET DAILY 05/27/15  Yes Historical Provider, MD  predniSONE (DELTASONE) 5 MG tablet Take 1 tablet (5 mg total) by mouth daily with breakfast. 06/08/15  Yes Little Ishikawa, NP  ticagrelor (BRILINTA) 90 MG TABS tablet Take 1 tablet (90 mg total) by mouth 2 (two) times daily. 06/08/15  Yes Alexa Lucrezia Starch, MD  traMADol (ULTRAM) 50 MG tablet TAKE 1 TABLET BY MOUTH EVERY  4 HOURS FOR PAIN OR COUGH 05/09/15  Yes Nyoka Cowden, MD  amoxicillin-clavulanate (AUGMENTIN) 875-125 MG tablet Reported on 07/04/2015 05/30/15   Historical Provider, MD    Physical Exam:    Filed Vitals:   07/04/15 1508 07/04/15 1515 07/04/15 1530 07/04/15 1545  BP:  111/51 106/47 123/52  Pulse:  73 70 76  Temp: 98.6 F (37 C)     TempSrc: Oral     Resp:  29 16   SpO2:  96% 96% 97%       Constitutional: NAD, calm, comfortable 98% in RA Filed Vitals:   07/04/15 1508 07/04/15 1515 07/04/15 1530 07/04/15 1545  BP:  111/51 106/47 123/52  Pulse:  73 70 76  Temp: 98.6 F (37 C)     TempSrc: Oral     Resp:  29 16   SpO2:  96% 96% 97%   Eyes: PERRL, lids and conjunctivae normal ENMT: Mucous membranes are moist. Posterior pharynx clear of any exudate or lesions. Normal dentition.  Neck: normal, supple, no masses, no thyromegaly Respiratory:  Diffuse rhonchi and wheezing, trace rales bases. Normal respiratory effort. No accessory muscle use.  Cardiovascular: Regular rate and rhythm, no murmurs / rubs / gallops. No extremity edema. 2+ pedal pulses. No carotid bruits.  Abdomen: no tenderness, no masses palpated. No hepatosplenomegaly. Bowel sounds positive.  Musculoskeletal: no clubbing / cyanosis. No joint deformity upper and lower extremities. Good ROM, no contractures. Normal muscle tone.  Skin: no rashes, lesions, ulcers.  Neurologic: CN 2-12 grossly intact. Sensation intact, DTR normal. Strength 5/5 in all 4.  Psychiatric: Normal judgment and insight. Alert and oriented x 3. Normal mood.     Labs on Admission: I have personally reviewed following labs and imaging studies  CBC:  Recent Labs Lab 07/04/15 1248  WBC 16.1*  NEUTROABS 14.1*  HGB 10.4*  HCT 32.6*  MCV 93.1  PLT 122*    Basic Metabolic  Panel:  Recent Labs Lab 07/04/15 1248  NA 137  K 4.0  CL 102  CO2 27  GLUCOSE 150*  BUN 43*  CREATININE 1.55*  CALCIUM 9.0    GFR: Estimated Creatinine  Clearance: 20.9 mL/min (by C-G formula based on Cr of 1.55).  Liver Function Tests:  Recent Labs Lab 07/04/15 1248  AST 38  ALT 49  ALKPHOS 83  BILITOT 1.0  PROT 5.6*  ALBUMIN 2.8*   No results for input(s): LIPASE, AMYLASE in the last 168 hours. No results for input(s): AMMONIA in the last 168 hours.  Coagulation Profile: No results for input(s): INR, PROTIME in the last 168 hours.  Cardiac Enzymes: No results for input(s): CKTOTAL, CKMB, CKMBINDEX, TROPONINI in the last 168 hours.  BNP (last 3 results) No results for input(s): PROBNP in the last 8760 hours.  HbA1C: No results for input(s): HGBA1C in the last 72 hours.  CBG: No results for input(s): GLUCAP in the last 168 hours.  Lipid Profile: No results for input(s): CHOL, HDL, LDLCALC, TRIG, CHOLHDL, LDLDIRECT in the last 72 hours.  Thyroid Function Tests: No results for input(s): TSH, T4TOTAL, FREET4, T3FREE, THYROIDAB in the last 72 hours.  Anemia Panel: No results for input(s): VITAMINB12, FOLATE, FERRITIN, TIBC, IRON, RETICCTPCT in the last 72 hours.  Urine analysis:    Component Value Date/Time   COLORURINE YELLOW 07/04/2015 1454   APPEARANCEUR CLOUDY* 07/04/2015 1454   LABSPEC 1.022 07/04/2015 1454   PHURINE 5.5 07/04/2015 1454   GLUCOSEU NEGATIVE 07/04/2015 1454   HGBUR SMALL* 07/04/2015 1454   BILIRUBINUR NEGATIVE 07/04/2015 1454   KETONESUR NEGATIVE 07/04/2015 1454   PROTEINUR 30* 07/04/2015 1454   UROBILINOGEN 1.0 09/28/2014 1455   NITRITE NEGATIVE 07/04/2015 1454   LEUKOCYTESUR MODERATE* 07/04/2015 1454    Sepsis Labs: @LABRCNTIP (procalcitonin:4,lacticidven:4) )No results found for this or any previous visit (from the past 240 hour(s)).   Radiological Exams on Admission: Dg Chest 2 View  07/04/2015  CLINICAL DATA:  Shortness of breath. EXAM: CHEST  2 VIEW COMPARISON:  Radiographs dated 06/03/2015, 10/12/2014 and 07/14/2014 and chest CT dated 09/29/2014 FINDINGS: The patient has severe  chronic interstitial and obstructive lung disease with multiple areas of parenchymal scarring bilaterally which are stable. No acute infiltrates or effusions. Diffuse osteopenia with straightening of the thoracic kyphosis. Aortic atherosclerosis. Chronic thoracolumbar scoliosis. No acute bone abnormality. IMPRESSION: No acute abnormality.  Severe chronic lung disease. Electronically Signed   By: Francene BoyersJames  Maxwell M.D.   On: 07/04/2015 13:19    EKG: Independently reviewed.   Tmax at ED 100.4 WBC 16.1 (steroids) Hb 10.4, PLts 122 Cr 1.55 Lactic acid 2.58_.1.89 CXR Received Vanc and Cefepime Cultures pending   Assessment/Plan Principal Problem:   HCAP (healthcare-associated pneumonia) Active Problems:   Hypothyroidism   Pulmonary fibrosis (HCC)   OA (osteoarthritis)   Asthma   Non-alcoholic cirrhosis (HCC)   Fever   Chronic respiratory failure with hypoxia (HCC)   GERD (gastroesophageal reflux disease)   Cirrhosis (HCC)   History of non-ST elevation myocardial infarction (NSTEMI)   HLD (hyperlipidemia)   Acute  On chronic respiratory failure without hypoxia likely related to HCAP (recent admission for NSTEMI in 05/2015) . Patient on home O2 2 liters, currently at 98 in RA.  Lactic acid 2.58 now  1.89, next value pending . CXR severe chronic lung disease without infiltrates or effusions. Received IV Cefepime and Vanc. Tmax 100.4 at the ED, now afebrile. Patient in NAD   -Admit to tele obs -Await blood  cultures and sputum cultures  -Continued oxygen as needed -Antibiotics per protocol with Cefepime and Vanc -Strep pneumo urine antigen -Nebulizers every 4 hrs prn Continue home steroids Resp care to see in view of chronic pulmonary issues   Leukocytosis, likely reactive, related to underlying infection and steroids . WBC 16  CXR without frank infiltrates  Tmax 100.4 , now afebrile,  antibiotics as above   IVF abd antibiotics as above   Repeat CBC in AM   CAD,  EKG without ACS, last  cardiac catheterization with stent in 05/2015, patient is cardiac pain free at this time. Continue ASA, Brillinta  high dose statin, beta blockers  Hyperlipidemia Continue home statins  Hypothyroidism: -Continue home Synthroid  GERD, with history of  GIB in the past , no acute symptoms: Continue PPI  Thrombocytopenia Likely due to non alcoholic cirrhosis, current count 122k No transfusion is indicated at this time Repeat CBC in am  Transfuse 1 unit of platelets if count is less or equal than 10,000 or 20,000 if the patient is acutely bleeding SCDs for now   Chronic kidney disease stage 3 B    baseline creatinine 1.1-1.2, currently 1.55  I VF Hold HCTZ if Cr still 1.5 or higher. If cultures negative, d/c Vanc   Repeat CMET in am Renal diet      DVT prophylaxis:  SCDs Code Status:   Full     Family Communication:  Discussed with patient daughter Disposition Plan: Expect patient to be discharged to home after condition improves Consults called:    None Admission status:Tele  Obs   Holdan Stucke E, PA-C [facility]   If 7PM-7AM, please contact night-coverage [email or web address] Password [email or web address]  07/04/2015, 4:55 PM

## 2015-07-04 NOTE — ED Notes (Signed)
Attempted report to the floor 

## 2015-07-04 NOTE — ED Provider Notes (Signed)
Patient complains of productive cough and shortness of breath onset approximately 2 weeks ago. She reports fever at home with maximum temperature 90.9 0.92 nights ago. Also admits to shortness of breath. Patient had cardiac stents placed one month ago. On exam she is in no respiratory distress. Lungs diffuse coarse rhonchi. In light of productive cough, dyspnea and fever. Will treat for healthcare associated pneumonia . chest x-ray viewed by me  Doug SouSam Valine Drozdowski, MD 07/04/15 1527

## 2015-07-05 ENCOUNTER — Observation Stay (HOSPITAL_COMMUNITY): Payer: Medicare Other

## 2015-07-05 DIAGNOSIS — Z7902 Long term (current) use of antithrombotics/antiplatelets: Secondary | ICD-10-CM | POA: Diagnosis not present

## 2015-07-05 DIAGNOSIS — I255 Ischemic cardiomyopathy: Secondary | ICD-10-CM | POA: Diagnosis present

## 2015-07-05 DIAGNOSIS — D638 Anemia in other chronic diseases classified elsewhere: Secondary | ICD-10-CM | POA: Diagnosis present

## 2015-07-05 DIAGNOSIS — K7469 Other cirrhosis of liver: Secondary | ICD-10-CM | POA: Diagnosis not present

## 2015-07-05 DIAGNOSIS — M1990 Unspecified osteoarthritis, unspecified site: Secondary | ICD-10-CM | POA: Diagnosis present

## 2015-07-05 DIAGNOSIS — Y95 Nosocomial condition: Secondary | ICD-10-CM | POA: Diagnosis present

## 2015-07-05 DIAGNOSIS — I2510 Atherosclerotic heart disease of native coronary artery without angina pectoris: Secondary | ICD-10-CM | POA: Diagnosis present

## 2015-07-05 DIAGNOSIS — K7460 Unspecified cirrhosis of liver: Secondary | ICD-10-CM | POA: Diagnosis present

## 2015-07-05 DIAGNOSIS — J9611 Chronic respiratory failure with hypoxia: Secondary | ICD-10-CM | POA: Diagnosis not present

## 2015-07-05 DIAGNOSIS — B961 Klebsiella pneumoniae [K. pneumoniae] as the cause of diseases classified elsewhere: Secondary | ICD-10-CM | POA: Diagnosis present

## 2015-07-05 DIAGNOSIS — B9620 Unspecified Escherichia coli [E. coli] as the cause of diseases classified elsewhere: Secondary | ICD-10-CM | POA: Diagnosis present

## 2015-07-05 DIAGNOSIS — J441 Chronic obstructive pulmonary disease with (acute) exacerbation: Secondary | ICD-10-CM | POA: Diagnosis present

## 2015-07-05 DIAGNOSIS — Z7952 Long term (current) use of systemic steroids: Secondary | ICD-10-CM | POA: Diagnosis not present

## 2015-07-05 DIAGNOSIS — A419 Sepsis, unspecified organism: Secondary | ICD-10-CM | POA: Diagnosis present

## 2015-07-05 DIAGNOSIS — J8410 Pulmonary fibrosis, unspecified: Secondary | ICD-10-CM | POA: Diagnosis present

## 2015-07-05 DIAGNOSIS — J440 Chronic obstructive pulmonary disease with acute lower respiratory infection: Secondary | ICD-10-CM | POA: Diagnosis present

## 2015-07-05 DIAGNOSIS — N183 Chronic kidney disease, stage 3 (moderate): Secondary | ICD-10-CM | POA: Diagnosis present

## 2015-07-05 DIAGNOSIS — N179 Acute kidney failure, unspecified: Secondary | ICD-10-CM | POA: Diagnosis present

## 2015-07-05 DIAGNOSIS — E785 Hyperlipidemia, unspecified: Secondary | ICD-10-CM | POA: Diagnosis present

## 2015-07-05 DIAGNOSIS — J189 Pneumonia, unspecified organism: Secondary | ICD-10-CM | POA: Diagnosis present

## 2015-07-05 DIAGNOSIS — I129 Hypertensive chronic kidney disease with stage 1 through stage 4 chronic kidney disease, or unspecified chronic kidney disease: Secondary | ICD-10-CM | POA: Diagnosis present

## 2015-07-05 DIAGNOSIS — J9622 Acute and chronic respiratory failure with hypercapnia: Secondary | ICD-10-CM | POA: Diagnosis not present

## 2015-07-05 DIAGNOSIS — E039 Hypothyroidism, unspecified: Secondary | ICD-10-CM | POA: Diagnosis present

## 2015-07-05 DIAGNOSIS — Z6821 Body mass index (BMI) 21.0-21.9, adult: Secondary | ICD-10-CM | POA: Diagnosis not present

## 2015-07-05 DIAGNOSIS — I252 Old myocardial infarction: Secondary | ICD-10-CM | POA: Diagnosis not present

## 2015-07-05 DIAGNOSIS — R6520 Severe sepsis without septic shock: Secondary | ICD-10-CM | POA: Diagnosis present

## 2015-07-05 DIAGNOSIS — J9621 Acute and chronic respiratory failure with hypoxia: Secondary | ICD-10-CM | POA: Diagnosis present

## 2015-07-05 DIAGNOSIS — Z9981 Dependence on supplemental oxygen: Secondary | ICD-10-CM | POA: Diagnosis not present

## 2015-07-05 DIAGNOSIS — E43 Unspecified severe protein-calorie malnutrition: Secondary | ICD-10-CM | POA: Diagnosis present

## 2015-07-05 DIAGNOSIS — Z7982 Long term (current) use of aspirin: Secondary | ICD-10-CM | POA: Diagnosis not present

## 2015-07-05 DIAGNOSIS — E876 Hypokalemia: Secondary | ICD-10-CM | POA: Diagnosis present

## 2015-07-05 DIAGNOSIS — D6959 Other secondary thrombocytopenia: Secondary | ICD-10-CM | POA: Diagnosis present

## 2015-07-05 DIAGNOSIS — K219 Gastro-esophageal reflux disease without esophagitis: Secondary | ICD-10-CM | POA: Diagnosis present

## 2015-07-05 DIAGNOSIS — A4151 Sepsis due to Escherichia coli [E. coli]: Secondary | ICD-10-CM | POA: Diagnosis present

## 2015-07-05 DIAGNOSIS — N3001 Acute cystitis with hematuria: Secondary | ICD-10-CM | POA: Diagnosis present

## 2015-07-05 DIAGNOSIS — Z87891 Personal history of nicotine dependence: Secondary | ICD-10-CM | POA: Diagnosis not present

## 2015-07-05 LAB — CBC
HCT: 28 % — ABNORMAL LOW (ref 36.0–46.0)
HEMOGLOBIN: 8.9 g/dL — AB (ref 12.0–15.0)
MCH: 29.7 pg (ref 26.0–34.0)
MCHC: 31.8 g/dL (ref 30.0–36.0)
MCV: 93.3 fL (ref 78.0–100.0)
Platelets: 93 10*3/uL — ABNORMAL LOW (ref 150–400)
RBC: 3 MIL/uL — ABNORMAL LOW (ref 3.87–5.11)
RDW: 14.8 % (ref 11.5–15.5)
WBC: 18.6 10*3/uL — ABNORMAL HIGH (ref 4.0–10.5)

## 2015-07-05 LAB — COMPREHENSIVE METABOLIC PANEL
ALK PHOS: 69 U/L (ref 38–126)
ALT: 31 U/L (ref 14–54)
ANION GAP: 9 (ref 5–15)
AST: 28 U/L (ref 15–41)
Albumin: 2.1 g/dL — ABNORMAL LOW (ref 3.5–5.0)
BILIRUBIN TOTAL: 1.1 mg/dL (ref 0.3–1.2)
BUN: 36 mg/dL — ABNORMAL HIGH (ref 6–20)
CALCIUM: 8 mg/dL — AB (ref 8.9–10.3)
CO2: 22 mmol/L (ref 22–32)
CREATININE: 1.52 mg/dL — AB (ref 0.44–1.00)
Chloride: 108 mmol/L (ref 101–111)
GFR, EST AFRICAN AMERICAN: 35 mL/min — AB (ref >60)
GFR, EST NON AFRICAN AMERICAN: 30 mL/min — AB (ref >60)
Glucose, Bld: 116 mg/dL — ABNORMAL HIGH (ref 65–99)
Potassium: 3.6 mmol/L (ref 3.5–5.1)
SODIUM: 139 mmol/L (ref 135–145)
TOTAL PROTEIN: 4.5 g/dL — AB (ref 6.5–8.1)

## 2015-07-05 LAB — BLOOD CULTURE ID PANEL (REFLEXED)
Acinetobacter baumannii: NOT DETECTED
CANDIDA ALBICANS: NOT DETECTED
CANDIDA GLABRATA: NOT DETECTED
CANDIDA PARAPSILOSIS: NOT DETECTED
CANDIDA TROPICALIS: NOT DETECTED
Candida krusei: NOT DETECTED
Carbapenem resistance: NOT DETECTED
ENTEROBACTER CLOACAE COMPLEX: NOT DETECTED
ENTEROCOCCUS SPECIES: NOT DETECTED
ESCHERICHIA COLI: DETECTED — AB
Enterobacteriaceae species: DETECTED — AB
Haemophilus influenzae: NOT DETECTED
KLEBSIELLA PNEUMONIAE: NOT DETECTED
Klebsiella oxytoca: NOT DETECTED
Listeria monocytogenes: NOT DETECTED
Methicillin resistance: NOT DETECTED
Neisseria meningitidis: NOT DETECTED
PROTEUS SPECIES: NOT DETECTED
Pseudomonas aeruginosa: NOT DETECTED
STREPTOCOCCUS PNEUMONIAE: NOT DETECTED
Serratia marcescens: NOT DETECTED
Staphylococcus aureus (BCID): NOT DETECTED
Staphylococcus species: NOT DETECTED
Streptococcus agalactiae: NOT DETECTED
Streptococcus pyogenes: NOT DETECTED
Streptococcus species: NOT DETECTED
VANCOMYCIN RESISTANCE: NOT DETECTED

## 2015-07-05 LAB — LEGIONELLA PNEUMOPHILA SEROGP 1 UR AG: L. pneumophila Serogp 1 Ur Ag: NEGATIVE

## 2015-07-05 MED ORDER — METHYLPREDNISOLONE SODIUM SUCC 40 MG IJ SOLR
40.0000 mg | Freq: Two times a day (BID) | INTRAMUSCULAR | Status: DC
  Administered 2015-07-05 – 2015-07-06 (×3): 40 mg via INTRAVENOUS
  Filled 2015-07-05 (×3): qty 1

## 2015-07-05 MED ORDER — ENSURE ENLIVE PO LIQD
237.0000 mL | Freq: Two times a day (BID) | ORAL | Status: DC
  Administered 2015-07-05 – 2015-07-06 (×2): 237 mL via ORAL

## 2015-07-05 MED ORDER — METHOCARBAMOL 500 MG PO TABS
250.0000 mg | ORAL_TABLET | Freq: Once | ORAL | Status: AC
  Administered 2015-07-05: 250 mg via ORAL
  Filled 2015-07-05: qty 1

## 2015-07-05 NOTE — Progress Notes (Signed)
PHARMACY - PHYSICIAN COMMUNICATION CRITICAL VALUE ALERT - BLOOD CULTURE IDENTIFICATION (BCID)  Results for orders placed or performed during the hospital encounter of 07/04/15  Blood Culture ID Panel (Reflexed) (Collected: 07/04/2015  2:53 PM)  Result Value Ref Range   Enterococcus species NOT DETECTED NOT DETECTED   Vancomycin resistance NOT DETECTED NOT DETECTED   Listeria monocytogenes NOT DETECTED NOT DETECTED   Staphylococcus species NOT DETECTED NOT DETECTED   Staphylococcus aureus NOT DETECTED NOT DETECTED   Methicillin resistance NOT DETECTED NOT DETECTED   Streptococcus species NOT DETECTED NOT DETECTED   Streptococcus agalactiae NOT DETECTED NOT DETECTED   Streptococcus pneumoniae NOT DETECTED NOT DETECTED   Streptococcus pyogenes NOT DETECTED NOT DETECTED   Acinetobacter baumannii NOT DETECTED NOT DETECTED   Enterobacteriaceae species DETECTED (A) NOT DETECTED   Enterobacter cloacae complex NOT DETECTED NOT DETECTED   Escherichia coli DETECTED (A) NOT DETECTED   Klebsiella oxytoca NOT DETECTED NOT DETECTED   Klebsiella pneumoniae NOT DETECTED NOT DETECTED   Proteus species NOT DETECTED NOT DETECTED   Serratia marcescens NOT DETECTED NOT DETECTED   Carbapenem resistance NOT DETECTED NOT DETECTED   Haemophilus influenzae NOT DETECTED NOT DETECTED   Neisseria meningitidis NOT DETECTED NOT DETECTED   Pseudomonas aeruginosa NOT DETECTED NOT DETECTED   Candida albicans NOT DETECTED NOT DETECTED   Candida glabrata NOT DETECTED NOT DETECTED   Candida krusei NOT DETECTED NOT DETECTED   Candida parapsilosis NOT DETECTED NOT DETECTED   Candida tropicalis NOT DETECTED NOT DETECTED    Name of physician (or Provider) Contacted: Dr Izola PriceMyers  Changes to prescribed antibiotics required: None at this point - Currently on LVQ - newly admitted patient so Dr Izola PriceMyers has not seen yet.  Isaac BlissMichael Jaydan Chretien, PharmD, BCPS, [facility] LLC[facility]CCCP Clinical Pharmacist Pager [phone] 07/05/2015 7:23 AM

## 2015-07-05 NOTE — Clinical Documentation Improvement (Addendum)
Hospitalist  Please document query responses in the progress notes and discharge summary, not on the CDI BPA form in [facility]. Thank you!  Query 1 of 2 The patient has a known history of COPD and pulmonary fibrosis per the progress note dated 07/05/15.  Patient was also on chronic PO steroids prior to admission than have been changed to IV Solumedrol this admission as of 07/05/15.  Please document the current status of the patient's COPD:  - Acute COPD Exacerbation  - COPD without exacerbation  - Other status  - Unable to clinically determine  Query 2 of 2 Please document if a condition below provides greater specificity regarding the patient's "Acute on Chronic kidney disease stage 3" documented in the progress note dated 07/05/15:  - Acute Kidney Injury on Stage 3 CKD  - other condition  - Unable to clinically determine  Clinical Information: "Acute on Chronic kidney disease stage 3  - with baseline Cr ~ 1.2  - now up secondary to the acute illness  - monitor closely  - BMP In AM" documented in the progress note 07/05/15.   Please exercise your independent, professional judgment when responding. A specific answer is not anticipated or expected.   Thank You, Jerral Ralphathy R Fitzroy Mikami  RN BSN CDDS [phone] Health Information Management [place]

## 2015-07-05 NOTE — Progress Notes (Addendum)
Patient ID: [name], female   DOB: [birth year], 82 y.o.   MRN: [id number]    PROGRESS NOTE    [name]  EZM:[id number] DOB: [birth year] DOA: 07/04/2015  PCP: Lujean Amel, MD   Brief Narrative:  82 y.o. female with medical history significant for COPD asthma/history of frequent pneumonias/pulmonary fibrosis, on home O2 2 Liters, Hypothyroidism, CAD, non ETOH cirrhosis, GERD, presenting with main concern of 1-2 weeks duration of progressively worsening dyspnea that initially started with exertion and has progressed to dyspnea at rest in the past 48 hours, PTA. This was associated with fevers, cough productive of yellow sputum with streaks of blood in it.   Please note that pt should meet criteria for inpatient stay, she was admitted for acute respiratory failure secondary to HCAP, E. Coli bacteremia and UTI. Needs IV ABX, IV solumedrol. She will require at least 3-4 days in the hospital before medications can be changed to PO.   Assessment & Plan:  Acuteon chronic respiratory failure with hypoxia likely related to HCAP, unknown pathogen - imposed on known COPD and pulmonary fibrosis steroid dependent on 10 mg PO QD - recent admission for NSTEMI in 05/2015 - Patient on home O2 2 liters, currently 97% on 2 L [place] - pt looks clinically stable this AM and reports feeling better however, her lung sounds are course and rhonchi diffusely - I have tried to taper her off oxygen but she quickly desaturated to 89 - 90% and will therefore require cont O2 via [place] for now - I will also stop her home Prednisone and place on Solumedrol 40 mg BID IV and will plan to taper down as clinically indicated  - continue Levaquin day #2 - follow up on sputum cultures - provide nebulizers scheduled and as needed - encouraged use of IS while awake   Sepsis due to HCAP, unknown pathogen, bacteremia E. Coli, UTI with g- rods  - please note that pt has met criteria for sepsis with T > 100.5 F, WBC 16 K,  lactic acid 2.68, RR > 22 bpm, elevated procalcitonin level > 5 - source could be also UTI, preliminary urine culture with g- rods, final report pending  - E. Coli bacteremia also noted  - Levaquin should be adequate  - continue levaquin for now day #2 - follow up on blood and urine cultures, sputum cultures - follow fever curve and check CBC in AM - lactic acid has fortunately cleared   Acute exacerbation of COPD - significant wheezing on exam noted this AM - will stop PO Prednisone and change pt to Solumedrol IV - continue BD's scheduled and as needed   CAD - EKG without ACS, last cardiac catheterization with stent in 05/2015 - Continue ASA, Brillinta high dose statin, beta blockers  Hyperlipidemia - Continue home statins  Hypothyroidism: - Continue home Synthroid  GERD - with history of GIB in the past , no acute symptoms: - on PPI   Thrombocytopenia, anemia of chronic disease  - Likely due to non alcoholic cirrhosis, current count 90 K - no indication for transfusion - CBC in AM  Acute kidney injury imposed on Chronic kidney disease stage 3  - secondary to sepsis etiology  - with baseline Cr ~ 1.2 - monitor closely  - BMP In AM  DVT prophylaxis: SCD Code Status: Full  Family Communication: Patient at bedside  Disposition Plan: Home when off IVF ABX   Consultants:   None  Procedures:   None  Antimicrobials:  Vanc and Zosyn x 1 6/26  6/26 Levaquin -->   Subjective: Reports feeling better.   Objective: Filed Vitals:   07/05/15 0541 07/05/15 0920 07/05/15 1415 07/05/15 1421  BP: 112/45  129/57   Pulse: 80  78   Temp: 98.4 F (36.9 C)     TempSrc: Oral     Resp: 16  18   SpO2: 95% 97% 99% 98%    Intake/Output Summary (Last 24 hours) at 07/05/15 1543 Last data filed at 07/05/15 1402  Gross per 24 hour  Intake 3051.25 ml  Output      0 ml  Net 3051.25 ml   There were no vitals filed for this visit.  Examination:  General exam:  Appears calm and comfortable  Respiratory system: Course breath sounds bilaterally with exp wheezing, diminished breath sounds at bases with rhonchi  Cardiovascular system: S1 & S2 heard, RRR. No JVD, murmurs, rubs, gallops or clicks. No pedal edema. Gastrointestinal system: Abdomen is nondistended, soft and nontender. No organomegaly or masses felt.  Central nervous system: Alert and oriented. No focal neurological deficits. Extremities: Symmetric 5 x 5 power. Skin: No rashes, lesions or ulcers Psychiatry: Judgement and insight appear normal. Mood & affect appropriate.    Data Reviewed: I have personally reviewed following labs and imaging studies  CBC:  Recent Labs Lab 07/04/15 1248 07/05/15 0511  WBC 16.1* 18.6*  NEUTROABS 14.1*  --   HGB 10.4* 8.9*  HCT 32.6* 28.0*  MCV 93.1 93.3  PLT 122* 93*   Basic Metabolic Panel:  Recent Labs Lab 07/04/15 1248 07/05/15 0511  NA 137 139  K 4.0 3.6  CL 102 108  CO2 27 22  GLUCOSE 150* 116*  BUN 43* 36*  CREATININE 1.55* 1.52*  CALCIUM 9.0 8.0*   Liver Function Tests:  Recent Labs Lab 07/04/15 1248 07/05/15 0511  AST 38 28  ALT 49 31  ALKPHOS 83 69  BILITOT 1.0 1.1  PROT 5.6* 4.5*  ALBUMIN 2.8* 2.1*   Urine analysis:    Component Value Date/Time   COLORURINE YELLOW 07/04/2015 1454   APPEARANCEUR CLOUDY* 07/04/2015 1454   LABSPEC 1.022 07/04/2015 1454   PHURINE 5.5 07/04/2015 1454   GLUCOSEU NEGATIVE 07/04/2015 1454   HGBUR SMALL* 07/04/2015 1454   BILIRUBINUR NEGATIVE 07/04/2015 1454   KETONESUR NEGATIVE 07/04/2015 1454   PROTEINUR 30* 07/04/2015 1454   UROBILINOGEN 1.0 09/28/2014 1455   NITRITE NEGATIVE 07/04/2015 1454   LEUKOCYTESUR MODERATE* 07/04/2015 1454   Recent Results (from the past 240 hour(s))  Culture, blood (routine x 2)     Status: None (Preliminary result)   Collection Time: 07/04/15  2:53 PM  Result Value Ref Range Status   Specimen Description BLOOD RIGHT ANTECUBITAL  Final   Special  Requests BOTTLES DRAWN AEROBIC AND ANAEROBIC 5CC  Final   Culture  Setup Time   Final    GRAM NEGATIVE RODS IN BOTH AEROBIC AND ANAEROBIC BOTTLES Organism ID to follow CRITICAL RESULT CALLED TO, READ BACK BY AND VERIFIED WITH: Ailene Rud AT 0712 07/05/15 BY M KELLY    Culture TOO YOUNG TO READ  Final   Report Status PENDING  Incomplete  Blood Culture ID Panel (Reflexed)     Status: Abnormal   Collection Time: 07/04/15  2:53 PM  Result Value Ref Range Status   Enterococcus species NOT DETECTED NOT DETECTED Final   Vancomycin resistance NOT DETECTED NOT DETECTED Final   Listeria monocytogenes NOT DETECTED NOT DETECTED Final   Staphylococcus species  NOT DETECTED NOT DETECTED Final   Staphylococcus aureus NOT DETECTED NOT DETECTED Final   Methicillin resistance NOT DETECTED NOT DETECTED Final   Streptococcus species NOT DETECTED NOT DETECTED Final   Streptococcus agalactiae NOT DETECTED NOT DETECTED Final   Streptococcus pneumoniae NOT DETECTED NOT DETECTED Final   Streptococcus pyogenes NOT DETECTED NOT DETECTED Final   Acinetobacter baumannii NOT DETECTED NOT DETECTED Final   Enterobacteriaceae species DETECTED (A) NOT DETECTED Final    Comment: CRITICAL RESULT CALLED TO, READ BACK BY AND VERIFIED WITH: MIKE MACCIA,PHARMD @0716  07/05/15 MKELLY    Enterobacter cloacae complex NOT DETECTED NOT DETECTED Final   Escherichia coli DETECTED (A) NOT DETECTED Final    Comment: CRITICAL RESULT CALLED TO, READ BACK BY AND VERIFIED WITH: MIKE MACCIA,PHARMD @0716  07/05/15 KELLYM    Klebsiella oxytoca NOT DETECTED NOT DETECTED Final   Klebsiella pneumoniae NOT DETECTED NOT DETECTED Final   Proteus species NOT DETECTED NOT DETECTED Final   Serratia marcescens NOT DETECTED NOT DETECTED Final   Carbapenem resistance NOT DETECTED NOT DETECTED Final   Haemophilus influenzae NOT DETECTED NOT DETECTED Final   Neisseria meningitidis NOT DETECTED NOT DETECTED Final   Pseudomonas aeruginosa NOT  DETECTED NOT DETECTED Final   Candida albicans NOT DETECTED NOT DETECTED Final   Candida glabrata NOT DETECTED NOT DETECTED Final   Candida krusei NOT DETECTED NOT DETECTED Final   Candida parapsilosis NOT DETECTED NOT DETECTED Final   Candida tropicalis NOT DETECTED NOT DETECTED Final  Urine culture     Status: Abnormal (Preliminary result)   Collection Time: 07/04/15  2:54 PM  Result Value Ref Range Status   Specimen Description URINE, CLEAN CATCH  Final   Special Requests NONE  Final   Culture >=100,000 COLONIES/mL GRAM NEGATIVE RODS (A)  Final   Report Status PENDING  Incomplete  Culture, blood (routine x 2)     Status: None (Preliminary result)   Collection Time: 07/04/15  2:56 PM  Result Value Ref Range Status   Specimen Description BLOOD LEFT HAND  Final   Special Requests BOTTLES DRAWN AEROBIC AND ANAEROBIC 5CC  Final   Culture  Setup Time   Final    GRAM NEGATIVE RODS IN BOTH AEROBIC AND ANAEROBIC BOTTLES CRITICAL RESULT CALLED TO, READ BACK BY AND VERIFIED WITH: Ailene Rud AT 1941 07/05/15 BY M KELLY    Culture TOO YOUNG TO READ  Final   Report Status PENDING  Incomplete      Radiology Studies: Dg Chest 2 View  07/05/2015  CLINICAL DATA:  Shortness of breath for several days EXAM: CHEST  2 VIEW COMPARISON:  July 04, 2015 and July 14, 2014 chest radiograph; chest CT September 29, 2014 FINDINGS: There is extensive underlying fibrosis. Lungs are somewhat hyperexpanded, likely due to underlying obstructive pulmonary disease. The overall extent of fibrosis and scarring appear stable. No frank edema or consolidation is evident. Heart is upper normal in size, stable. The pulmonary vascularity is within normal limits. No adenopathy is evident. Bones are osteoporotic. There is postoperative change in the right shoulder. Sclerosis is noted in the right humeral head. There is lower thoracic dextroscoliosis with upper lumbar levoscoliosis. There is atherosclerotic calcification in the  aorta. IMPRESSION: Underlying obstructive pulmonary disease with fibrosis, stable. No edema or consolidation is noted. Subtle infiltrate could easily be obscured given the degree of fibrosis which is present. Stable cardiac silhouette. Aortic atherosclerosis noted. Postoperative change in the right shoulder. Question a degree of avascular necrosis in the right humeral head.  Electronically Signed   By: Lowella Grip III M.D.   On: 07/05/2015 07:44   Dg Chest 2 View  07/04/2015  CLINICAL DATA:  Shortness of breath. EXAM: CHEST  2 VIEW COMPARISON:  Radiographs dated 06/03/2015, 10/12/2014 and 07/14/2014 and chest CT dated 09/29/2014 FINDINGS: The patient has severe chronic interstitial and obstructive lung disease with multiple areas of parenchymal scarring bilaterally which are stable. No acute infiltrates or effusions. Diffuse osteopenia with straightening of the thoracic kyphosis. Aortic atherosclerosis. Chronic thoracolumbar scoliosis. No acute bone abnormality. IMPRESSION: No acute abnormality.  Severe chronic lung disease. Electronically Signed   By: Lorriane Shire M.D.   On: 07/04/2015 13:19      Scheduled Meds: . amLODipine  2.5 mg Oral q morning - 10a  . aspirin EC  81 mg Oral Daily  . atorvastatin  40 mg Oral q1800  . famotidine  20 mg Oral BID  . feeding supplement (ENSURE ENLIVE)  237 mL Oral BID BM  . hydrochlorothiazide  25 mg Oral q morning - 10a  . ipratropium-albuterol  3 mL Nebulization TID  . irbesartan  300 mg Oral q morning - 10a  . [START ON 07/06/2015] levofloxacin (LEVAQUIN) IV  500 mg Intravenous Q48H  . levothyroxine  88 mcg Oral QAC breakfast  . methylPREDNISolone (SOLU-MEDROL) injection  40 mg Intravenous Q12H  . nebivolol  10 mg Oral Daily  . pantoprazole  40 mg Oral BID AC  . ticagrelor  90 mg Oral BID   Continuous Infusions: . sodium chloride 75 mL (07/04/15 1841)        Time spent: 20 minutes    Faye Ramsay, MD [facility] Pager  [phone]  If 7PM-7AM, please contact night-coverage [email or web address] Password [facility] 07/05/2015, 3:43 PM

## 2015-07-05 NOTE — Progress Notes (Signed)
Initial Nutrition Assessment  DOCUMENTATION CODES:   Severe malnutrition in context of chronic illness  INTERVENTION:   -Ensure Enlive po BID, each supplement provides 350 kcal and 20 grams of protein  NUTRITION DIAGNOSIS:   Malnutrition related to chronic illness as evidenced by energy intake < or equal to 75% for > or equal to 1 month, severe depletion of muscle mass, severe depletion of body fat.  GOAL:   Patient will meet greater than or equal to 90% of their needs  MONITOR:   PO intake, Supplement acceptance, Labs, Weight trends, Skin, I & O's  REASON FOR ASSESSMENT:   Malnutrition Screening Tool    ASSESSMENT:   [name] is a 82 y.o. female with medical history significant for COPD asthma/history of frequent pneumonias/pulmonary fibrosis, on home O2 2 Liters, Hypothyroidism, CAD, non ETOH cirrhosis, GERD, presenting with 2 week history of Shortness of breath, and low grade fever.  Pt admitted with HCAP.   Hx obtained from pt and pt son at bedside. Pt reports poor appetite over the past 1-2 months. She typically consumes 2 meals per day (breakfast: eggs, grits, and toast and dinner: spaghetti). She occasionally consumes Boost supplements, but uses them as a meal replacement. Pt reports she tends to eat very little in the hospital "because I'm not a big eater". Pt consumed 25% of cole slaw 25% of sweet potato, and 75% of BBQ meat for lunch.   Pt estimates she has lost 30# within the past 2 weeks. She shares with this RD that her UBW is around 135#. However, this is not consistent with wt hx. Noted wt has been stable over the past year.   Nutrition-Focused physical exam completed. Findings are moderate to severe fat depletion, moderate to severe muscle depletion, and no edema.   Discussed importance of good PO intake to support healing. Pt amenable to Ensure supplements while in hospital.   Labs reviewed.   Diet Order:  Diet heart healthy/carb modified Room  service appropriate?: Yes; Fluid consistency:: Thin  Skin:  Reviewed, no issues  Last BM:  07/04/15  Height:   Ht Readings from Last 1 Encounters:  06/24/15 5\' 3"  (1.6 m)    Weight:   Wt Readings from Last 1 Encounters:  07/05/15 119 lb 3.2 oz (54.069 kg)    Ideal Body Weight:  52.3 kg  BMI:  Body mass index is 21.12 kg/(m^2).  Estimated Nutritional Needs:   Kcal:  1400-1600  Protein:  50-65 grams  Fluid:  1.4-1.6 L  EDUCATION NEEDS:   Education needs addressed  Nadya Hopwood A. Mayford KnifeWilliams, RD, LDN, CDE Pager: [phone] After hours Pager: [phone]

## 2015-07-06 ENCOUNTER — Inpatient Hospital Stay (HOSPITAL_COMMUNITY): Payer: Medicare Other

## 2015-07-06 DIAGNOSIS — R7881 Bacteremia: Secondary | ICD-10-CM

## 2015-07-06 DIAGNOSIS — K7469 Other cirrhosis of liver: Secondary | ICD-10-CM

## 2015-07-06 LAB — PROCALCITONIN: PROCALCITONIN: 16.17 ng/mL

## 2015-07-06 LAB — BASIC METABOLIC PANEL
ANION GAP: 9 (ref 5–15)
BUN: 38 mg/dL — ABNORMAL HIGH (ref 6–20)
CALCIUM: 8 mg/dL — AB (ref 8.9–10.3)
CO2: 22 mmol/L (ref 22–32)
Chloride: 107 mmol/L (ref 101–111)
Creatinine, Ser: 1.45 mg/dL — ABNORMAL HIGH (ref 0.44–1.00)
GFR, EST AFRICAN AMERICAN: 37 mL/min — AB (ref >60)
GFR, EST NON AFRICAN AMERICAN: 32 mL/min — AB (ref >60)
Glucose, Bld: 236 mg/dL — ABNORMAL HIGH (ref 65–99)
POTASSIUM: 3.3 mmol/L — AB (ref 3.5–5.1)
Sodium: 138 mmol/L (ref 135–145)

## 2015-07-06 LAB — CBC
HEMATOCRIT: 27.4 % — AB (ref 36.0–46.0)
Hemoglobin: 8.7 g/dL — ABNORMAL LOW (ref 12.0–15.0)
MCH: 28.8 pg (ref 26.0–34.0)
MCHC: 31.8 g/dL (ref 30.0–36.0)
MCV: 90.7 fL (ref 78.0–100.0)
PLATELETS: 97 10*3/uL — AB (ref 150–400)
RBC: 3.02 MIL/uL — AB (ref 3.87–5.11)
RDW: 14.7 % (ref 11.5–15.5)
WBC: 11.1 10*3/uL — AB (ref 4.0–10.5)

## 2015-07-06 LAB — URINE CULTURE: Culture: >=100000 — AB

## 2015-07-06 MED ORDER — FUROSEMIDE 10 MG/ML IJ SOLN
80.0000 mg | Freq: Once | INTRAMUSCULAR | Status: AC
  Administered 2015-07-06: 80 mg via INTRAVENOUS
  Filled 2015-07-06: qty 8

## 2015-07-06 MED ORDER — METOPROLOL TARTRATE 5 MG/5ML IV SOLN
5.0000 mg | Freq: Once | INTRAVENOUS | Status: AC
  Administered 2015-07-06: 5 mg via INTRAVENOUS
  Filled 2015-07-06: qty 5

## 2015-07-06 MED ORDER — ONDANSETRON HCL 4 MG/2ML IJ SOLN
4.0000 mg | Freq: Three times a day (TID) | INTRAMUSCULAR | Status: DC | PRN
  Administered 2015-07-06: 4 mg via INTRAVENOUS
  Filled 2015-07-06: qty 2

## 2015-07-06 MED ORDER — POTASSIUM CHLORIDE CRYS ER 20 MEQ PO TBCR
40.0000 meq | EXTENDED_RELEASE_TABLET | Freq: Once | ORAL | Status: AC
  Administered 2015-07-06: 40 meq via ORAL
  Filled 2015-07-06: qty 2

## 2015-07-06 MED ORDER — FAMOTIDINE 20 MG PO TABS
20.0000 mg | ORAL_TABLET | Freq: Every day | ORAL | Status: DC
  Administered 2015-07-07 – 2015-07-09 (×3): 20 mg via ORAL
  Filled 2015-07-06 (×3): qty 1

## 2015-07-06 MED ORDER — CEFAZOLIN SODIUM-DEXTROSE 2-4 GM/100ML-% IV SOLN
2.0000 g | Freq: Two times a day (BID) | INTRAVENOUS | Status: DC
  Administered 2015-07-06 – 2015-07-08 (×5): 2 g via INTRAVENOUS
  Filled 2015-07-06 (×9): qty 100

## 2015-07-06 MED ORDER — FUROSEMIDE 10 MG/ML IJ SOLN
40.0000 mg | Freq: Once | INTRAMUSCULAR | Status: AC
  Administered 2015-07-06: 40 mg via INTRAVENOUS
  Filled 2015-07-06: qty 4

## 2015-07-06 MED ORDER — PREDNISONE 50 MG PO TABS
50.0000 mg | ORAL_TABLET | Freq: Every day | ORAL | Status: DC
  Administered 2015-07-07 – 2015-07-09 (×3): 50 mg via ORAL
  Filled 2015-07-06 (×3): qty 1

## 2015-07-06 NOTE — Progress Notes (Addendum)
Shift event: RN paged earlier stating pt was SOB and requiring increased O2 to maintain O2 sats over 90%. O2 up to 5L. Increased work of breathing but not in severe respiratory distress. Ordered stat CXR which showed pulmonary edema. Ordered Lasix and metoprolol for high BP and HR and NP to bedside. S: breathing is "easing off some" but still feels SOB. No chest pain. Some nausea with event. No vomiting.  O: Fairly well appearing elderly WF in very mild respiratory distress. Alert and oriented. Family at bedside. Able to talk in short sentences. Skin pale but no cyanosis. RR mid to upper 20s. Mild to moderate increased WOB with mild use of accessory muscles. + crackles noted.   BP 170-180, HR 100-110. RR 24-30. O2 sat 91-93% on 5L.  A/P: 1. SOB with mild respiratory distress. Suspect flash pulmonary edema given new CXR findings accompanied by SOB and elevated HR and BP. Lasix, total of 120mg  given. Metoprolol total of 10mg  given. Pt is already diuresing and BP coming down. NP stayed on unit for awhile and went back to see pt.  Feeling better. + voiding x 4 times so far. Much less SOB. Respiratory effort decreased. O2 sat up to 99%. Wean O2 back to chronic 2L per [name].  2. Nausea-is likely just secondary to this event, but given hx of recent NSTEMI, will cycle troponins. Zofran.  3. HTN-associated worsening with #1. Continue to monitor.  4. Aggressive diuresis-Foley for the night.  RN to call for any further issues. Plan and findings discussed with pt, daughter and granddaughter at bedside. Diana NormanKaren Kirby-Graham, NP [facility] Update: Pt has had no further SOB or increased WOB tonight. 3L urine out. BP down to 110s. HR 60-70. O2 being weaned. Troponins 0.14 and 0.10 likely due to stress/pulmonary edema-trending downward.  KJKG, NP [facility]

## 2015-07-06 NOTE — Care Management Important Message (Signed)
Important Message  Patient Details  Name: Diana GraffDorothy S Bauer MRN: [id number] Date of Birth: 01/20/1927   Medicare Important Message Given:  Yes    [name], Stephan MinisterSusan Coleman 07/06/2015, 10:16 AM

## 2015-07-06 NOTE — Progress Notes (Signed)
Patient ID: [name], female   DOB: 1927/10/26, 82 y.o.   MRN: [id number]    PROGRESS NOTE    KIERAN NACHTIGAL  EHM:[id number] DOB: 1927/09/08 DOA: 07/04/2015  PCP: Lujean Amel, MD   Brief Narrative:  82 y.o. female with medical history significant for COPD asthma/history of frequent pneumonias/pulmonary fibrosis, on home O2 2 Liters, Hypothyroidism, CAD, non ETOH cirrhosis, GERD, presenting with main concern of 1-2 weeks duration of progressively worsening dyspnea that initially started with exertion and has progressed to dyspnea at rest in the past 48 hours, PTA. This was associated with fevers, cough productive of yellow sputum with streaks of blood in it.   Please note that pt should meet criteria for inpatient stay, she was admitted for acute respiratory failure secondary to HCAP, E. Coli bacteremia and UTI. Needs IV ABX, IV solumedrol. She will require at least 3-4 days in the hospital before medications can be changed to PO.   Assessment & Plan:  Acuteon chronic respiratory failure with hypoxia likely related to HCAP, unknown pathogen - imposed on known COPD and pulmonary fibrosis steroid dependent on 10 mg PO QD - recent admission for NSTEMI in 05/2015 - Patient on home O2 2 liters, currently 97% on 2 L [place] - pt looks clinically stable this AM and reports feeling better, her breath sounds are much clearer and with only minimal wheezing  - she was successfully tapered off oxygen via [place] and currently saturating well on RA > 92%  - She was started on Solumedrol 40 mg BID IV 6/27 and we can plan to taper to Prednisone today - would start with 50 mg tab daily and taper down by 10 mg daily until completed  - d/w pharmacy choice of abx, will change from Levaquin to Ancef  - follow up on sputum cultures - provide nebulizers scheduled and as needed - encouraged use of IS while awake   Sepsis due to HCAP, bacteremia E. Coli, UTI with E. Coli and Klebsiella  - please note that pt has  met criteria for sepsis with T > 100.5 F, WBC 16 K, lactic acid 2.68, RR > 22 bpm, elevated procalcitonin level > 5 - E. Coli bacteremia, E. Coli and Klebsiella UTI - changed Levaquin to Cefazolin  - follow up on final blood and urine cultures, sputum cultures - follow fever curve and check CBC in AM - lactic acid has fortunately cleared   Acute exacerbation of COPD - significant improvement in respiratory status  - place back on Prednisone and taper down as indicated above  - continue BD's scheduled and as needed   CAD - EKG without ACS, last cardiac catheterization with stent in 05/2015 - Continue ASA, Brillinta high dose statin, beta blockers - no chest pain this AM   Hypokalemia - mild, supplement and repeat BMP In AM  Hyperlipidemia - Continue home statins  Hypothyroidism - Continue home Synthroid  GERD - with history of GIB in the past , no acute symptoms: - on PPI   Thrombocytopenia, anemia of chronic disease  - Likely due to non alcoholic cirrhosis, current count 90 K - no indication for transfusion - CBC in AM  Acute kidney injury imposed on Chronic kidney disease stage 3  - secondary to sepsis etiology  - with baseline Cr ~ 1.2 - slightly better this AM  - monitor closely  - BMP In AM  DVT prophylaxis: SCD Code Status: Full  Family Communication: Patient at bedside  Disposition Plan: Home in  1-2 days if WBC is stable and no fevers  Consultants:   None  Procedures:   None  Antimicrobials:   Vanc and Zosyn x 1 6/26  6/26 Levaquin --> 6/27  6/28 Ancef (Cefazolin) -->    Subjective: Reports feeling better.   Objective: Filed Vitals:   07/05/15 2351 07/06/15 0546 07/06/15 1100 07/06/15 1548  BP: 133/61 94/76 127/57 149/67  Pulse: 87 89 93 108  Temp: 98 F (36.7 C) 98 F (36.7 C)  97.5 F (36.4 C)  TempSrc: Oral Oral    Resp: 20 18  18   Weight:      SpO2: 99% 98%  94%    Intake/Output Summary (Last 24 hours) at 07/06/15 1621 Last  data filed at 07/06/15 0913  Gross per 24 hour  Intake   1320 ml  Output      0 ml  Net   1320 ml   Filed Weights   07/05/15 1611  Weight: 54.069 kg (119 lb 3.2 oz)    Examination:  General exam: Appears calm and comfortable  Respiratory system: Better air movement bilaterally with scattered rhonchi and mild exp wheezing  Cardiovascular system: S1 & S2 heard, RRR. No JVD, rubs, gallops or clicks. No pedal edema. Gastrointestinal system: Abdomen is nondistended, soft and nontender. No organomegaly or masses felt.  Central nervous system: Alert and oriented. No focal neurological deficits. Extremities: Symmetric 5 x 5 power. Skin: No rashes, lesions or ulcers Psychiatry: Judgement and insight appear normal. Mood & affect appropriate.    Data Reviewed: I have personally reviewed following labs and imaging studies  CBC:  Recent Labs Lab 07/04/15 1248 07/05/15 0511 07/06/15 0438  WBC 16.1* 18.6* 11.1*  NEUTROABS 14.1*  --   --   HGB 10.4* 8.9* 8.7*  HCT 32.6* 28.0* 27.4*  MCV 93.1 93.3 90.7  PLT 122* 93* 97*   Basic Metabolic Panel:  Recent Labs Lab 07/04/15 1248 07/05/15 0511 07/06/15 0438  NA 137 139 138  K 4.0 3.6 3.3*  CL 102 108 107  CO2 27 22 22   GLUCOSE 150* 116* 236*  BUN 43* 36* 38*  CREATININE 1.55* 1.52* 1.45*  CALCIUM 9.0 8.0* 8.0*   Liver Function Tests:  Recent Labs Lab 07/04/15 1248 07/05/15 0511  AST 38 28  ALT 49 31  ALKPHOS 83 69  BILITOT 1.0 1.1  PROT 5.6* 4.5*  ALBUMIN 2.8* 2.1*   Urine analysis:    Component Value Date/Time   COLORURINE YELLOW 07/04/2015 1454   APPEARANCEUR CLOUDY* 07/04/2015 1454   LABSPEC 1.022 07/04/2015 1454   PHURINE 5.5 07/04/2015 1454   GLUCOSEU NEGATIVE 07/04/2015 1454   HGBUR SMALL* 07/04/2015 1454   [place] 07/04/2015 1454   KETONESUR NEGATIVE 07/04/2015 1454   PROTEINUR 30* 07/04/2015 1454   UROBILINOGEN 1.0 09/28/2014 1455   NITRITE NEGATIVE 07/04/2015 1454   LEUKOCYTESUR  MODERATE* 07/04/2015 1454   Recent Results (from the past 240 hour(s))  Culture, blood (routine x 2)     Status: Abnormal (Preliminary result)   Collection Time: 07/04/15  2:53 PM  Result Value Ref Range Status   Specimen Description BLOOD RIGHT ANTECUBITAL  Final   Special Requests BOTTLES DRAWN AEROBIC AND ANAEROBIC 5CC  Final   Culture  Setup Time   Final    GRAM NEGATIVE RODS IN BOTH AEROBIC AND ANAEROBIC BOTTLES Organism ID to follow CRITICAL RESULT CALLED TO, READ BACK BY AND VERIFIED WITH: Ailene Rud AT 7654 07/05/15 BY Craig Guess    Culture  ESCHERICHIA COLI (A)  Final   Report Status PENDING  Incomplete  Blood Culture ID Panel (Reflexed)     Status: Abnormal   Collection Time: 07/04/15  2:53 PM  Result Value Ref Range Status   Enterococcus species NOT DETECTED NOT DETECTED Final   Vancomycin resistance NOT DETECTED NOT DETECTED Final   Listeria monocytogenes NOT DETECTED NOT DETECTED Final   Staphylococcus species NOT DETECTED NOT DETECTED Final   Staphylococcus aureus NOT DETECTED NOT DETECTED Final   Methicillin resistance NOT DETECTED NOT DETECTED Final   Streptococcus species NOT DETECTED NOT DETECTED Final   Streptococcus agalactiae NOT DETECTED NOT DETECTED Final   Streptococcus pneumoniae NOT DETECTED NOT DETECTED Final   Streptococcus pyogenes NOT DETECTED NOT DETECTED Final   Acinetobacter baumannii NOT DETECTED NOT DETECTED Final   Enterobacteriaceae species DETECTED (A) NOT DETECTED Final    Comment: CRITICAL RESULT CALLED TO, READ BACK BY AND VERIFIED WITH: MIKE MACCIA,PHARMD @0716  07/05/15 MKELLY    Enterobacter cloacae complex NOT DETECTED NOT DETECTED Final   Escherichia coli DETECTED (A) NOT DETECTED Final    Comment: CRITICAL RESULT CALLED TO, READ BACK BY AND VERIFIED WITH: MIKE MACCIA,PHARMD @0716  07/05/15 KELLYM    Klebsiella oxytoca NOT DETECTED NOT DETECTED Final   Klebsiella pneumoniae NOT DETECTED NOT DETECTED Final   Proteus species NOT  DETECTED NOT DETECTED Final   Serratia marcescens NOT DETECTED NOT DETECTED Final   Carbapenem resistance NOT DETECTED NOT DETECTED Final   Haemophilus influenzae NOT DETECTED NOT DETECTED Final   Neisseria meningitidis NOT DETECTED NOT DETECTED Final   Pseudomonas aeruginosa NOT DETECTED NOT DETECTED Final   Candida albicans NOT DETECTED NOT DETECTED Final   Candida glabrata NOT DETECTED NOT DETECTED Final   Candida krusei NOT DETECTED NOT DETECTED Final   Candida parapsilosis NOT DETECTED NOT DETECTED Final   Candida tropicalis NOT DETECTED NOT DETECTED Final  Urine culture     Status: Abnormal   Collection Time: 07/04/15  2:54 PM  Result Value Ref Range Status   Specimen Description URINE, CLEAN CATCH  Final   Special Requests NONE  Final   Culture (A)  Final    >=100,000 COLONIES/mL KLEBSIELLA PNEUMONIAE >=100,000 COLONIES/mL ESCHERICHIA COLI    Report Status 07/06/2015 FINAL  Final   Organism ID, Bacteria KLEBSIELLA PNEUMONIAE (A)  Final   Organism ID, Bacteria ESCHERICHIA COLI (A)  Final      Susceptibility   Escherichia coli - MIC*    AMPICILLIN <=2 SENSITIVE Sensitive     CEFAZOLIN <=4 SENSITIVE Sensitive     CEFTRIAXONE <=1 SENSITIVE Sensitive     CIPROFLOXACIN <=0.25 SENSITIVE Sensitive     GENTAMICIN <=1 SENSITIVE Sensitive     IMIPENEM <=0.25 SENSITIVE Sensitive     NITROFURANTOIN <=16 SENSITIVE Sensitive     TRIMETH/SULFA <=20 SENSITIVE Sensitive     AMPICILLIN/SULBACTAM <=2 SENSITIVE Sensitive     PIP/TAZO <=4 SENSITIVE Sensitive     * >=100,000 COLONIES/mL ESCHERICHIA COLI   Klebsiella pneumoniae - MIC*    AMPICILLIN 16 RESISTANT Resistant     CEFAZOLIN <=4 SENSITIVE Sensitive     CEFTRIAXONE <=1 SENSITIVE Sensitive     CIPROFLOXACIN <=0.25 SENSITIVE Sensitive     GENTAMICIN <=1 SENSITIVE Sensitive     IMIPENEM <=0.25 SENSITIVE Sensitive     NITROFURANTOIN <=16 SENSITIVE Sensitive     TRIMETH/SULFA <=20 SENSITIVE Sensitive     AMPICILLIN/SULBACTAM 4  SENSITIVE Sensitive     PIP/TAZO 8 SENSITIVE Sensitive     * >=100,000  COLONIES/mL KLEBSIELLA PNEUMONIAE  Culture, blood (routine x 2)     Status: None (Preliminary result)   Collection Time: 07/04/15  2:56 PM  Result Value Ref Range Status   Specimen Description BLOOD LEFT HAND  Final   Special Requests BOTTLES DRAWN AEROBIC AND ANAEROBIC 5CC  Final   Culture  Setup Time   Final    GRAM NEGATIVE RODS IN BOTH AEROBIC AND ANAEROBIC BOTTLES CRITICAL RESULT CALLED TO, READ BACK BY AND VERIFIED WITH: Ailene Rud AT 7893 07/05/15 BY Jerilynn Mages KELLY    Culture GRAM NEGATIVE RODS  Final   Report Status PENDING  Incomplete      Radiology Studies: Dg Chest 2 View  07/05/2015  CLINICAL DATA:  Shortness of breath for several days EXAM: CHEST  2 VIEW COMPARISON:  July 04, 2015 and July 14, 2014 chest radiograph; chest CT September 29, 2014 FINDINGS: There is extensive underlying fibrosis. Lungs are somewhat hyperexpanded, likely due to underlying obstructive pulmonary disease. The overall extent of fibrosis and scarring appear stable. No frank edema or consolidation is evident. Heart is upper normal in size, stable. The pulmonary vascularity is within normal limits. No adenopathy is evident. Bones are osteoporotic. There is postoperative change in the right shoulder. Sclerosis is noted in the right humeral head. There is lower thoracic dextroscoliosis with upper lumbar levoscoliosis. There is atherosclerotic calcification in the aorta. IMPRESSION: Underlying obstructive pulmonary disease with fibrosis, stable. No edema or consolidation is noted. Subtle infiltrate could easily be obscured given the degree of fibrosis which is present. Stable cardiac silhouette. Aortic atherosclerosis noted. Postoperative change in the right shoulder. Question a degree of avascular necrosis in the right humeral head. Electronically Signed   By: Lowella Grip III M.D.   On: 07/05/2015 07:44      Scheduled Meds: . amLODipine   2.5 mg Oral q morning - 10a  . aspirin EC  81 mg Oral Daily  . atorvastatin  40 mg Oral q1800  .  ceFAZolin (ANCEF) IV  2 g Intravenous Q12H  . [START ON 07/07/2015] famotidine  20 mg Oral Daily  . feeding supplement (ENSURE ENLIVE)  237 mL Oral BID BM  . hydrochlorothiazide  25 mg Oral q morning - 10a  . ipratropium-albuterol  3 mL Nebulization TID  . irbesartan  300 mg Oral q morning - 10a  . levothyroxine  88 mcg Oral QAC breakfast  . methylPREDNISolone (SOLU-MEDROL) injection  40 mg Intravenous Q12H  . nebivolol  10 mg Oral Daily  . pantoprazole  40 mg Oral BID AC  . ticagrelor  90 mg Oral BID   Continuous Infusions: . sodium chloride 75 mL/hr at 07/06/15 1109     LOS: 1 day    Time spent: 20 minutes    Faye Ramsay, MD [facility] Pager [phone]  If 7PM-7AM, please contact night-coverage [email or web address] Password [facility] 07/06/2015, 4:21 PM

## 2015-07-06 NOTE — Progress Notes (Signed)
Craige CottaKirby called back to unit spoke with oncoming RN, Boneta LucksJenny.  Also spoke with this RN, states she ordered CXR (also informed her Dr Julian ReilGardner had ordered a CXR also, informed her of the amount of blood tinged sputum pt has been coughing which all just started this evening at shift change, at 1920. Pt has had a moderate to large amount of bloody sputum, and rales bilat. RT gave neb which has helped some, sats 93% on 3L currently. Family at bedside.

## 2015-07-06 NOTE — Progress Notes (Signed)
No response from Craige CottaKirby, so text page also sent to Dr Julian ReilGardner, RT giving pt neb at this time. Pt still coughing up pink blood tinged sputum. Pt has rales bilat--very wet sounding.

## 2015-07-06 NOTE — Progress Notes (Signed)
Spoke with Dr Julian ReilGardner informed of how pt was doing, as noted in previous notes. Neb treatment has helped some, sats 94% on 8L with neb infusing.  Still coughing of blood tinged sputum, pt is on Brilinta.  Dr Julian ReilGardner ordering a chest xray. Also wants Craige CottaKirby to know what's going on with pt. Await her return call.

## 2015-07-06 NOTE — Progress Notes (Signed)
Inpatient Diabetes Program Recommendations  AACE/ADA: New Consensus Statement on Inpatient Glycemic Control (2015)  Target Ranges:  Prepandial:   less than 140 mg/dL      Peak postprandial:   less than 180 mg/dL (1-2 hours)      Critically ill patients:  140 - 180 mg/dL    Review of Glycemic ControlResults for [name], Michaele S (MRN [id number]) as of 07/06/2015 09:52  Ref. Range 07/06/2015 04:38  Glucose Latest Ref Range: 65-99 mg/dL 366236 (H)    Inpatient Diabetes Program Recommendations:    While in the hospital, and on IV steroids, consider checking blood sugars tid with meals and HS with Novolog sensitive correction.  Thanks, Beryl MeagerJenny Dierdre Mccalip, RN, BC-ADM Inpatient Diabetes Coordinator Pager [phone] (8a-5p)

## 2015-07-06 NOTE — Progress Notes (Signed)
Notified Craige CottaKirby, NP that pt's BP is 181/74 HR  107. Pt's O2 sat was 85% on 3L. Pt sating 91-93% on 5L. Pt states she feels better now. Still working to breathe. NP states O2 sat over 90% is ok on 5L. NP order lopressor and lasix. Will continue to monitor pt. Nelda MarseilleJenny Thacker, RN

## 2015-07-06 NOTE — Progress Notes (Addendum)
Pt currently having productive cough with blood tinged sputum, has rales bilaterally. sats on 2L 86%, O2 increased to 3L. RT called to come give neb treatment. Text message sent to MD on call for [facility].  Above Message was sent to DelphosKirby, [facility] on call.

## 2015-07-06 NOTE — Progress Notes (Signed)
Notified Craige CottaKirby, NP that pt is nauseated. Craige CottaKirby, NP ordered Zofran. Will continue to monitor pt. Nelda MarseilleJenny Thacker, RN

## 2015-07-06 NOTE — Evaluation (Signed)
Physical Therapy Evaluation Patient Details Name: [name] MRN: [id number] DOB: 18-Feb-1927 Today's Date: 07/06/2015   History of Present Illness  82 y.o. female with medical history significant for COPD asthma/history of frequent pneumonias/pulmonary fibrosis, on home O2 2 Liters, Hypothyroidism, CAD, non ETOH cirrhosis, GERD, presenting with main concern of 1-2 weeks duration of progressively worsening dyspnea that initially started with exertion and has progressed to dyspnea at rest in the past 48 hours; she was admitted for acute respiratory failure secondary to HCAP, E. Coli bacteremia and UTI  Clinical Impression   Pt admitted with above diagnosis. Pt currently with functional limitations due to the deficits listed below (see PT Problem List).  Pt will benefit from skilled PT to increase their independence and safety with mobility to allow discharge to the venue listed below.       Follow Up Recommendations Home health PT (May progress well enough to not need OT)    Equipment Recommendations  None recommended by PT    Recommendations for Other Services OT consult (consider OT consult for energy conservation)     Precautions / Restrictions        Mobility  Bed Mobility Overal bed mobility: Needs Assistance Bed Mobility: Supine to Sit;Sit to Supine     Supine to sit: Supervision Sit to supine: Supervision   General bed mobility comments: Suervision mostly fo rlines  Transfers Overall transfer level: Needs assistance Equipment used: None Transfers: Sit to/from Stand Sit to Stand: Min guard         General transfer comment: Pt holding to bed rails for steadiness; cues to self-monitor for activity tolerance  Ambulation/Gait Ambulation/Gait assistance: Min guard Ambulation Distance (Feet):  [facility] Assistive device: 1 person hand held assist (UE support holding to bedrail)       General Gait Details: Cues to self-monitor for activity  tolernace  Stairs            Wheelchair Mobility    Modified Rankin (Stroke Patients Only)       Balance                                             Pertinent Vitals/Pain Pain Assessment: No/denies pain    Home Living Family/patient expects to be discharged to:: Private residence Living Arrangements: Alone Available Help at Discharge: Family;Available PRN/intermittently Type of Home: House Home Access: Stairs to enter Entrance Stairs-Rails: Right Entrance Stairs-Number of Steps: 2 Home Layout: Two level;Able to live on main level with bedroom/bathroom Home Equipment: Dan HumphreysWalker - 2 wheels;Walker - 4 wheels;Cane - quad      Prior Function Level of Independence: Independent with assistive device(s)         Comments: Tells me she typically uses her quad cane     Hand Dominance        Extremity/Trunk Assessment   Upper Extremity Assessment: Overall WFL for tasks assessed           Lower Extremity Assessment: Generalized weakness         Communication   Communication: No difficulties;Other (comment) (though some DOE)  Cognition Arousal/Alertness: Awake/alert Behavior During Therapy: WFL for tasks assessed/performed Overall Cognitive Status: Within Functional Limits for tasks assessed                      General Comments General comments (skin integrity,  edema, etc.): Initiated session on room air, and O2 sats were in the high 80s, low 90s; restarted O2 at 2 L and sats incr to 98%    Exercises        Assessment/Plan    PT Assessment Patient needs continued PT services  PT Diagnosis Difficulty walking   PT Problem List Decreased strength;Decreased activity tolerance;Decreased balance;Decreased mobility;Cardiopulmonary status limiting activity  PT Treatment Interventions DME instruction;Gait training;Stair training;Functional mobility training;Therapeutic activities;Therapeutic exercise;Patient/family education   PT  Goals (Current goals can be found in the Care Plan section) Acute Rehab PT Goals Patient Stated Goal: get better PT Goal Formulation: With patient Time For Goal Achievement: 07/20/15 Potential to Achieve Goals: Good    Frequency Min 3X/week   Barriers to discharge        Co-evaluation               End of Session Equipment Utilized During Treatment: Oxygen Activity Tolerance: Patient limited by fatigue Patient left: in bed;with call bell/phone within reach Nurse Communication: Mobility status         Time: [id number] PT Time Calculation (min) (ACUTE ONLY): 14 min   Charges:   PT Evaluation $PT Eval Moderate Complexity: 1 Procedure     PT G CodesOlen Pel:        Daryel Kenneth Hamff 07/06/2015, 5:27 PM  Van ClinesHolly Cai Flott, PT  Acute Rehabilitation Services Pager [phone] Office [phone]

## 2015-07-06 NOTE — Progress Notes (Signed)
Pharmacy Antibiotic Note  [name] is a 82 y.o. female admitted on 07/04/2015 with pneumonia.  Pharmacy has been consulted for Levaquin dosing.  Plan: - current antibiotics will be continued without adjustments- Levaquin 500mg  IV q48h - follow for clinical progression, renal function, LOT   Weight: 119 lb 3.2 oz (54.069 kg) (per nurse tech)  Temp (24hrs), Avg:98 F (36.7 C), Min:98 F (36.7 C), Max:98 F (36.7 C)   Recent Labs Lab 07/04/15 1248 07/04/15 1316 07/04/15 1506 07/05/15 0511 07/06/15 0438  WBC 16.1*  --   --  18.6* 11.1*  CREATININE 1.55*  --   --  1.52* 1.45*  LATICACIDVEN  --  2.68* 1.89  --   --     Estimated Creatinine Clearance: 23 mL/min (by C-G formula based on Cr of 1.45).    Not on File  Antimicrobials this admission: Vanc 6/26 x1 Cefepime 6/26 x1  LVQ 6/26>>  Dose adjustments this admission: N/a  Microbiology results: 6/26 urine: >100K EColi- pan sens, >100K kleb pneumo- R to amp 6/26 BCx: 2/2 EColi 6/26 BCID: EColi  Thank you for allowing pharmacy to be a part of this patient's care.  [name] D. [name], PharmD, BCPS Clinical Pharmacist Pager: [phone] 07/06/2015 10:47 AM

## 2015-07-06 NOTE — Progress Notes (Signed)
Pharmacy Antibiotic Note  [name] is a 82 y.o. female admitted on 07/04/2015 with bacteremia.  Pharmacy has been consulted for cefazolin dosing.  Pt with E coli bacteremia and UTI. Spoke with Dr. Izola PriceMyers - will narrow even further to cefazolin.   Plan: - Stop Levaquin - Start cefazolin 2gm IV q12h - F/u susceptibilities and LOT  Weight: 119 lb 3.2 oz (54.069 kg) (per nurse tech)  Temp (24hrs), Avg:98 F (36.7 C), Min:98 F (36.7 C), Max:98 F (36.7 C)   Recent Labs Lab 07/04/15 1248 07/04/15 1316 07/04/15 1506 07/05/15 0511 07/06/15 0438  WBC 16.1*  --   --  18.6* 11.1*  CREATININE 1.55*  --   --  1.52* 1.45*  LATICACIDVEN  --  2.68* 1.89  --   --     Estimated Creatinine Clearance: 23 mL/min (by C-G formula based on Cr of 1.45).    Not on File  Antimicrobials this admission: Vanc 6/26 x1 Cefepime 6/26 x1  Levaquin 6/26 >> 6/28 Cefazolin 6/28 >>  Microbiology results: 6/26 UCx: E coli and Kleb pneumo 6/26 BCx: E coli  Thank you for allowing pharmacy to be a part of this patient's care.  Valta Dillon L. Roseanne RenoStewart, PharmD Infectious Diseases Pharmacist Pager: [phone] 07/06/2015 3:16 PM

## 2015-07-07 ENCOUNTER — Telehealth: Payer: Self-pay | Admitting: Internal Medicine

## 2015-07-07 DIAGNOSIS — D696 Thrombocytopenia, unspecified: Secondary | ICD-10-CM

## 2015-07-07 DIAGNOSIS — D638 Anemia in other chronic diseases classified elsewhere: Secondary | ICD-10-CM

## 2015-07-07 DIAGNOSIS — J189 Pneumonia, unspecified organism: Secondary | ICD-10-CM

## 2015-07-07 DIAGNOSIS — E43 Unspecified severe protein-calorie malnutrition: Secondary | ICD-10-CM

## 2015-07-07 DIAGNOSIS — E785 Hyperlipidemia, unspecified: Secondary | ICD-10-CM

## 2015-07-07 DIAGNOSIS — J9611 Chronic respiratory failure with hypoxia: Secondary | ICD-10-CM

## 2015-07-07 DIAGNOSIS — J8410 Pulmonary fibrosis, unspecified: Secondary | ICD-10-CM

## 2015-07-07 DIAGNOSIS — A4151 Sepsis due to Escherichia coli [E. coli]: Principal | ICD-10-CM

## 2015-07-07 LAB — CBC WITH DIFFERENTIAL/PLATELET
Basophils Absolute: 0 10*3/uL (ref 0.0–0.1)
Basophils Relative: 0 %
EOS ABS: 0 10*3/uL (ref 0.0–0.7)
EOS PCT: 0 %
HCT: 33.9 % — ABNORMAL LOW (ref 36.0–46.0)
Hemoglobin: 10.8 g/dL — ABNORMAL LOW (ref 12.0–15.0)
LYMPHS ABS: 0.5 10*3/uL — AB (ref 0.7–4.0)
Lymphocytes Relative: 3 %
MCH: 29.3 pg (ref 26.0–34.0)
MCHC: 31.9 g/dL (ref 30.0–36.0)
MCV: 91.9 fL (ref 78.0–100.0)
MONO ABS: 0.9 10*3/uL (ref 0.1–1.0)
MONOS PCT: 6 %
Neutro Abs: 14.7 10*3/uL — ABNORMAL HIGH (ref 1.7–7.7)
Neutrophils Relative %: 91 %
PLATELETS: 151 10*3/uL (ref 150–400)
RBC: 3.69 MIL/uL — ABNORMAL LOW (ref 3.87–5.11)
RDW: 14.6 % (ref 11.5–15.5)
WBC: 16.1 10*3/uL — ABNORMAL HIGH (ref 4.0–10.5)

## 2015-07-07 LAB — CULTURE, BLOOD (ROUTINE X 2)

## 2015-07-07 LAB — BASIC METABOLIC PANEL
Anion gap: 10 (ref 5–15)
Anion gap: 10 (ref 5–15)
BUN: 35 mg/dL — AB (ref 6–20)
BUN: 37 mg/dL — AB (ref 6–20)
CHLORIDE: 104 mmol/L (ref 101–111)
CHLORIDE: 101 mmol/L (ref 101–111)
CO2: 27 mmol/L (ref 22–32)
CO2: 29 mmol/L (ref 22–32)
CREATININE: 1.38 mg/dL — AB (ref 0.44–1.00)
CREATININE: 1.43 mg/dL — AB (ref 0.44–1.00)
Calcium: 9.4 mg/dL (ref 8.9–10.3)
Calcium: 9 mg/dL (ref 8.9–10.3)
GFR calc Af Amer: 39 mL/min — ABNORMAL LOW (ref >60)
GFR calc Af Amer: 37 mL/min — ABNORMAL LOW (ref >60)
GFR calc non Af Amer: 34 mL/min — ABNORMAL LOW (ref >60)
GFR, EST NON AFRICAN AMERICAN: 32 mL/min — AB (ref >60)
GLUCOSE: 187 mg/dL — AB (ref 65–99)
GLUCOSE: 223 mg/dL — AB (ref 65–99)
POTASSIUM: 4.8 mmol/L (ref 3.5–5.1)
POTASSIUM: 3.4 mmol/L — AB (ref 3.5–5.1)
SODIUM: 141 mmol/L (ref 135–145)
SODIUM: 140 mmol/L (ref 135–145)

## 2015-07-07 LAB — CBC
HCT: 34.8 % — ABNORMAL LOW (ref 36.0–46.0)
Hemoglobin: 11 g/dL — ABNORMAL LOW (ref 12.0–15.0)
MCH: 29.6 pg (ref 26.0–34.0)
MCHC: 31.6 g/dL (ref 30.0–36.0)
MCV: 93.5 fL (ref 78.0–100.0)
Platelets: 211 10*3/uL (ref 150–400)
RBC: 3.72 MIL/uL — AB (ref 3.87–5.11)
RDW: 14.8 % (ref 11.5–15.5)
WBC: 27.3 10*3/uL — ABNORMAL HIGH (ref 4.0–10.5)

## 2015-07-07 LAB — TROPONIN I
TROPONIN I: 0.1 ng/mL — AB (ref <0.03)
Troponin I: 0.09 ng/mL (ref <0.03)

## 2015-07-07 MED ORDER — ZOLPIDEM TARTRATE 5 MG PO TABS
2.5000 mg | ORAL_TABLET | Freq: Once | ORAL | Status: AC | PRN
  Administered 2015-07-07: 2.5 mg via ORAL
  Filled 2015-07-07: qty 1

## 2015-07-07 MED ORDER — CETYLPYRIDINIUM CHLORIDE 0.05 % MT LIQD
7.0000 mL | Freq: Two times a day (BID) | OROMUCOSAL | Status: DC
  Administered 2015-07-07 – 2015-07-09 (×5): 7 mL via OROMUCOSAL

## 2015-07-07 MED ORDER — ZOLPIDEM TARTRATE 5 MG PO TABS
2.5000 mg | ORAL_TABLET | Freq: Every evening | ORAL | Status: DC | PRN
  Administered 2015-07-07 – 2015-07-08 (×2): 2.5 mg via ORAL
  Filled 2015-07-07 (×2): qty 1

## 2015-07-07 NOTE — Progress Notes (Signed)
CRITICAL VALUE ALERT  Critical value received: Troponin 0.14  Date of notification:  07/07/15  Time of notification: 0102  Critical value read back:Yes.    Nurse who received alert:  Nelda MarseilleJenny Thacker, RN  MD notified (1st page): Craige CottaKirby, NP via text page in [place].  Time of first page:  0108  MD notified (2nd page): Craige CottaKirby, NP via text page  Time of second page: 0137  Responding MD: No call returned by NP  Time MD responded:

## 2015-07-07 NOTE — Discharge Summary (Signed)
Physician Discharge Summary  [name] YHC:[id number] DOB: Jan 01, 1928 DOA: 07/04/2015  PCP: Lujean Amel, MD  Admit date: 07/04/2015 Discharge date: 07/09/15  Time spent: 40 minutes  Recommendations for Outpatient Follow-up:  Patient will be discharged to [facility] for rehab.  Patient will need to follow up with primary care provider within one week of discharge.  Patient should continue medications as prescribed.  Patient should follow a regular diet.    Discharge Diagnoses:  Principal Problem:   Acute on chronic respiratory failure (HCC) Active Problems:   Hypothyroidism   Pulmonary fibrosis (HCC)   Non-alcoholic cirrhosis (HCC)   Chronic respiratory failure with hypoxia (HCC)   GERD (gastroesophageal reflux disease)   History of non-ST elevation myocardial infarction (NSTEMI)   HLD (hyperlipidemia)   HCAP (healthcare-associated pneumonia)   Acute cystitis with hematuria   Sepsis ([place])   Thrombocytopenia (HCC)   Anemia of chronic disease   Acute kidney injury superimposed on chronic kidney disease ([place])   Severe protein-calorie malnutrition ([place])   Discharge Condition: Improved but ongoing weakness and significant DOE  Diet recommendation: Regular with Boost Breeze TID  Filed Weights   07/05/15 1611  Weight: 54.069 kg (119 lb 3.2 oz)    History of present illness:  82 y.o. female with medical history significant for COPD asthma/history of frequent pneumonias/pulmonary fibrosis, on home O2 2 Liters, Hypothyroidism, CAD, non ETOH cirrhosis, GERD, presenting with main concern of 1-2 weeks duration of progressively worsening dyspnea that initially started with exertion and has progressed to dyspnea at rest in the past 48 hours, PTA. This was associated with fevers, cough productive of yellow sputum with streaks of blood in it.   Please note that pt should meet criteria for inpatient stay, she was admitted for acute respiratory failure secondary to HCAP, E. Coli  bacteremia and UTI. Needs IV ABX, IV solumedrol. She required at least 3-4 days in the hospital before medications could be changed to PO.   Acuteon chronic respiratory failure with hypoxia likely related to HCAP, unknown pathogen - imposed on known COPD and pulmonary fibrosis steroid dependent on 10 mg PO QD - recent admission for NSTEMI in 05/2015 - Patient on home O2 2 liters, currently 96% on 2.5L [place] (changed parameters, as 88-92% is reasonable for her) - She was started on Solumedrol 40 mg BID IV 6/27 and changed to Prednisone; will slowly taper down from 50 mg tab daily to 10 mg daily until completed  - d/w pharmacy choice of abx, will change from Levaquin to Ancef for total of 10 days - Strep pneumoniae and Legionella negative - sputum cultures were never collected - provide nebulizers scheduled and as needed - encouraged use of flutter valve while awake   Sepsis due to HCAP, bacteremia E. Coli, UTI with E. Coli and Klebsiella  - please note that pt has met criteria for sepsis with T > 100.5 F, WBC 16 K, lactic acid 2.68, RR > 22 bpm, elevated procalcitonin level > 5 - E. Coli bacteremia, E. Coli and Klebsiella UTI - changed Levaquin to Cefazolin, will treat for a total of 10 days on this medication - no further fevers, WBC count 16.7 on 7/1, suggest outpatient f/u - lactic acid has fortunately cleared   Acute exacerbation of COPD - significant improvement in respiratory status but does have physical exertion limitation based on dyspnea - place back on Prednisone and taper down as indicated above  - continue BD's scheduled and as needed   CAD - EKG without  ACS, last cardiac catheterization with stent in 05/2015 - Continue ASA, Brillinta high dose statin, beta blockers   Hypokalemia - mild but persistent, will start daily oral potassium supplementation  Hyperlipidemia - Continue home statins  Hypothyroidism - Continue home Synthroid  Severe malnutrition in context of  chronic illness Per nutrition: -D/c Ensure Enlive po TID, due to poor acceptance -Boost Breeze po TID, each supplement provides 250 kcal and 9 grams of protein  GERD - with history of GIB in the past , no acute symptoms: - on PPI   Thrombocytopenia, anemia of chronic disease  - Likely due to non alcoholic cirrhosis - no indication for transfusion - Both stable from apparent usual baseline (suspect that 6/30 values were diluted)  Acute kidney injury imposed on Chronic kidney disease stage 3  - secondary to sepsis etiology  - with baseline Cr ~ 1.2 - up to 1.51 on 6/30, will give gentle IVF bolus of 500 cc over 5 hours - back to 1.34 on 7/1, stable for discharge and recommend outpatient f/u  Procedures:  None  Consultations:  None  Disposition:  PT and patient agreed that she is not safe to return home at this time and could benefit from short-term SNF for rehab.  Patient requests to go to [facility]  Discharge Exam: Filed Vitals:   07/09/15 0607 07/09/15 0948  BP: 134/67 138/66  Pulse: 94   Temp: 98.8 F (37.1 C)   Resp: 18      General: Well developed, well nourished, NAD, appears stated age.  She is somewhat tachypneic at baseline, but this worsens with exertion/conversation.    HEENT: NCAT, PERRLA, EOMI, Anicteic Sclera, mucous membranes moist.  Neck: Supple, no JVD, no masses  Cardiovascular: S1 S2 auscultated, no rubs, murmurs or gallops. Irregularly irregular rate and rhythm.  Respiratory: Clear to auscultation bilaterally with equal chest rise, mildly increased WOB that is improved from prior, scattered wheezes  Abdomen: Soft, nontender, nondistended, + bowel sounds  Extremities: warm dry without cyanosis clubbing or edema  Neuro: AAOx3, cranial nerves grossly intact. Strength 5/5 in patient's upper and lower extremities bilaterally  Skin: Without rashes exudates or nodules  Psych: Normal affect and demeanor with intact judgement and  insight  Discharge Instructions      Discharge Instructions    Call MD for:  difficulty breathing, headache or visual disturbances    Complete by:  As directed      Call MD for:  extreme fatigue    Complete by:  As directed      Call MD for:  persistant dizziness or light-headedness    Complete by:  As directed      Call MD for:  temperature >100.4    Complete by:  As directed      Diet - low sodium heart healthy    Complete by:  As directed      Discharge instructions    Complete by:  As directed   Patient will be discharged to [facility] for [facility]hab.  Patient will need to follow up with primary care provider within one week of discharge.  Patient should continue medications as prescribed.  Patient should follow a regular diet.     Increase activity slowly    Complete by:  As directed             Medication List    STOP taking these medications        amoxicillin-clavulanate 875-125 MG tablet  Commonly known as:  AUGMENTIN  famotidine 20 MG tablet  Commonly known as:  PEPCID     predniSONE 5 MG tablet  Commonly known as:  DELTASONE  Replaced by:  predniSONE 10 MG (21) Tbpk tablet  You also have another medication with the same name that you need to continue taking as instructed.      TAKE these medications        albuterol (5 MG/ML) 0.5% nebulizer solution  Commonly known as:  PROVENTIL  Take 2.5 mg by nebulization every 4 (four) hours as needed for wheezing or shortness of breath.     albuterol 108 (90 Base) MCG/ACT inhaler  Commonly known as:  PROVENTIL HFA;VENTOLIN HFA  Inhale 2 puffs into the lungs every 4 (four) hours as needed for wheezing or shortness of breath. For shortness of breath     amLODipine 2.5 MG tablet  Commonly known as:  NORVASC  Take 2.5 mg by mouth every morning.     aspirin 81 MG tablet  Take 81 mg by mouth daily.     atorvastatin 40 MG tablet  Commonly known as:  LIPITOR  Take 1 tablet (40 mg total) by mouth daily at 6 PM.      CALCITRATE PO  Take 600 mg by mouth daily.     ceFAZolin 2-4 GM/100ML-% IVPB  Commonly known as:  ANCEF  Inject 100 mLs (2 g total) into the vein every 12 (twelve) hours.     chlorpheniramine 4 MG tablet  Commonly known as:  CHLOR-TRIMETON  Take 4 mg by mouth at bedtime.     DELSYM 30 MG/5ML liquid  Generic drug:  dextromethorphan  Take 30 mg by mouth 2 (two) times daily as needed for cough. Take 2 tsp twice a day as needed for cough     diphenhydramine-acetaminophen 25-500 MG Tabs tablet  Commonly known as:  TYLENOL PM  Take 1-2 tablets by mouth at bedtime as needed (sleep).     feeding supplement Liqd  Take 1 Container by mouth 3 (three) times daily between meals.     hydrochlorothiazide 25 MG tablet  Commonly known as:  HYDRODIURIL  Take 25 mg by mouth every morning.     ipratropium 0.06 % nasal spray  Commonly known as:  ATROVENT  Place 2 sprays into both nostrils 4 (four) times daily as needed for rhinitis.     ipratropium-albuterol 0.5-2.5 (3) MG/3ML Soln  Commonly known as:  DUONEB  Take 3 mLs by nebulization 3 (three) times daily.     irbesartan 300 MG tablet  Commonly known as:  AVAPRO  Take 300 mg by mouth every morning.     levothyroxine 88 MCG tablet  Commonly known as:  SYNTHROID, LEVOTHROID  Take 88 mcg by mouth daily before breakfast.     Magnesium 250 MG Tabs  Take 1 tablet by mouth every morning.     methocarbamol 500 MG tablet  Commonly known as:  ROBAXIN  Take 500 mg by mouth 2 (two) times daily.     nebivolol 10 MG tablet  Commonly known as:  BYSTOLIC  Take 1 tablet (10 mg total) by mouth daily.     nitroGLYCERIN 0.4 MG SL tablet  Commonly known as:  NITROSTAT  Place 1 tablet (0.4 mg total) under the tongue every 5 (five) minutes x 3 doses as needed for chest pain.     OXYGEN  Inhale 2 L into the lungs daily as needed (for sleep and with exertion). 2 l/m with sleep and exertion  pantoprazole 40 MG tablet  Commonly known as:  PROTONIX   Take 40 mg by mouth 2 (two) times daily before a meal.     predniSONE 10 MG (21) Tbpk tablet  Commonly known as:  STERAPRED UNI-PAK 21 TAB  Take 1 tablet (10 mg total) by mouth daily. 50 mg daily x 3 days, then 40 mg daily x 3 days, then 30 mg daily x 3 days, then 20 mg daily x 3 days     predniSONE 10 MG tablet  Commonly known as:  DELTASONE  Take 1 tablet (10 mg total) by mouth daily with breakfast. Start this medication after completing the prednisone taper.     SYSTANE 0.4-0.3 % Soln  Generic drug:  Polyethyl Glycol-Propyl Glycol  Place 1 drop into both eyes daily as needed (for dry eyes).     ticagrelor 90 MG Tabs tablet  Commonly known as:  BRILINTA  Take 1 tablet (90 mg total) by mouth 2 (two) times daily.     traMADol 50 MG tablet  Commonly known as:  ULTRAM  TAKE 1 TABLET BY MOUTH EVERY 4 HOURS FOR PAIN OR COUGH       Not on File Follow-up Information    Follow up with Lujean Amel, MD In 1 week.   Specialty:  Family Medicine   Why:  for hospital follow-up; please have SNF draw CBC, BMP within next 3-4 days, as well   Contact information:   [address] [place] [id number] [phone]        The results of significant diagnostics from this hospitalization (including imaging, microbiology, ancillary and laboratory) are listed below for reference.    Significant Diagnostic Studies: Dg Chest 2 View  07/05/2015  CLINICAL DATA:  Shortness of breath for several days EXAM: CHEST  2 VIEW COMPARISON:  July 04, 2015 and July 14, 2014 chest radiograph; chest CT September 29, 2014 FINDINGS: There is extensive underlying fibrosis. Lungs are somewhat hyperexpanded, likely due to underlying obstructive pulmonary disease. The overall extent of fibrosis and scarring appear stable. No frank edema or consolidation is evident. Heart is upper normal in size, stable. The pulmonary vascularity is within normal limits. No adenopathy is evident. Bones are  osteoporotic. There is postoperative change in the right shoulder. Sclerosis is noted in the right humeral head. There is lower thoracic dextroscoliosis with upper lumbar levoscoliosis. There is atherosclerotic calcification in the aorta. IMPRESSION: Underlying obstructive pulmonary disease with fibrosis, stable. No edema or consolidation is noted. Subtle infiltrate could easily be obscured given the degree of fibrosis which is present. Stable cardiac silhouette. Aortic atherosclerosis noted. Postoperative change in the right shoulder. Question a degree of avascular necrosis in the right humeral head. Electronically Signed   By: Lowella Grip III M.D.   On: 07/05/2015 07:44   Dg Chest 2 View  07/04/2015  CLINICAL DATA:  Shortness of breath. EXAM: CHEST  2 VIEW COMPARISON:  Radiographs dated 06/03/2015, 10/12/2014 and 07/14/2014 and chest CT dated 09/29/2014 FINDINGS: The patient has severe chronic interstitial and obstructive lung disease with multiple areas of parenchymal scarring bilaterally which are stable. No acute infiltrates or effusions. Diffuse osteopenia with straightening of the thoracic kyphosis. Aortic atherosclerosis. Chronic thoracolumbar scoliosis. No acute bone abnormality. IMPRESSION: No acute abnormality.  Severe chronic lung disease. Electronically Signed   By: Lorriane Shire M.D.   On: 07/04/2015 13:19   Dg Chest Port 1 View  07/06/2015  CLINICAL DATA:  Cough and shortness of breath. EXAM:  PORTABLE CHEST 1 VIEW COMPARISON:  07/05/2015 FINDINGS: At 2004 hours. Lungs are hyperexpanded. Underlying chronic interstitial lung disease noted with superimposition of interstitial in diffuse patchy alveolar opacity on today's study. Cardiopericardial silhouette appears enlarged. Bones are diffusely demineralized. Telemetry leads overlie the chest. IMPRESSION: Imaging features today likely related to pulmonary edema superimposed on advanced chronic underlying interstitial lung disease. Diffuse  infection could also have this appearance. Electronically Signed   By: Misty Stanley M.D.   On: 07/06/2015 20:37    Microbiology: Recent Results (from the past 240 hour(s))  Culture, blood (routine x 2)     Status: Abnormal   Collection Time: 07/04/15  2:53 PM  Result Value Ref Range Status   Specimen Description BLOOD RIGHT ANTECUBITAL  Final   Special Requests BOTTLES DRAWN AEROBIC AND ANAEROBIC 5CC  Final   Culture  Setup Time   Final    GRAM NEGATIVE RODS IN BOTH AEROBIC AND ANAEROBIC BOTTLES CRITICAL RESULT CALLED TO, READ BACK BY AND VERIFIED WITH: Ailene Rud AT 4403 07/05/15 BY M KELLY    Culture ESCHERICHIA COLI (A)  Final   Report Status 07/07/2015 FINAL  Final   Organism ID, Bacteria ESCHERICHIA COLI  Final      Susceptibility   Escherichia coli - MIC*    AMPICILLIN <=2 SENSITIVE Sensitive     CEFAZOLIN <=4 SENSITIVE Sensitive     CEFEPIME <=1 SENSITIVE Sensitive     CEFTAZIDIME <=1 SENSITIVE Sensitive     CEFTRIAXONE <=1 SENSITIVE Sensitive     CIPROFLOXACIN <=0.25 SENSITIVE Sensitive     GENTAMICIN <=1 SENSITIVE Sensitive     IMIPENEM <=0.25 SENSITIVE Sensitive     TRIMETH/SULFA <=20 SENSITIVE Sensitive     AMPICILLIN/SULBACTAM <=2 SENSITIVE Sensitive     PIP/TAZO <=4 SENSITIVE Sensitive     * ESCHERICHIA COLI  Blood Culture ID Panel (Reflexed)     Status: Abnormal   Collection Time: 07/04/15  2:53 PM  Result Value Ref Range Status   Enterococcus species NOT DETECTED NOT DETECTED Final   Vancomycin resistance NOT DETECTED NOT DETECTED Final   Listeria monocytogenes NOT DETECTED NOT DETECTED Final   Staphylococcus species NOT DETECTED NOT DETECTED Final   Staphylococcus aureus NOT DETECTED NOT DETECTED Final   Methicillin resistance NOT DETECTED NOT DETECTED Final   Streptococcus species NOT DETECTED NOT DETECTED Final   Streptococcus agalactiae NOT DETECTED NOT DETECTED Final   Streptococcus pneumoniae NOT DETECTED NOT DETECTED Final   Streptococcus  pyogenes NOT DETECTED NOT DETECTED Final   Acinetobacter baumannii NOT DETECTED NOT DETECTED Final   Enterobacteriaceae species DETECTED (A) NOT DETECTED Final    Comment: CRITICAL RESULT CALLED TO, READ BACK BY AND VERIFIED WITH: MIKE MACCIA,PHARMD _0  07/05/15 MKELLY    Enterobacter cloacae complex NOT DETECTED NOT DETECTED Final   Escherichia coli DETECTED (A) NOT DETECTED Final    Comment: CRITICAL RESULT CALLED TO, READ BACK BY AND VERIFIED WITH: MIKE MACCIA,PHARMD _1  07/05/15 KELLYM    Klebsiella oxytoca NOT DETECTED NOT DETECTED Final   Klebsiella pneumoniae NOT DETECTED NOT DETECTED Final   Proteus species NOT DETECTED NOT DETECTED Final   Serratia marcescens NOT DETECTED NOT DETECTED Final   Carbapenem resistance NOT DETECTED NOT DETECTED Final   Haemophilus influenzae NOT DETECTED NOT DETECTED Final   Neisseria meningitidis NOT DETECTED NOT DETECTED Final   Pseudomonas aeruginosa NOT DETECTED NOT DETECTED Final   Candida albicans NOT DETECTED NOT DETECTED Final   Candida glabrata NOT DETECTED NOT DETECTED Final   Candida  krusei NOT DETECTED NOT DETECTED Final   Candida parapsilosis NOT DETECTED NOT DETECTED Final   Candida tropicalis NOT DETECTED NOT DETECTED Final  Urine culture     Status: Abnormal   Collection Time: 07/04/15  2:54 PM  Result Value Ref Range Status   Specimen Description URINE, CLEAN CATCH  Final   Special Requests NONE  Final   Culture (A)  Final    >=100,000 COLONIES/mL KLEBSIELLA PNEUMONIAE >=100,000 COLONIES/mL ESCHERICHIA COLI    Report Status 07/06/2015 FINAL  Final   Organism ID, Bacteria KLEBSIELLA PNEUMONIAE (A)  Final   Organism ID, Bacteria ESCHERICHIA COLI (A)  Final      Susceptibility   Escherichia coli - MIC*    AMPICILLIN <=2 SENSITIVE Sensitive     CEFAZOLIN <=4 SENSITIVE Sensitive     CEFTRIAXONE <=1 SENSITIVE Sensitive     CIPROFLOXACIN <=0.25 SENSITIVE Sensitive     GENTAMICIN <=1 SENSITIVE Sensitive     IMIPENEM <=0.25  SENSITIVE Sensitive     NITROFURANTOIN <=16 SENSITIVE Sensitive     TRIMETH/SULFA <=20 SENSITIVE Sensitive     AMPICILLIN/SULBACTAM <=2 SENSITIVE Sensitive     PIP/TAZO <=4 SENSITIVE Sensitive     * >=100,000 COLONIES/mL ESCHERICHIA COLI   Klebsiella pneumoniae - MIC*    AMPICILLIN 16 RESISTANT Resistant     CEFAZOLIN <=4 SENSITIVE Sensitive     CEFTRIAXONE <=1 SENSITIVE Sensitive     CIPROFLOXACIN <=0.25 SENSITIVE Sensitive     GENTAMICIN <=1 SENSITIVE Sensitive     IMIPENEM <=0.25 SENSITIVE Sensitive     NITROFURANTOIN <=16 SENSITIVE Sensitive     TRIMETH/SULFA <=20 SENSITIVE Sensitive     AMPICILLIN/SULBACTAM 4 SENSITIVE Sensitive     PIP/TAZO 8 SENSITIVE Sensitive     * >=100,000 COLONIES/mL KLEBSIELLA PNEUMONIAE  Culture, blood (routine x 2)     Status: Abnormal   Collection Time: 07/04/15  2:56 PM  Result Value Ref Range Status   Specimen Description BLOOD LEFT HAND  Final   Special Requests BOTTLES DRAWN AEROBIC AND ANAEROBIC 5CC  Final   Culture  Setup Time   Final    GRAM NEGATIVE RODS IN BOTH AEROBIC AND ANAEROBIC BOTTLES CRITICAL RESULT CALLED TO, READ BACK BY AND VERIFIED WITH: M MACCIA,PHARMD AT 4650 07/05/15 BY M KELLY    Culture (A)  Final    ESCHERICHIA COLI SUSCEPTIBILITIES PERFORMED ON PREVIOUS CULTURE WITHIN THE LAST 5 DAYS.    Report Status 07/07/2015 FINAL  Final     Labs: Basic Metabolic Panel:  Recent Labs Lab 07/06/15 0438 07/06/15 2201 07/07/15 1002 07/08/15 0549 07/09/15 0442  NA 138 141 140 139 143  K 3.3* 4.8 3.4* 3.2* 3.5  CL 107 104 101 99* 102  CO2 _0 32 29  GLUCOSE 236* 187* 223* 176* 156*  BUN 38* 35* 37* 38* 36*  CREATININE 1.45* 1.38* 1.43* 1.51* 1.34*  CALCIUM 8.0* 9.4 9.0 8.6* 8.9   Liver Function Tests:  Recent Labs Lab 07/04/15 1248 07/05/15 0511  AST 38 28  ALT 49 31  ALKPHOS 83 69  BILITOT 1.0 1.1  PROT 5.6* 4.5*  ALBUMIN 2.8* 2.1*   No results for input(s): LIPASE, AMYLASE in the last 168 hours. No  results for input(s): AMMONIA in the last 168 hours. CBC:  Recent Labs Lab 07/04/15 1248  07/06/15 0438 07/06/15 2201 07/07/15 1002 07/08/15 0549 07/09/15 0442  WBC 16.1*  < > 11.1* 27.3* 16.1* 11.3* 16.7*  NEUTROABS 14.1*  --   --   --  14.7* 10.0* 14.9*  HGB 10.4*  < > 8.7* 11.0* 10.8* 9.5* 10.6*  HCT 32.6*  < > 27.4* 34.8* 33.9* 29.7* 34.3*  MCV 93.1  < > 90.7 93.5 91.9 92.5 94.8  PLT 122*  < > 97* 211 151 121* 198  < > = values in this interval not displayed. Cardiac Enzymes:  Recent Labs Lab 07/06/15 2201 07/07/15 0258 07/07/15 1002  TROPONINI 0.14* 0.10* 0.09*   BNP: BNP (last 3 results)  Recent Labs  09/28/14 1335 06/03/15 1615  BNP 345.2* 1294.0*    ProBNP (last 3 results) No results for input(s): PROBNP in the last 8760 hours.  CBG: No results for input(s): GLUCAP in the last 168 hours.     Signed:  Karmen Bongo  [facility] 07/09/2015, 10:29 AM

## 2015-07-07 NOTE — Progress Notes (Signed)
PROGRESS NOTE    [name]  ZOX:[id number] DOB: Sep 02, 1927 DOA: 07/04/2015 PCP: Lujean Amel, MD  Outpatient Specialists:     Brief Narrative: 82 y.o. female with medical history significant for COPD asthma/history of frequent pneumonias/pulmonary fibrosis, on home O2 2 Liters, Hypothyroidism, CAD, non ETOH cirrhosis, GERD, presenting with main concern of 1-2 weeks duration of progressively worsening dyspnea that initially started with exertion and has progressed to dyspnea at rest in the past 48 hours, PTA. This was associated with fevers, cough productive of yellow sputum with streaks of blood in it.   Please note that pt should meet criteria for inpatient stay, she was admitted for acute respiratory failure secondary to HCAP, E. Coli bacteremia and UTI. Needs IV ABX, IV solumedrol. She will require at least 3-4 days in the hospital before medications can be changed to PO.   Assessment & Plan:   Principal Problem:   HCAP (healthcare-associated pneumonia) Active Problems:   Hypothyroidism   Pulmonary fibrosis (HCC)   OA (osteoarthritis)   Asthma   Non-alcoholic cirrhosis (HCC)   Fever   Chronic respiratory failure with hypoxia (HCC)   GERD (gastroesophageal reflux disease)   Cirrhosis (HCC)   History of non-ST elevation myocardial infarction (NSTEMI)   HLD (hyperlipidemia)   Acute cystitis with hematuria   Sepsis ([place])   Acuteon chronic respiratory failure with hypoxia likely related to HCAP, unknown pathogen - imposed on known COPD and pulmonary fibrosis steroid dependent on 10 mg PO QD - recent admission for NSTEMI in 05/2015 - Patient on home O2 2 liters, currently 88-92% on 4 L [place] (changed parameters, as 88-92% is reasonable for her) - pt looks clinically stable this AM and reports feeling better, her breath sounds are much clearer and with only minimal wheezing  - She was started on Solumedrol 40 mg BID IV 6/27 and changed to Prednisone; would start with 50 mg  tab daily and taper down by 10 mg daily until completed  - d/w pharmacy choice of abx, will change from Levaquin to Ancef  - follow up on sputum cultures - provide nebulizers scheduled and as needed - encouraged use of flutter valve while awake   Sepsis due to HCAP, bacteremia E. Coli, UTI with E. Coli and Klebsiella  - please note that pt has met criteria for sepsis with T > 100.5 F, WBC 16 K, lactic acid 2.68, RR > 22 bpm, elevated procalcitonin level > 5 - E. Coli bacteremia, E. Coli and Klebsiella UTI - changed Levaquin to Cefazolin  - follow up on final blood and urine cultures, sputum cultures - no further fevers, WBC count now 16.1 (down from 27.3) - lactic acid has fortunately cleared   Acute exacerbation of COPD - significant improvement in respiratory status  - place back on Prednisone and taper down as indicated above  - continue BD's scheduled and as needed   CAD - EKG without ACS, last cardiac catheterization with stent in 05/2015 - Continue ASA, Brillinta high dose statin, beta blockers - no chest pain this AM   Hypokalemia - mild, supplement and repeat BMP In AM  Hyperlipidemia - Continue home statins  Hypothyroidism - Continue home Synthroid  GERD - with history of GIB in the past , no acute symptoms: - on PPI   Thrombocytopenia, anemia of chronic disease  - Likely due to non alcoholic cirrhosis, current count 90 K - no indication for transfusion - CBC in AM  Acute kidney injury imposed on Chronic kidney disease  stage 3  - secondary to sepsis etiology  - with baseline Cr ~ 1.2 - up to 1.43 - monitor closely  - BMP In AM   DVT prophylaxis: SCD Code Status: Full  Family Communication: one Disposition Plan: Home in 1-2 days if WBC is stable and no fevers  Consultants:   None  Procedures:   None  Antimicrobials:   Vanc and Zosyn x 1 6/26  6/26 Levaquin --> 6/27  6/28 Ancef (Cefazolin) -->   Subjective: Reports feeling well  and wanting to go home.  However, persistent dyspnea, has not been out of bed, still with increased O2 requirement compared to home O2.  Objective: Filed Vitals:   07/07/15 0514 07/07/15 0917 07/07/15 1406 07/07/15 1449  BP: 119/53   119/63  Pulse: 98   66  Temp: 98.1 F (36.7 C)   99 F (37.2 C)  TempSrc:    Oral  Resp: 18   18  Weight:      SpO2: 95% 97% 93% 92%    Intake/Output Summary (Last 24 hours) at 07/07/15 1803 Last data filed at 07/07/15 1444  Gross per 24 hour  Intake    440 ml  Output   4250 ml  Net  -3810 ml   Filed Weights   07/05/15 1611  Weight: 54.069 kg (119 lb 3.2 oz)    Examination:  General exam: Appears calm and comfortable  Respiratory system: Clear to auscultation. Respiratory effort normal. Cardiovascular system: S1 & S2 heard, RRR. No JVD, murmurs, rubs, gallops or clicks. No pedal edema. Gastrointestinal system: Abdomen is nondistended, soft and nontender. No organomegaly or masses felt. Normal bowel sounds heard. Central nervous system: Alert and oriented. No focal neurological deficits. Extremities: Symmetric 5 x 5 power. Skin: No rashes, lesions or ulcers Psychiatry: Judgement and insight appear normal. Mood & affect appropriate.     Data Reviewed: I have personally reviewed following labs and imaging studies  CBC:  Recent Labs Lab 07/04/15 1248 07/05/15 0511 07/06/15 0438 07/06/15 2201 07/07/15 1002  WBC 16.1* 18.6* 11.1* 27.3* 16.1*  NEUTROABS 14.1*  --   --   --  14.7*  HGB 10.4* 8.9* 8.7* 11.0* 10.8*  HCT 32.6* 28.0* 27.4* 34.8* 33.9*  MCV 93.1 93.3 90.7 93.5 91.9  PLT 122* 93* 97* 211 419   Basic Metabolic Panel:  Recent Labs Lab 07/04/15 1248 07/05/15 0511 07/06/15 0438 07/06/15 2201 07/07/15 1002  NA 137 139 138 141 140  K 4.0 3.6 3.3* 4.8 3.4*  CL 102 108 107 104 101  CO2 _0 GLUCOSE 150* 116* 236* 187* 223*  BUN 43* 36* 38* 35* 37*  CREATININE 1.55* 1.52* 1.45* 1.38* 1.43*  CALCIUM 9.0 8.0*  8.0* 9.4 9.0   GFR: Estimated Creatinine Clearance: 23.4 mL/min (by C-G formula based on Cr of 1.43). Liver Function Tests:  Recent Labs Lab 07/04/15 1248 07/05/15 0511  AST 38 28  ALT 49 31  ALKPHOS 83 69  BILITOT 1.0 1.1  PROT 5.6* 4.5*  ALBUMIN 2.8* 2.1*   No results for input(s): LIPASE, AMYLASE in the last 168 hours. No results for input(s): AMMONIA in the last 168 hours. Coagulation Profile: No results for input(s): INR, PROTIME in the last 168 hours. Cardiac Enzymes:  Recent Labs Lab 07/06/15 2201 07/07/15 0258 07/07/15 1002  TROPONINI 0.14* 0.10* 0.09*   BNP (last 3 results) No results for input(s): PROBNP in the last 8760 hours. HbA1C: No results for input(s): HGBA1C in the  last 72 hours. CBG: No results for input(s): GLUCAP in the last 168 hours. Lipid Profile: No results for input(s): CHOL, HDL, LDLCALC, TRIG, CHOLHDL, LDLDIRECT in the last 72 hours. Thyroid Function Tests: No results for input(s): TSH, T4TOTAL, FREET4, T3FREE, THYROIDAB in the last 72 hours. Anemia Panel: No results for input(s): VITAMINB12, FOLATE, FERRITIN, TIBC, IRON, RETICCTPCT in the last 72 hours. Urine analysis:    Component Value Date/Time   COLORURINE YELLOW 07/04/2015 1454   APPEARANCEUR CLOUDY* 07/04/2015 1454   LABSPEC 1.022 07/04/2015 1454   PHURINE 5.5 07/04/2015 1454   GLUCOSEU NEGATIVE 07/04/2015 1454   HGBUR SMALL* 07/04/2015 1454   [place] 07/04/2015 1454   KETONESUR NEGATIVE 07/04/2015 1454   PROTEINUR 30* 07/04/2015 1454   UROBILINOGEN 1.0 09/28/2014 1455   NITRITE NEGATIVE 07/04/2015 1454   LEUKOCYTESUR MODERATE* 07/04/2015 1454   Sepsis Labs: _0 (procalcitonin:4,lacticidven:4)  ) Recent Results (from the past 240 hour(s))  Culture, blood (routine x 2)     Status: Abnormal   Collection Time: 07/04/15  2:53 PM  Result Value Ref Range Status   Specimen Description BLOOD RIGHT ANTECUBITAL  Final   Special Requests BOTTLES DRAWN  AEROBIC AND ANAEROBIC 5CC  Final   Culture  Setup Time   Final    GRAM NEGATIVE RODS IN BOTH AEROBIC AND ANAEROBIC BOTTLES CRITICAL RESULT CALLED TO, READ BACK BY AND VERIFIED WITH: Ailene Rud AT 8295 07/05/15 BY M KELLY    Culture ESCHERICHIA COLI (A)  Final   Report Status 07/07/2015 FINAL  Final   Organism ID, Bacteria ESCHERICHIA COLI  Final      Susceptibility   Escherichia coli - MIC*    AMPICILLIN <=2 SENSITIVE Sensitive     CEFAZOLIN <=4 SENSITIVE Sensitive     CEFEPIME <=1 SENSITIVE Sensitive     CEFTAZIDIME <=1 SENSITIVE Sensitive     CEFTRIAXONE <=1 SENSITIVE Sensitive     CIPROFLOXACIN <=0.25 SENSITIVE Sensitive     GENTAMICIN <=1 SENSITIVE Sensitive     IMIPENEM <=0.25 SENSITIVE Sensitive     TRIMETH/SULFA <=20 SENSITIVE Sensitive     AMPICILLIN/SULBACTAM <=2 SENSITIVE Sensitive     PIP/TAZO <=4 SENSITIVE Sensitive     * ESCHERICHIA COLI  Blood Culture ID Panel (Reflexed)     Status: Abnormal   Collection Time: 07/04/15  2:53 PM  Result Value Ref Range Status   Enterococcus species NOT DETECTED NOT DETECTED Final   Vancomycin resistance NOT DETECTED NOT DETECTED Final   Listeria monocytogenes NOT DETECTED NOT DETECTED Final   Staphylococcus species NOT DETECTED NOT DETECTED Final   Staphylococcus aureus NOT DETECTED NOT DETECTED Final   Methicillin resistance NOT DETECTED NOT DETECTED Final   Streptococcus species NOT DETECTED NOT DETECTED Final   Streptococcus agalactiae NOT DETECTED NOT DETECTED Final   Streptococcus pneumoniae NOT DETECTED NOT DETECTED Final   Streptococcus pyogenes NOT DETECTED NOT DETECTED Final   Acinetobacter baumannii NOT DETECTED NOT DETECTED Final   Enterobacteriaceae species DETECTED (A) NOT DETECTED Final    Comment: CRITICAL RESULT CALLED TO, READ BACK BY AND VERIFIED WITH: MIKE MACCIA,PHARMD _1  07/05/15 MKELLY    Enterobacter cloacae complex NOT DETECTED NOT DETECTED Final   Escherichia coli DETECTED (A) NOT DETECTED Final      Comment: CRITICAL RESULT CALLED TO, READ BACK BY AND VERIFIED WITH: MIKE MACCIA,PHARMD _2  07/05/15 KELLYM    Klebsiella oxytoca NOT DETECTED NOT DETECTED Final   Klebsiella pneumoniae NOT DETECTED NOT DETECTED Final   Proteus species NOT DETECTED NOT DETECTED Final  Serratia marcescens NOT DETECTED NOT DETECTED Final   Carbapenem resistance NOT DETECTED NOT DETECTED Final   Haemophilus influenzae NOT DETECTED NOT DETECTED Final   Neisseria meningitidis NOT DETECTED NOT DETECTED Final   Pseudomonas aeruginosa NOT DETECTED NOT DETECTED Final   Candida albicans NOT DETECTED NOT DETECTED Final   Candida glabrata NOT DETECTED NOT DETECTED Final   Candida krusei NOT DETECTED NOT DETECTED Final   Candida parapsilosis NOT DETECTED NOT DETECTED Final   Candida tropicalis NOT DETECTED NOT DETECTED Final  Urine culture     Status: Abnormal   Collection Time: 07/04/15  2:54 PM  Result Value Ref Range Status   Specimen Description URINE, CLEAN CATCH  Final   Special Requests NONE  Final   Culture (A)  Final    >=100,000 COLONIES/mL KLEBSIELLA PNEUMONIAE >=100,000 COLONIES/mL ESCHERICHIA COLI    Report Status 07/06/2015 FINAL  Final   Organism ID, Bacteria KLEBSIELLA PNEUMONIAE (A)  Final   Organism ID, Bacteria ESCHERICHIA COLI (A)  Final      Susceptibility   Escherichia coli - MIC*    AMPICILLIN <=2 SENSITIVE Sensitive     CEFAZOLIN <=4 SENSITIVE Sensitive     CEFTRIAXONE <=1 SENSITIVE Sensitive     CIPROFLOXACIN <=0.25 SENSITIVE Sensitive     GENTAMICIN <=1 SENSITIVE Sensitive     IMIPENEM <=0.25 SENSITIVE Sensitive     NITROFURANTOIN <=16 SENSITIVE Sensitive     TRIMETH/SULFA <=20 SENSITIVE Sensitive     AMPICILLIN/SULBACTAM <=2 SENSITIVE Sensitive     PIP/TAZO <=4 SENSITIVE Sensitive     * >=100,000 COLONIES/mL ESCHERICHIA COLI   Klebsiella pneumoniae - MIC*    AMPICILLIN 16 RESISTANT Resistant     CEFAZOLIN <=4 SENSITIVE Sensitive     CEFTRIAXONE <=1 SENSITIVE Sensitive      CIPROFLOXACIN <=0.25 SENSITIVE Sensitive     GENTAMICIN <=1 SENSITIVE Sensitive     IMIPENEM <=0.25 SENSITIVE Sensitive     NITROFURANTOIN <=16 SENSITIVE Sensitive     TRIMETH/SULFA <=20 SENSITIVE Sensitive     AMPICILLIN/SULBACTAM 4 SENSITIVE Sensitive     PIP/TAZO 8 SENSITIVE Sensitive     * >=100,000 COLONIES/mL KLEBSIELLA PNEUMONIAE  Culture, blood (routine x 2)     Status: Abnormal   Collection Time: 07/04/15  2:56 PM  Result Value Ref Range Status   Specimen Description BLOOD LEFT HAND  Final   Special Requests BOTTLES DRAWN AEROBIC AND ANAEROBIC 5CC  Final   Culture  Setup Time   Final    GRAM NEGATIVE RODS IN BOTH AEROBIC AND ANAEROBIC BOTTLES CRITICAL RESULT CALLED TO, READ BACK BY AND VERIFIED WITH: M MACCIA,PHARMD AT 8099 07/05/15 BY M KELLY    Culture (A)  Final    ESCHERICHIA COLI SUSCEPTIBILITIES PERFORMED ON PREVIOUS CULTURE WITHIN THE LAST 5 DAYS.    Report Status 07/07/2015 FINAL  Final         Radiology Studies: Dg Chest Port 1 View  07/06/2015  CLINICAL DATA:  Cough and shortness of breath. EXAM: PORTABLE CHEST 1 VIEW COMPARISON:  07/05/2015 FINDINGS: At 2004 hours. Lungs are hyperexpanded. Underlying chronic interstitial lung disease noted with superimposition of interstitial in diffuse patchy alveolar opacity on today's study. Cardiopericardial silhouette appears enlarged. Bones are diffusely demineralized. Telemetry leads overlie the chest. IMPRESSION: Imaging features today likely related to pulmonary edema superimposed on advanced chronic underlying interstitial lung disease. Diffuse infection could also have this appearance. Electronically Signed   By: Misty Stanley M.D.   On: 07/06/2015 20:37  Scheduled Meds: . amLODipine  2.5 mg Oral q morning - 10a  . antiseptic oral rinse  7 mL Mouth Rinse BID  . aspirin EC  81 mg Oral Daily  . atorvastatin  40 mg Oral q1800  .  ceFAZolin (ANCEF) IV  2 g Intravenous Q12H  . famotidine  20 mg Oral Daily   . feeding supplement (ENSURE ENLIVE)  237 mL Oral BID BM  . hydrochlorothiazide  25 mg Oral q morning - 10a  . ipratropium-albuterol  3 mL Nebulization TID  . irbesartan  300 mg Oral q morning - 10a  . levothyroxine  88 mcg Oral QAC breakfast  . nebivolol  10 mg Oral Daily  . pantoprazole  40 mg Oral BID AC  . predniSONE  50 mg Oral Q breakfast  . ticagrelor  90 mg Oral BID   Continuous Infusions:    LOS: 2 days    Time spent: 25    Karmen Bongo, MD [facility]   If 7PM-7AM, please contact night-coverage [email or web address] Password [facility] 07/07/2015, 6:03 PM

## 2015-07-07 NOTE — Telephone Encounter (Signed)
Called spoke with pt. She is currently admitted to the hospital and just wanted to let MW know. She did not want to cancel her med calendar appt with TP on 7/3 yet. FYI for MW

## 2015-07-07 NOTE — Progress Notes (Signed)
Langston ReusingGave Kirby, NP pt's EKG. Will continue to monitor pt. Nelda MarseilleJenny Thacker, RN

## 2015-07-07 NOTE — Progress Notes (Signed)
Notified Craige CottaKirby, NP that pt requesting sleep medication. Will continue to monitor pt. Nelda MarseilleJenny Thacker, RN

## 2015-07-08 DIAGNOSIS — J9620 Acute and chronic respiratory failure, unspecified whether with hypoxia or hypercapnia: Secondary | ICD-10-CM | POA: Diagnosis present

## 2015-07-08 DIAGNOSIS — J9622 Acute and chronic respiratory failure with hypercapnia: Secondary | ICD-10-CM

## 2015-07-08 DIAGNOSIS — J9621 Acute and chronic respiratory failure with hypoxia: Secondary | ICD-10-CM

## 2015-07-08 LAB — PROCALCITONIN: PROCALCITONIN: 4.8 ng/mL

## 2015-07-08 LAB — CBC WITH DIFFERENTIAL/PLATELET
BASOS ABS: 0 10*3/uL (ref 0.0–0.1)
Basophils Relative: 0 %
EOS PCT: 0 %
Eosinophils Absolute: 0 10*3/uL (ref 0.0–0.7)
HCT: 29.7 % — ABNORMAL LOW (ref 36.0–46.0)
HEMOGLOBIN: 9.5 g/dL — AB (ref 12.0–15.0)
Lymphocytes Relative: 9 %
Lymphs Abs: 1 10*3/uL (ref 0.7–4.0)
MCH: 29.6 pg (ref 26.0–34.0)
MCHC: 32 g/dL (ref 30.0–36.0)
MCV: 92.5 fL (ref 78.0–100.0)
Monocytes Absolute: 0.3 10*3/uL (ref 0.1–1.0)
Monocytes Relative: 3 %
NEUTROS PCT: 88 %
Neutro Abs: 10 10*3/uL — ABNORMAL HIGH (ref 1.7–7.7)
PLATELETS: 121 10*3/uL — AB (ref 150–400)
RBC: 3.21 MIL/uL — AB (ref 3.87–5.11)
RDW: 14.6 % (ref 11.5–15.5)
WBC: 11.3 10*3/uL — AB (ref 4.0–10.5)

## 2015-07-08 LAB — BASIC METABOLIC PANEL
ANION GAP: 8 (ref 5–15)
BUN: 38 mg/dL — ABNORMAL HIGH (ref 6–20)
CHLORIDE: 99 mmol/L — AB (ref 101–111)
CO2: 32 mmol/L (ref 22–32)
Calcium: 8.6 mg/dL — ABNORMAL LOW (ref 8.9–10.3)
Creatinine, Ser: 1.51 mg/dL — ABNORMAL HIGH (ref 0.44–1.00)
GFR calc Af Amer: 35 mL/min — ABNORMAL LOW (ref >60)
GFR, EST NON AFRICAN AMERICAN: 30 mL/min — AB (ref >60)
GLUCOSE: 176 mg/dL — AB (ref 65–99)
POTASSIUM: 3.2 mmol/L — AB (ref 3.5–5.1)
SODIUM: 139 mmol/L (ref 135–145)

## 2015-07-08 LAB — TROPONIN I: TROPONIN I: 0.14 ng/mL — AB (ref <0.03)

## 2015-07-08 MED ORDER — POTASSIUM CHLORIDE CRYS ER 20 MEQ PO TBCR
40.0000 meq | EXTENDED_RELEASE_TABLET | Freq: Once | ORAL | Status: AC
  Administered 2015-07-08: 40 meq via ORAL
  Filled 2015-07-08: qty 2

## 2015-07-08 MED ORDER — SODIUM CHLORIDE 0.9 % IV SOLN
INTRAVENOUS | Status: AC
  Administered 2015-07-08: 17:00:00 via INTRAVENOUS

## 2015-07-08 MED ORDER — BOOST / RESOURCE BREEZE PO LIQD
1.0000 | Freq: Three times a day (TID) | ORAL | Status: DC
Start: 2015-07-08 — End: 2015-07-09
  Administered 2015-07-08 – 2015-07-09 (×3): 1 via ORAL

## 2015-07-08 NOTE — Progress Notes (Signed)
Nutrition Follow-up  DOCUMENTATION CODES:   Severe malnutrition in context of chronic illness  INTERVENTION:   -D/c Ensure Enlive po TID, due to poor acceptance -Boost Breeze po TID, each supplement provides 250 kcal and 9 grams of protein  NUTRITION DIAGNOSIS:   Malnutrition related to chronic illness as evidenced by energy intake < or equal to 75% for > or equal to 1 month, severe depletion of muscle mass, severe depletion of body fat.  Ongoing  GOAL:   Patient will meet greater than or equal to 90% of their needs  Progressing  MONITOR:   PO intake, Supplement acceptance, Labs, Weight trends, Skin, I & O's  REASON FOR ASSESSMENT:   Malnutrition Screening Tool    ASSESSMENT:   [name] is a 82 y.o. female with medical history significant for COPD asthma/history of frequent pneumonias/pulmonary fibrosis, on home O2 2 Liters, Hypothyroidism, CAD, non ETOH cirrhosis, GERD, presenting with 2 week history of Shortness of breath, and low grade fever.  Meal completion 50-75%. Per RN, pt has been refusing Ensure supplements, as she does not like them. RD will adjust supplement regimen to improve nutritional intake.   Labs reviewed: K: 3.2.   Diet Order:  Diet regular Room service appropriate?: Yes; Fluid consistency:: Thin  Skin:  Reviewed, no issues  Last BM:  07/05/15  Height:   Ht Readings from Last 1 Encounters:  06/24/15 5\' 3"  (1.6 m)    Weight:   Wt Readings from Last 1 Encounters:  07/05/15 119 lb 3.2 oz (54.069 kg)    Ideal Body Weight:  52.3 kg  BMI:  Body mass index is 21.12 kg/(m^2).  Estimated Nutritional Needs:   Kcal:  1400-1600  Protein:  50-65 grams  Fluid:  1.4-1.6 L  EDUCATION NEEDS:   Education needs addressed  [name], RD, LDN, CDE Pager: [phone] After hours Pager: [phone]

## 2015-07-08 NOTE — Progress Notes (Signed)
Physical Therapy Treatment Patient Details Name: [name] MRN: [id number] DOB: 1927/08/03 Today's Date: 07/08/2015    History of Present Illness 82 y.o. female with medical history significant for COPD asthma/history of frequent pneumonias/pulmonary fibrosis, on home O2 2 Liters, Hypothyroidism, CAD, non ETOH cirrhosis, GERD, presenting with main concern of 1-2 weeks duration of progressively worsening dyspnea that initially started with exertion and has progressed to dyspnea at rest in the past 48 hours; she was admitted for acute respiratory failure secondary to HCAP, E. Coli bacteremia and UTI    PT Comments    Patient SaO2 85-92% on 3L on arrival (pt supine, HOB 20). Patient much weaker, more unsteady with knees buckling, and SaO2 drops with short distance ambulation (85%). Family present and discussed discharge options and pt reluctantly agreed she is not safe to return home and could benefit from short-term SNF for rehab.   Follow Up Recommendations  SNF;  Supervision for mobility/OOB    Equipment Recommendations  None recommended by PT    Recommendations for Other Services OT consult     Precautions / Restrictions Precautions Precautions: Fall;Other (comment) Precaution Comments: watch saO2    Mobility  Bed Mobility Overal bed mobility: Needs Assistance Bed Mobility: Supine to Sit     Supine to sit: Supervision     General bed mobility comments: Supervision forlines and SaO2 monitoring  Transfers Overall transfer level: Needs assistance Equipment used: None;Rolling walker (2 wheeled) (grab bar at toilet) Transfers: Sit to/from Stand Sit to Stand: Min guard         General transfer comment: Incr difficulty with low toilet  Ambulation/Gait Ambulation/Gait assistance: Min assist Ambulation Distance (Feet): 10 Feet (toilet; 15) Assistive device: Rolling walker (2 wheeled) Gait Pattern/deviations: Step-through pattern;Step-to pattern;Decreased stride  length   Gait velocity interpretation: Below normal speed for age/gender General Gait Details: knees buckling, very wobbly/weak; poor use of UEs on RW for support   Stairs            Wheelchair Mobility    Modified Rankin (Stroke Patients Only)       Balance Overall balance assessment: Needs assistance Sitting-balance support: No upper extremity supported;Feet supported Sitting balance-Leahy Scale: Fair     Standing balance support: Single extremity supported Standing balance-Leahy Scale: Poor                      Cognition Arousal/Alertness: Awake/alert Behavior During Therapy: WFL for tasks assessed/performed Overall Cognitive Status: Within Functional Limits for tasks assessed                      Exercises      General Comments General comments (skin integrity, edema, etc.): Family members present and very concerned re: pt returning home alone      Pertinent Vitals/Pain Pain Assessment: No/denies pain    Home Living                      Prior Function            PT Goals (current goals can now be found in the care plan section) Acute Rehab PT Goals Patient Stated Goal: get better Time For Goal Achievement: 07/20/15 Progress towards PT goals: Not progressing toward goals - comment    Frequency  Min 3X/week    PT Plan Discharge plan needs to be updated    Co-evaluation             End of Session  Equipment Utilized During Treatment: Oxygen;Gait belt Activity Tolerance: Patient limited by fatigue;Treatment limited secondary to medical complications (Comment) (decr SaO2) Patient left: with call bell/phone within reach;in chair;with nursing/sitter in room;with family/visitor present     Time: 1614-1700 PT Time Calculation (min) (ACUTE ONLY): 46 min  Charges:  $Gait Training: 8-22 mins $Therapeutic Activity: 23-37 mins                    G Codes:      [name] 07/08/2015, 5:35 PM Pager [phone]

## 2015-07-08 NOTE — Care Management Important Message (Signed)
Important Message  Patient Details  Name: Bea GraffDorothy S Ripp MRN: [id number] Date of Birth: April 07, 1927   Medicare Important Message Given:  Yes    Kyla BalzarineShealy, Noora Locascio [place]ena 07/08/2015, 12:16 PM

## 2015-07-08 NOTE — Progress Notes (Signed)
Pt up and walking in room with PT and bottom observed. No pressure ulcer noted but pt requested foam dressing to bony prominence.

## 2015-07-08 NOTE — Progress Notes (Addendum)
PROGRESS NOTE   [name] EHU:[id number] DOB: July 26, 1927 DOA: 07/04/2015 PCP: Lujean Amel, MD Outpatient Specialists:    Brief Narrative: 82 y.o. female with medical history significant for COPD asthma/history of frequent pneumonias/pulmonary fibrosis, on home O2 2 Liters, Hypothyroidism, CAD, non ETOH cirrhosis, GERD, presenting with main concern of 1-2 weeks duration of progressively worsening dyspnea that initially started with exertion and has progressed to dyspnea at rest in the past 48 hours, PTA. This was associated with fevers, cough productive of yellow sputum with streaks of blood in it.   Please note that pt should meet criteria for inpatient stay, she was admitted for acute respiratory failure secondary to HCAP, E. Coli bacteremia and UTI. Needs IV ABX, IV solumedrol. She will require at least 3-4 days in the hospital before medications can be changed to PO.   Assessment & Plan:  Principal Problem:  HCAP (healthcare-associated pneumonia) Active Problems:  Hypothyroidism  Pulmonary fibrosis (HCC)  OA (osteoarthritis)  Asthma  Non-alcoholic cirrhosis (HCC)  Fever  Chronic respiratory failure with hypoxia (HCC)  GERD (gastroesophageal reflux disease)  Cirrhosis (HCC)  History of non-ST elevation myocardial infarction (NSTEMI)  HLD (hyperlipidemia)  Acute cystitis with hematuria  Sepsis ([place])   Acuteon chronic respiratory failure with hypoxia likely related to HCAP, unknown pathogen - imposed on known COPD and pulmonary fibrosis steroid dependent on 10 mg PO QD - recent admission for NSTEMI in 05/2015 - Patient on home O2 2 liters, currently 88-92% on 4 L [place] (changed parameters, as 88-92% is reasonable for her) - She was started on Solumedrol 40 mg BID IV 6/27 and changed to Prednisone; would start with 50 mg tab daily and taper down by 10 mg daily until completed  - d/w pharmacy choice of abx, will change from Levaquin to  Ancef  - follow up on sputum cultures - provide nebulizers scheduled and as needed - encouraged use of flutter valve while awake   Sepsis due to HCAP, bacteremia E. Coli, UTI with E. Coli and Klebsiella  - please note that pt has met criteria for sepsis with T > 100.5 F, WBC 16 K, lactic acid 2.68, RR > 22 bpm, elevated procalcitonin level > 5 - E. Coli bacteremia, E. Coli and Klebsiella UTI - changed Levaquin to Cefazolin  - follow up on final blood and urine cultures, sputum cultures - no further fevers, WBC count 11.3 on 6/30, does not need further trending unless clinical status changes - lactic acid has fortunately cleared  -Will repeat blood and urine cultures prior to discharge to ensure clearance  Acute exacerbation of COPD - significant improvement in respiratory status  - place back on Prednisone and taper down as indicated above  - continue BD's scheduled and as needed   CAD - EKG without ACS, last cardiac catheterization with stent in 05/2015 - Continue ASA, Brillinta high dose statin, beta blockers - no chest pain this AM    Hypokalemia - mild, supplement and repeat BMP In AM  Hyperlipidemia - Continue home statins  Hypothyroidism - Continue home Synthroid  Severe malnutrition in context of chronic illness Per nutrition: -D/c Ensure Enlive po TID, due to poor acceptance -Boost Breeze po TID, each supplement provides 250 kcal and 9 grams of protein  GERD - with history of GIB in the past , no acute symptoms: - on PPI   Thrombocytopenia, anemia of chronic disease  - Likely due to non alcoholic cirrhosis - no indication for transfusion - Currently plt 121, Hgb 9.5 -  both lower today - Will recheck in AM  Acute kidney injury imposed on Chronic kidney disease stage 3  - secondary to sepsis etiology  - with baseline Cr ~ 1.2 - up to 1.51, will give gentle IVF bolus of 500 cc over 5 hours - monitor closely  - BMP In AM   DVT prophylaxis:  SCD Code Status: Full  Family Communication: one Disposition Plan: Physical therapy now recommends SNF for rehab for patient  Consultants:   None  Procedures:   None  Antimicrobials:   Vanc and Zosyn x 1 6/26  6/26 Levaquin --> 6/27  6/28 Ancef (Cefazolin) -->  Subjective: Patient not feeling as good today and less eager to go home.  Fatigues easily.  Objective: Filed Vitals:   07/07/15 2219 07/08/15 0622 07/08/15 1140 07/08/15 1438  BP: 155/57 135/76  128/80  Pulse: 105 89  49  Temp: 98 F (36.7 C) 98 F (36.7 C)  97.7 F (36.5 C)  TempSrc:    Oral  Resp: _0 Weight:      SpO2: 91% 93% 92% 96%    Intake/Output Summary (Last 24 hours) at 07/08/15 1511 Last data filed at 07/07/15 2254  Gross per 24 hour  Intake    500 ml  Output    800 ml  Net   -300 ml   Filed Weights   07/05/15 1611  Weight: 54.069 kg (119 lb 3.2 oz)    Examination:  General exam: Appears calm and comfortable  Respiratory system: Respiratory effort slightly increased.  Scattered wheezes. Cardiovascular system: S1 & S2 heard, RRR. No JVD, murmurs, rubs, gallops or clicks. No pedal edema. Gastrointestinal system: Abdomen is nondistended, soft and nontender. No organomegaly or masses felt. Normal bowel sounds heard. Central nervous system: Alert and oriented. No focal neurological deficits. Extremities: Symmetric 5 x 5 power. Skin: No rashes, lesions or ulcers Psychiatry: Judgement and insight appear normal. Mood & affect appropriate.     Data Reviewed: I have personally reviewed following labs and imaging studies  CBC:  Recent Labs Lab 07/04/15 1248 07/05/15 0511 07/06/15 0438 07/06/15 2201 07/07/15 1002 07/08/15 0549  WBC 16.1* 18.6* 11.1* 27.3* 16.1* 11.3*  NEUTROABS 14.1*  --   --   --  14.7* 10.0*  HGB 10.4* 8.9* 8.7* 11.0* 10.8* 9.5*  HCT 32.6* 28.0* 27.4* 34.8* 33.9* 29.7*  MCV 93.1 93.3 90.7 93.5 91.9 92.5  PLT 122* 93* 97* 211 151 121*   Basic  Metabolic Panel:  Recent Labs Lab 07/05/15 0511 07/06/15 0438 07/06/15 2201 07/07/15 1002 07/08/15 0549  NA 139 138 141 140 139  K 3.6 3.3* 4.8 3.4* 3.2*  CL 108 107 104 101 99*  CO2 _1 32  GLUCOSE 116* 236* 187* 223* 176*  BUN 36* 38* 35* 37* 38*  CREATININE 1.52* 1.45* 1.38* 1.43* 1.51*  CALCIUM 8.0* 8.0* 9.4 9.0 8.6*   GFR: Estimated Creatinine Clearance: 22.1 mL/min (by C-G formula based on Cr of 1.51). Liver Function Tests:  Recent Labs Lab 07/04/15 1248 07/05/15 0511  AST 38 28  ALT 49 31  ALKPHOS 83 69  BILITOT 1.0 1.1  PROT 5.6* 4.5*  ALBUMIN 2.8* 2.1*   No results for input(s): LIPASE, AMYLASE in the last 168 hours. No results for input(s): AMMONIA in the last 168 hours. Coagulation Profile: No results for input(s): INR, PROTIME in the last 168 hours. Cardiac Enzymes:  Recent Labs Lab 07/06/15 2201 07/07/15 0258 07/07/15 1002  TROPONINI  0.14* 0.10* 0.09*   BNP (last 3 results) No results for input(s): PROBNP in the last 8760 hours. HbA1C: No results for input(s): HGBA1C in the last 72 hours. CBG: No results for input(s): GLUCAP in the last 168 hours. Lipid Profile: No results for input(s): CHOL, HDL, LDLCALC, TRIG, CHOLHDL, LDLDIRECT in the last 72 hours. Thyroid Function Tests: No results for input(s): TSH, T4TOTAL, FREET4, T3FREE, THYROIDAB in the last 72 hours. Anemia Panel: No results for input(s): VITAMINB12, FOLATE, FERRITIN, TIBC, IRON, RETICCTPCT in the last 72 hours. Urine analysis:    Component Value Date/Time   COLORURINE YELLOW 07/04/2015 1454   APPEARANCEUR CLOUDY* 07/04/2015 1454   LABSPEC 1.022 07/04/2015 1454   PHURINE 5.5 07/04/2015 1454   GLUCOSEU NEGATIVE 07/04/2015 1454   HGBUR SMALL* 07/04/2015 1454   [place] 07/04/2015 1454   KETONESUR NEGATIVE 07/04/2015 1454   PROTEINUR 30* 07/04/2015 1454   UROBILINOGEN 1.0 09/28/2014 1455   NITRITE NEGATIVE 07/04/2015 1454   LEUKOCYTESUR MODERATE*  07/04/2015 1454   Sepsis Labs: _0 (procalcitonin:4,lacticidven:4)  ) Recent Results (from the past 240 hour(s))  Culture, blood (routine x 2)     Status: Abnormal   Collection Time: 07/04/15  2:53 PM  Result Value Ref Range Status   Specimen Description BLOOD RIGHT ANTECUBITAL  Final   Special Requests BOTTLES DRAWN AEROBIC AND ANAEROBIC 5CC  Final   Culture  Setup Time   Final    GRAM NEGATIVE RODS IN BOTH AEROBIC AND ANAEROBIC BOTTLES CRITICAL RESULT CALLED TO, READ BACK BY AND VERIFIED WITH: Ailene Rud AT 4401 07/05/15 BY M KELLY    Culture ESCHERICHIA COLI (A)  Final   Report Status 07/07/2015 FINAL  Final   Organism ID, Bacteria ESCHERICHIA COLI  Final      Susceptibility   Escherichia coli - MIC*    AMPICILLIN <=2 SENSITIVE Sensitive     CEFAZOLIN <=4 SENSITIVE Sensitive     CEFEPIME <=1 SENSITIVE Sensitive     CEFTAZIDIME <=1 SENSITIVE Sensitive     CEFTRIAXONE <=1 SENSITIVE Sensitive     CIPROFLOXACIN <=0.25 SENSITIVE Sensitive     GENTAMICIN <=1 SENSITIVE Sensitive     IMIPENEM <=0.25 SENSITIVE Sensitive     TRIMETH/SULFA <=20 SENSITIVE Sensitive     AMPICILLIN/SULBACTAM <=2 SENSITIVE Sensitive     PIP/TAZO <=4 SENSITIVE Sensitive     * ESCHERICHIA COLI  Blood Culture ID Panel (Reflexed)     Status: Abnormal   Collection Time: 07/04/15  2:53 PM  Result Value Ref Range Status   Enterococcus species NOT DETECTED NOT DETECTED Final   Vancomycin resistance NOT DETECTED NOT DETECTED Final   Listeria monocytogenes NOT DETECTED NOT DETECTED Final   Staphylococcus species NOT DETECTED NOT DETECTED Final   Staphylococcus aureus NOT DETECTED NOT DETECTED Final   Methicillin resistance NOT DETECTED NOT DETECTED Final   Streptococcus species NOT DETECTED NOT DETECTED Final   Streptococcus agalactiae NOT DETECTED NOT DETECTED Final   Streptococcus pneumoniae NOT DETECTED NOT DETECTED Final   Streptococcus pyogenes NOT DETECTED NOT DETECTED Final   Acinetobacter  baumannii NOT DETECTED NOT DETECTED Final   Enterobacteriaceae species DETECTED (A) NOT DETECTED Final    Comment: CRITICAL RESULT CALLED TO, READ BACK BY AND VERIFIED WITH: MIKE MACCIA,PHARMD _1  07/05/15 MKELLY    Enterobacter cloacae complex NOT DETECTED NOT DETECTED Final   Escherichia coli DETECTED (A) NOT DETECTED Final    Comment: CRITICAL RESULT CALLED TO, READ BACK BY AND VERIFIED WITH: MIKE MACCIA,PHARMD _2  07/05/15 KELLYM  Klebsiella oxytoca NOT DETECTED NOT DETECTED Final   Klebsiella pneumoniae NOT DETECTED NOT DETECTED Final   Proteus species NOT DETECTED NOT DETECTED Final   Serratia marcescens NOT DETECTED NOT DETECTED Final   Carbapenem resistance NOT DETECTED NOT DETECTED Final   Haemophilus influenzae NOT DETECTED NOT DETECTED Final   Neisseria meningitidis NOT DETECTED NOT DETECTED Final   Pseudomonas aeruginosa NOT DETECTED NOT DETECTED Final   Candida albicans NOT DETECTED NOT DETECTED Final   Candida glabrata NOT DETECTED NOT DETECTED Final   Candida krusei NOT DETECTED NOT DETECTED Final   Candida parapsilosis NOT DETECTED NOT DETECTED Final   Candida tropicalis NOT DETECTED NOT DETECTED Final  Urine culture     Status: Abnormal   Collection Time: 07/04/15  2:54 PM  Result Value Ref Range Status   Specimen Description URINE, CLEAN CATCH  Final   Special Requests NONE  Final   Culture (A)  Final    >=100,000 COLONIES/mL KLEBSIELLA PNEUMONIAE >=100,000 COLONIES/mL ESCHERICHIA COLI    Report Status 07/06/2015 FINAL  Final   Organism ID, Bacteria KLEBSIELLA PNEUMONIAE (A)  Final   Organism ID, Bacteria ESCHERICHIA COLI (A)  Final      Susceptibility   Escherichia coli - MIC*    AMPICILLIN <=2 SENSITIVE Sensitive     CEFAZOLIN <=4 SENSITIVE Sensitive     CEFTRIAXONE <=1 SENSITIVE Sensitive     CIPROFLOXACIN <=0.25 SENSITIVE Sensitive     GENTAMICIN <=1 SENSITIVE Sensitive     IMIPENEM <=0.25 SENSITIVE Sensitive     NITROFURANTOIN <=16 SENSITIVE  Sensitive     TRIMETH/SULFA <=20 SENSITIVE Sensitive     AMPICILLIN/SULBACTAM <=2 SENSITIVE Sensitive     PIP/TAZO <=4 SENSITIVE Sensitive     * >=100,000 COLONIES/mL ESCHERICHIA COLI   Klebsiella pneumoniae - MIC*    AMPICILLIN 16 RESISTANT Resistant     CEFAZOLIN <=4 SENSITIVE Sensitive     CEFTRIAXONE <=1 SENSITIVE Sensitive     CIPROFLOXACIN <=0.25 SENSITIVE Sensitive     GENTAMICIN <=1 SENSITIVE Sensitive     IMIPENEM <=0.25 SENSITIVE Sensitive     NITROFURANTOIN <=16 SENSITIVE Sensitive     TRIMETH/SULFA <=20 SENSITIVE Sensitive     AMPICILLIN/SULBACTAM 4 SENSITIVE Sensitive     PIP/TAZO 8 SENSITIVE Sensitive     * >=100,000 COLONIES/mL KLEBSIELLA PNEUMONIAE  Culture, blood (routine x 2)     Status: Abnormal   Collection Time: 07/04/15  2:56 PM  Result Value Ref Range Status   Specimen Description BLOOD LEFT HAND  Final   Special Requests BOTTLES DRAWN AEROBIC AND ANAEROBIC 5CC  Final   Culture  Setup Time   Final    GRAM NEGATIVE RODS IN BOTH AEROBIC AND ANAEROBIC BOTTLES CRITICAL RESULT CALLED TO, READ BACK BY AND VERIFIED WITH: M MACCIA,PHARMD AT 5638 07/05/15 BY M KELLY    Culture (A)  Final    ESCHERICHIA COLI SUSCEPTIBILITIES PERFORMED ON PREVIOUS CULTURE WITHIN THE LAST 5 DAYS.    Report Status 07/07/2015 FINAL  Final         Radiology Studies: Dg Chest Port 1 View  07/06/2015  CLINICAL DATA:  Cough and shortness of breath. EXAM: PORTABLE CHEST 1 VIEW COMPARISON:  07/05/2015 FINDINGS: At 2004 hours. Lungs are hyperexpanded. Underlying chronic interstitial lung disease noted with superimposition of interstitial in diffuse patchy alveolar opacity on today's study. Cardiopericardial silhouette appears enlarged. Bones are diffusely demineralized. Telemetry leads overlie the chest. IMPRESSION: Imaging features today likely related to pulmonary edema superimposed on advanced chronic underlying interstitial lung  disease. Diffuse infection could also have this appearance.  Electronically Signed   By: Misty Stanley M.D.   On: 07/06/2015 20:37        Scheduled Meds: . amLODipine  2.5 mg Oral q morning - 10a  . antiseptic oral rinse  7 mL Mouth Rinse BID  . aspirin EC  81 mg Oral Daily  . atorvastatin  40 mg Oral q1800  .  ceFAZolin (ANCEF) IV  2 g Intravenous Q12H  . famotidine  20 mg Oral Daily  . feeding supplement  1 Container Oral TID BM  . hydrochlorothiazide  25 mg Oral q morning - 10a  . ipratropium-albuterol  3 mL Nebulization TID  . irbesartan  300 mg Oral q morning - 10a  . levothyroxine  88 mcg Oral QAC breakfast  . nebivolol  10 mg Oral Daily  . pantoprazole  40 mg Oral BID AC  . predniSONE  50 mg Oral Q breakfast  . ticagrelor  90 mg Oral BID   Continuous Infusions:    LOS: 3 days    Time spent: 35 minutes    Karmen Bongo, MD [facility]  If 7PM-7AM, please contact night-coverage [email or web address] Password [email or web address] 07/08/2015, 3:11 PM

## 2015-07-09 DIAGNOSIS — E43 Unspecified severe protein-calorie malnutrition: Secondary | ICD-10-CM | POA: Clinically undetermined

## 2015-07-09 DIAGNOSIS — N179 Acute kidney failure, unspecified: Secondary | ICD-10-CM | POA: Diagnosis present

## 2015-07-09 DIAGNOSIS — D638 Anemia in other chronic diseases classified elsewhere: Secondary | ICD-10-CM | POA: Diagnosis present

## 2015-07-09 DIAGNOSIS — D696 Thrombocytopenia, unspecified: Secondary | ICD-10-CM | POA: Diagnosis present

## 2015-07-09 DIAGNOSIS — N189 Chronic kidney disease, unspecified: Secondary | ICD-10-CM

## 2015-07-09 LAB — CBC WITH DIFFERENTIAL/PLATELET
BASOS PCT: 0 %
Basophils Absolute: 0 10*3/uL (ref 0.0–0.1)
Eosinophils Absolute: 0 10*3/uL (ref 0.0–0.7)
Eosinophils Relative: 0 %
HEMATOCRIT: 34.3 % — AB (ref 36.0–46.0)
HEMOGLOBIN: 10.6 g/dL — AB (ref 12.0–15.0)
LYMPHS ABS: 1 10*3/uL (ref 0.7–4.0)
Lymphocytes Relative: 6 %
MCH: 29.3 pg (ref 26.0–34.0)
MCHC: 30.9 g/dL (ref 30.0–36.0)
MCV: 94.8 fL (ref 78.0–100.0)
MONOS PCT: 5 %
Monocytes Absolute: 0.8 10*3/uL (ref 0.1–1.0)
NEUTROS ABS: 14.9 10*3/uL — AB (ref 1.7–7.7)
NEUTROS PCT: 89 %
Platelets: 198 10*3/uL (ref 150–400)
RBC: 3.62 MIL/uL — ABNORMAL LOW (ref 3.87–5.11)
RDW: 14.7 % (ref 11.5–15.5)
WBC: 16.7 10*3/uL — ABNORMAL HIGH (ref 4.0–10.5)

## 2015-07-09 LAB — BASIC METABOLIC PANEL
Anion gap: 12 (ref 5–15)
BUN: 36 mg/dL — ABNORMAL HIGH (ref 6–20)
CHLORIDE: 102 mmol/L (ref 101–111)
CO2: 29 mmol/L (ref 22–32)
CREATININE: 1.34 mg/dL — AB (ref 0.44–1.00)
Calcium: 8.9 mg/dL (ref 8.9–10.3)
GFR calc non Af Amer: 35 mL/min — ABNORMAL LOW (ref >60)
GFR, EST AFRICAN AMERICAN: 40 mL/min — AB (ref >60)
Glucose, Bld: 156 mg/dL — ABNORMAL HIGH (ref 65–99)
POTASSIUM: 3.5 mmol/L (ref 3.5–5.1)
Sodium: 143 mmol/L (ref 135–145)

## 2015-07-09 MED ORDER — CEFAZOLIN SODIUM-DEXTROSE 2-4 GM/100ML-% IV SOLN: 2.0000 g | Freq: Two times a day (BID) | INTRAVENOUS | Status: AC

## 2015-07-09 MED ORDER — PREDNISONE 10 MG (21) PO TBPK: 10.0000 mg | ORAL_TABLET | Freq: Every day | ORAL | Status: AC

## 2015-07-09 MED ORDER — TRAMADOL HCL 50 MG PO TABS: ORAL_TABLET | ORAL | Status: AC

## 2015-07-09 MED ORDER — BOOST / RESOURCE BREEZE PO LIQD: 1.0000 | Freq: Three times a day (TID) | ORAL | Status: DC

## 2015-07-09 MED ORDER — IPRATROPIUM-ALBUTEROL 0.5-2.5 (3) MG/3ML IN SOLN: 3.0000 mL | Freq: Three times a day (TID) | RESPIRATORY_TRACT | Status: DC

## 2015-07-09 MED ORDER — PREDNISONE 10 MG PO TABS: 10.0000 mg | ORAL_TABLET | Freq: Every day | ORAL | Status: AC

## 2015-07-09 NOTE — Progress Notes (Signed)
Pharmacy Antibiotic Note  [name] is a 82 y.o. female admitted on 07/04/2015 with bacteremia.  Pharmacy has been consulted for cefazolin dosing.  Pt with E coli bacteremia and UTI.  Plan: - Cefazolin 2gm IV q12h through 7/7 per d/c summary filed today  Weight: 119 lb 3.2 oz (54.069 kg) (per nurse tech)  Temp (24hrs), Avg:98.6 F (37 C), Min:98.4 F (36.9 C), Max:98.8 F (37.1 C)   Recent Labs Lab 07/04/15 1316 07/04/15 1506  07/06/15 0438 07/06/15 2201 07/07/15 1002 07/08/15 0549 07/09/15 0442  WBC  --   --   < > 11.1* 27.3* 16.1* 11.3* 16.7*  CREATININE  --   --   < > 1.45* 1.38* 1.43* 1.51* 1.34*  LATICACIDVEN 2.68* 1.89  --   --   --   --   --   --   < > = values in this interval not displayed.  Estimated Creatinine Clearance: 24.9 mL/min (by C-G formula based on Cr of 1.34).    Not on File  Antimicrobials this admission: Vanc 6/26 x1 Cefepime 6/26 x1  Levaquin 6/26 >> 6/28 Cefazolin 6/28 >> (7/7)  Microbiology results: 6/26 urine: >100K EColi- pan sens, >100K kleb pneumo- R to amp 6/26 BCx: EColi- pan sens 6/26 BCID: EColi 7/1 BCx: sent  Thank you for allowing pharmacy to be a part of this patient's care.  Adel Burch D. Kaoir Loree, PharmD, BCPS Clinical Pharmacist Pager: [phone] 07/09/2015 2:50 PM

## 2015-07-09 NOTE — Progress Notes (Signed)
Pt prepared for d/c to SNF. IV d/c'd. Skin intact except as charted in most recent assessments. Vitals are stable. Attempted to call report called to receiving facility x 2. Transferred and placed on hold 10 min each time. Report given to EMS transport staff. Pt to be transported by ambulance service.

## 2015-07-09 NOTE — NC FL2 (Signed)
[place] MEDICAID FL2 LEVEL OF CARE SCREENING TOOL     IDENTIFICATION  Patient Name: [name] Birthdate: 1927/08/13 Sex: female Admission Date (Current Location): 07/04/2015  [facility] [place]   Facility and Address:  [facility], [address], [place], [place] [address]      Provider Number: [id number]  Attending Physician Name and Address:  Jonah BlueJennifer Yates, MD  Relative Name and Phone Number:       Current Level of Care: Hospital Recommended Level of Care: Skilled Nursing Facility Prior Approval Number:    Date Approved/Denied:   PASRR Number:    Discharge Plan: SNF    Current Diagnoses: Patient Active Problem List   Diagnosis Date Noted  . Thrombocytopenia (HCC) 07/09/2015  . Anemia of chronic disease 07/09/2015  . Acute kidney injury superimposed on chronic kidney disease (HCC) 07/09/2015  . Severe protein-calorie malnutrition (HCC) 07/09/2015  . Acute on chronic respiratory failure (HCC) 07/08/2015  . Sepsis (HCC) 07/05/2015  . HCAP (healthcare-associated pneumonia) 07/04/2015  . Acute cystitis with hematuria   . CAD (coronary artery disease) 06/24/2015  . Ischemic cardiomyopathy 06/24/2015  . Mitral regurgitation 06/24/2015  . HLD (hyperlipidemia) 06/24/2015  . History of non-ST elevation myocardial infarction (NSTEMI)   . NSTEMI (non-ST elevated myocardial infarction) (HCC) 06/08/2015  . Cough 02/01/2015  . Pneumonia 09/28/2014  . Hemoptysis 08/20/2014  . Acute post-hemorrhagic anemia 08/18/2013  . GIB (gastrointestinal bleeding) 08/18/2013  . Protein-calorie malnutrition, severe (HCC) 08/18/2013  . Diverticulosis of large intestine with hemorrhage 08/17/2013  . Abnormal chest x-ray 08/17/2013  . Protein calorie malnutrition (HCC) 08/17/2013  . Diverticulosis of colon with hemorrhage 08/17/2013  . Chronic rhinitis 06/03/2013  . Palpitations 04/14/2013  . Chronic respiratory failure with hypoxia  (HCC) 08/23/2012  . GERD (gastroesophageal reflux disease) 08/23/2012  . Osteoarthritis of knee 07/04/2011  . Postoperative anemia due to acute blood loss 07/04/2011  . HTN (hypertension) 07/03/2011  . Hypothyroidism 07/03/2011  . Pulmonary fibrosis (HCC) 07/03/2011  . OA (osteoarthritis) 07/03/2011  . Non-alcoholic cirrhosis (HCC) 07/03/2011    Orientation RESPIRATION BLADDER Height & Weight     Self, Time, Situation, Place  O2 (3L) Continent Weight: 54.069 kg (119 lb 3.2 oz) (per nurse tech) Height:     BEHAVIORAL SYMPTOMS/MOOD NEUROLOGICAL BOWEL NUTRITION STATUS   (NONE)  (NONE) Continent Diet (Regular)  AMBULATORY STATUS COMMUNICATION OF NEEDS Skin   Limited Assist Verbally Normal                       Personal Care Assistance Level of Assistance  Bathing, Feeding, Dressing Bathing Assistance: Limited assistance Feeding assistance: Independent Dressing Assistance: Limited assistance     Functional Limitations Info  Sight, Hearing, Speech Sight Info: Adequate Hearing Info: Adequate Speech Info: Adequate    SPECIAL CARE FACTORS FREQUENCY  PT (By licensed PT), OT (By licensed OT)     PT Frequency: 5/week OT Frequency: 5/week            Contractures Contractures Info: Not present    Additional Factors Info  Code Status, Allergies Code Status Info: Full Code Allergies Info: NKDA           Current Medications (07/09/2015):  This is the current hospital active medication list Current Facility-Administered Medications  Medication Dose Route Frequency Provider Last Rate Last Dose  . amLODipine (NORVASC) tablet 2.5 mg  2.5 mg Oral q morning - 10a Marcos EkeSara E Wertman, PA-C   2.5  mg at 07/09/15 0948  . antiseptic oral rinse (CPC / CETYLPYRIDINIUM CHLORIDE 0.05%) solution 7 mL  7 mL Mouth Rinse BID Dorothea OgleIskra M Myers, MD   7 mL at 07/09/15 1000  . aspirin EC tablet 81 mg  81 mg Oral Daily Marcos EkeSara E Wertman, PA-C   81 mg at 07/09/15 [id number]  . atorvastatin (LIPITOR) tablet 40  mg  40 mg Oral q1800 Marcos EkeSara E Wertman, PA-C   40 mg at 07/08/15 1643  . ceFAZolin (ANCEF) IVPB 2g/100 mL premix  2 g Intravenous Q12H Dorothea OgleIskra M Myers, MD   2 g at 07/08/15 2228  . dextromethorphan (DELSYM) 30 MG/5ML liquid 30 mg  30 mg Oral BID PRN Marcos EkeSara E Wertman, PA-C   30 mg at 07/08/15 [id number]  . famotidine (PEPCID) tablet 20 mg  20 mg Oral Daily Dorothea OgleIskra M Myers, MD   20 mg at 07/09/15 0947  . feeding supplement (BOOST / RESOURCE BREEZE) liquid 1 Container  1 Container Oral TID BM Jonah BlueJennifer Yates, MD   1 Container at 07/09/15 1000  . hydrochlorothiazide (HYDRODIURIL) tablet 25 mg  25 mg Oral q morning - 10a Marcos EkeSara E Wertman, PA-C   25 mg at 07/09/15 0948  . ipratropium-albuterol (DUONEB) 0.5-2.5 (3) MG/3ML nebulizer solution 3 mL  3 mL Nebulization Q4H PRN Marcos EkeSara E Wertman, PA-C      . ipratropium-albuterol (DUONEB) 0.5-2.5 (3) MG/3ML nebulizer solution 3 mL  3 mL Nebulization TID Marcos EkeSara E Wertman, PA-C   3 mL at 07/09/15 0906  . irbesartan (AVAPRO) tablet 300 mg  300 mg Oral q morning - 10a Marcos EkeSara E Wertman, PA-C   300 mg at 07/09/15 [id number]  . levothyroxine (SYNTHROID, LEVOTHROID) tablet 88 mcg  88 mcg Oral QAC breakfast Marcos EkeSara E Wertman, PA-C   88 mcg at 07/09/15 0844  . nebivolol (BYSTOLIC) tablet 10 mg  10 mg Oral Daily Marcos EkeSara E Wertman, PA-C   10 mg at 07/09/15 0947  . ondansetron (ZOFRAN) injection 4 mg  4 mg Intravenous Q8H PRN Leda GauzeKaren J Kirby-Graham, NP   4 mg at 07/06/15 2225  . pantoprazole (PROTONIX) EC tablet 40 mg  40 mg Oral BID AC Marcos EkeSara E Wertman, PA-C   40 mg at 07/09/15 0844  . polyvinyl alcohol (LIQUIFILM TEARS) 1.4 % ophthalmic solution 2 drop  2 drop Both Eyes PRN Marcos EkeSara E Wertman, PA-C      . predniSONE (DELTASONE) tablet 50 mg  50 mg Oral Q breakfast Dorothea OgleIskra M Myers, MD   50 mg at 07/09/15 0845  . ticagrelor (BRILINTA) tablet 90 mg  90 mg Oral BID Marcos EkeSara E Wertman, PA-C   90 mg at 07/09/15 0949  . zolpidem (AMBIEN) tablet 2.5 mg  2.5 mg Oral QHS PRN Leda GauzeKaren J Kirby-Graham, NP   2.5 mg at 07/08/15 2228      Discharge Medications: Please see discharge summary for a list of discharge medications.  Relevant Imaging Results:  Relevant Lab Results:   Additional Information SSN: [id number]  Venita Lickampbell, Teniya Filter B, LCSW

## 2015-07-09 NOTE — Clinical Social Work Placement (Signed)
   CLINICAL SOCIAL WORK PLACEMENT  NOTE  Date:  07/09/2015  Patient Details  Name: [name] MRN: [id number] Date of Birth: 01-Feb-1927  Clinical Social Work is seeking post-discharge placement for this patient at the Skilled  Nursing Facility level of care (*CSW will initial, date and re-position this form in  chart as items are completed):  Yes   Patient/family provided with [facility] Social Work Department's list of facilities offering this level of care within the geographic area requested by the patient (or if unable, by the patient's family).  Yes   Patient/family informed of their freedom to choose among providers that offer the needed level of care, that participate in Medicare, Medicaid or managed care program needed by the patient, have an available bed and are willing to accept the patient.  Yes   Patient/family informed of 's ownership interest in [facility] and [facility], as well as of the fact that they are under no obligation to receive care at these facilities.  PASRR submitted to EDS on 07/09/15     PASRR number received on 07/09/15     Existing PASRR number confirmed on       FL2 transmitted to all facilities in geographic area requested by pt/family on 07/09/15     FL2 transmitted to all facilities within larger geographic area on       Patient informed that his/her managed care company has contracts with or will negotiate with certain facilities, including the following:        Yes   Patient/family informed of bed offers received.  Patient chooses bed at [facility]     Physician recommends and patient chooses bed at      Patient to be transferred to [facility] on 07/09/15.  Patient to be transferred to facility by Ambulance     Patient family notified on 07/09/15 of transfer.  Name of family member notified:  Family at bedside     PHYSICIAN Please prepare priority discharge summary, including medications, Please  prepare prescriptions, Please sign FL2     Additional Comment:  Per MD patient ready for DC to [facility]. RN, patient, patient's family, and facility notified of DC. RN given number for report. DC packet on chart. Ambulance transport requested for patient. CSW signing off.   _______________________________________________ Venita Lickampbell, Sherril Shipman B, LCSW 07/09/2015, 3:13 PM

## 2015-07-11 ENCOUNTER — Encounter: Payer: Medicare Other | Admitting: Adult Health

## 2015-07-13 ENCOUNTER — Encounter: Payer: Self-pay | Admitting: Internal Medicine

## 2015-07-13 ENCOUNTER — Telehealth: Payer: Self-pay | Admitting: Internal Medicine

## 2015-07-13 ENCOUNTER — Non-Acute Institutional Stay (SKILLED_NURSING_FACILITY): Payer: Medicare Other | Admitting: Internal Medicine

## 2015-07-13 DIAGNOSIS — F411 Generalized anxiety disorder: Secondary | ICD-10-CM

## 2015-07-13 DIAGNOSIS — D638 Anemia in other chronic diseases classified elsewhere: Secondary | ICD-10-CM | POA: Diagnosis not present

## 2015-07-13 DIAGNOSIS — M1710 Unilateral primary osteoarthritis, unspecified knee: Secondary | ICD-10-CM

## 2015-07-13 DIAGNOSIS — E039 Hypothyroidism, unspecified: Secondary | ICD-10-CM

## 2015-07-13 DIAGNOSIS — N390 Urinary tract infection, site not specified: Secondary | ICD-10-CM | POA: Diagnosis not present

## 2015-07-13 DIAGNOSIS — J441 Chronic obstructive pulmonary disease with (acute) exacerbation: Secondary | ICD-10-CM | POA: Diagnosis not present

## 2015-07-13 DIAGNOSIS — N183 Chronic kidney disease, stage 3 unspecified: Secondary | ICD-10-CM

## 2015-07-13 DIAGNOSIS — K219 Gastro-esophageal reflux disease without esophagitis: Secondary | ICD-10-CM

## 2015-07-13 DIAGNOSIS — M179 Osteoarthritis of knee, unspecified: Secondary | ICD-10-CM

## 2015-07-13 DIAGNOSIS — I2510 Atherosclerotic heart disease of native coronary artery without angina pectoris: Secondary | ICD-10-CM | POA: Diagnosis not present

## 2015-07-13 DIAGNOSIS — J9621 Acute and chronic respiratory failure with hypoxia: Secondary | ICD-10-CM

## 2015-07-13 DIAGNOSIS — B9620 Unspecified Escherichia coli [E. coli] as the cause of diseases classified elsewhere: Secondary | ICD-10-CM

## 2015-07-13 DIAGNOSIS — D72829 Elevated white blood cell count, unspecified: Secondary | ICD-10-CM

## 2015-07-13 DIAGNOSIS — J9622 Acute and chronic respiratory failure with hypercapnia: Secondary | ICD-10-CM

## 2015-07-13 DIAGNOSIS — E43 Unspecified severe protein-calorie malnutrition: Secondary | ICD-10-CM

## 2015-07-13 DIAGNOSIS — J189 Pneumonia, unspecified organism: Secondary | ICD-10-CM

## 2015-07-13 DIAGNOSIS — R531 Weakness: Secondary | ICD-10-CM | POA: Diagnosis not present

## 2015-07-13 DIAGNOSIS — I10 Essential (primary) hypertension: Secondary | ICD-10-CM

## 2015-07-13 NOTE — Telephone Encounter (Signed)
Dr Glade LloydPandey office is calling back about this pt wants to discuss Roxanol or Xanax for the pt wants to talk to Dr Sherene SiresWert [phone]

## 2015-07-13 NOTE — Progress Notes (Signed)
LOCATION: [facility]  PCP: Darrow Bussing, MD   Code Status: DNR  Goals of care: Advanced Directive information Advanced Directives 07/13/2015  Does patient have an advance directive? Yes  Type of Advance Directive Out of facility DNR (pink MOST or yellow form)  Does patient want to make changes to advanced directive? No - Patient declined  Copy of advanced directive(s) in chart? Yes       Extended Emergency Contact Information Primary Emergency Contact: Jessee Avers Address: [address]          [place], [place] [address] [place] Home Phone: [phone] Mobile Phone: [phone] Relation: Daughter Secondary Emergency Contact: Michalski,Chris Address: [address]          [place] , [place] Phone: [phone] Relation: Son   Not on File  Chief Complaint  Patient presents with  . New Admit To SNF    New Admission     HPI:  Patient is a 82 y.o. female seen today for short term rehabilitation post hospital admission from 07/04/15-07/09/15 with acute on chronic respiratory failure with HCAP and COPD exacerbation. She was treated with antibiotics and steroids along with breathing treatment. She also has e.coli and klebsiella UTI. She has history of copd, pulmonary fibrosis, on chronic o2, CAD among others. She is seen in her room today with her son and daughter at bedside.   Review of Systems:  Constitutional: Negative for fever, chills, diaphoresis. Feels weak and tired and would like to be kept comfortable.  HENT: Negative for headache, congestion, sore throat. Has difficulty swallowing and has choking sensation Eyes: Negative for blurred vision, discharge.  Respiratory: Negative for wheezing. Positive for dry cough and shortness of breath. On 4 liters of oxygen.   Cardiovascular: Negative for chest pain, palpitations, leg swelling.  Gastrointestinal: Negative for heartburn, nausea, vomiting, abdominal pain. Last  bowel movement was in the hospital.  Genitourinary: Negative for dysuria Musculoskeletal: Negative for back pain, fall in the facility.  Skin: Negative for itching, rash.  Neurological: Negative for dizziness. Psychiatric/Behavioral: Negative for depression. Positive for anxiety.    Past Medical History  Diagnosis Date  . COPD (chronic obstructive pulmonary disease) (HCC)   . Hyperlipemia     a. reviewed with Dr. Matthias Hughs 6/17 >> ok to take statin as cirrhosis is not advanced   . Shortness of breath     with exertion  . Asthma   . Pneumonia     hx of frequent episodes of pneumonia  . Headache(784.0)     MIgraine- Visualchanges- flashing lights and cant focus and cant see.  Marland Kitchen GERD (gastroesophageal reflux disease)   . Arthritis   . Diverticulitis 2008  . Pulmonary fibrosis (HCC)   . Hypertension   . Cirrhosis of liver not due to alcohol (HCC)   . Hypothyroidism   . On home O2     2L N/C  . CAD (coronary artery disease) 06/24/2015    a   S/p NSTEMI 5/17 >> LHC - RI 60%, dRCA 95%, R post AV br 75% >> PCI: 2.75 x 24 mm Synergy DES to the distal RCA  . Ischemic cardiomyopathy 06/24/2015    a  S/p NSTEMI 5/17 //  b  Echo 06/04/15:  EF 45-50%, grade 1 diastolic dysfunction, severe inferior hypokinesis, moderate inferoseptal and apical inferior hypokinesis, mild inferolateral hypokinesis, at least mild aortic stenosis, moderate MR, moderate LAE  . Mitral regurgitation 06/24/2015    Mod eccentric MR  by echo in 5/17  . History of non-ST elevation myocardial infarction (NSTEMI)    Past Surgical History  Procedure Laterality Date  . Eye surgery  05/2008    - bilateral  . Back surgery    . Total knee arthroplasty  11/2008  . Rotator cuff repair  2008    right  . Retinal tear repair cryotherapy    . Tonsillectomy    . Appendectomy    . Dilation and curettage of uterus    . Total knee arthroplasty  07/03/2011    Procedure: TOTAL KNEE ARTHROPLASTY;  Surgeon: Valeria Batman, MD;   Location: [facility] OR;  Service: Orthopedics;  Laterality: Right;  . Transthoracic echocardiogram  04/12/2011    EF>55%; mod concentric LVH; mild-mod TR  . Varicose vein surgery    . Cardiac catheterization N/A 06/05/2015    Procedure: Left Heart Cath and Coronary Angiography;  Surgeon: Runell Gess, MD;  Location: [facility] INVASIVE CV LAB;  Service: Cardiovascular;  Laterality: N/A;  . Cardiac catheterization  06/05/2015    Procedure: Coronary Stent Intervention;  Surgeon: Runell Gess, MD;  Location: [facility] INVASIVE CV LAB;  Service: Cardiovascular;;   Social History:   reports that she quit smoking about 68 years ago. She has never used smokeless tobacco. She reports that she does not drink alcohol or use illicit drugs.  Family History  Problem Relation Age of Onset  . Multiple sclerosis Sister   . Diabetes Sister   . Heart disease Sister   . Heart attack Brother   . Stroke Maternal Grandmother   . Hypertension Sister   . Hyperlipidemia Sister   . Stroke Sister     Medications:   Medication List       This list is accurate as of: 07/13/15 12:08 PM.  Always use your most recent med list.               albuterol (5 MG/ML) 0.5% nebulizer solution  Commonly known as:  PROVENTIL  Take 2.5 mg by nebulization every 4 (four) hours as needed for wheezing or shortness of breath.     albuterol 108 (90 Base) MCG/ACT inhaler  Commonly known as:  PROVENTIL HFA;VENTOLIN HFA  Inhale 2 puffs into the lungs every 4 (four) hours as needed for wheezing or shortness of breath. For shortness of breath     amLODipine 2.5 MG tablet  Commonly known as:  NORVASC  Take 2.5 mg by mouth every morning.     aspirin 81 MG tablet  Take 81 mg by mouth daily.     atorvastatin 40 MG tablet  Commonly known as:  LIPITOR  Take 1 tablet (40 mg total) by mouth daily at 6 PM.     CALCITRATE PO  Take 600 mg by mouth daily.     ceFAZolin 2-4 GM/100ML-% IVPB  Commonly known as:  ANCEF  Inject 100 mLs (2 g total)  into the vein every 12 (twelve) hours.     chlorpheniramine 4 MG tablet  Commonly known as:  CHLOR-TRIMETON  Take 4 mg by mouth at bedtime.     DELSYM 30 MG/5ML liquid  Generic drug:  dextromethorphan  Take 30 mg by mouth 2 (two) times daily as needed for cough. Take 2 tsp twice a day as needed for cough     diphenhydramine-acetaminophen 25-500 MG Tabs tablet  Commonly known as:  TYLENOL PM  Take 1-2 tablets by mouth at bedtime as needed (sleep).     HEPARIN SODIUM  FLUSH IV  Inject 5 mLs into the vein every 12 (twelve) hours.     hydrochlorothiazide 25 MG tablet  Commonly known as:  HYDRODIURIL  Take 25 mg by mouth every morning.     ipratropium 0.06 % nasal spray  Commonly known as:  ATROVENT  Place 2 sprays into both nostrils 4 (four) times daily as needed for rhinitis.     ipratropium-albuterol 0.5-2.5 (3) MG/3ML Soln  Commonly known as:  DUONEB  Take 3 mLs by nebulization every 4 (four) hours as needed.     ipratropium-albuterol 0.5-2.5 (3) MG/3ML Soln  Commonly known as:  DUONEB  Take 3 mLs by nebulization 3 (three) times daily.     irbesartan 300 MG tablet  Commonly known as:  AVAPRO  Take 300 mg by mouth every morning.     levothyroxine 88 MCG tablet  Commonly known as:  SYNTHROID, LEVOTHROID  Take 88 mcg by mouth daily before breakfast.     Magnesium 250 MG Tabs  Take 1 tablet by mouth every morning.     methocarbamol 500 MG tablet  Commonly known as:  ROBAXIN  Take 500 mg by mouth 2 (two) times daily.     nebivolol 10 MG tablet  Commonly known as:  BYSTOLIC  Take 1 tablet (10 mg total) by mouth daily.     nitroGLYCERIN 0.4 MG SL tablet  Commonly known as:  NITROSTAT  Place 1 tablet (0.4 mg total) under the tongue every 5 (five) minutes x 3 doses as needed for chest pain.     OXYGEN  Inhale 2 L into the lungs daily as needed (for sleep and with exertion). 2 l/m with sleep and exertion     pantoprazole 40 MG tablet  Commonly known as:  PROTONIX    Take 40 mg by mouth 2 (two) times daily before a meal.     predniSONE 10 MG (21) Tbpk tablet  Commonly known as:  STERAPRED UNI-PAK 21 TAB  Take 1 tablet (10 mg total) by mouth daily. 50 mg daily x 3 days, then 40 mg daily x 3 days, then 30 mg daily x 3 days, then 20 mg daily x 3 days     predniSONE 10 MG tablet  Commonly known as:  DELTASONE  Take 1 tablet (10 mg total) by mouth daily with breakfast. Start this medication after completing the prednisone taper.     sodium chloride flush 0.9 % Soln injection  Inject 10 mLs into the vein every 12 (twelve) hours.     SYSTANE 0.4-0.3 % Soln  Generic drug:  Polyethyl Glycol-Propyl Glycol  Place 1 drop into both eyes daily as needed (for dry eyes).     ticagrelor 90 MG Tabs tablet  Commonly known as:  BRILINTA  Take 1 tablet (90 mg total) by mouth 2 (two) times daily.     traMADol 50 MG tablet  Commonly known as:  ULTRAM  TAKE 1 TABLET BY MOUTH EVERY 4 HOURS FOR PAIN OR COUGH     UNABLE TO FIND  Med Name: Med pass 120 mL 3 times daily between meals        Immunizations: Immunization History  Administered Date(s) Administered  . Influenza Whole 10/09/2011  . Influenza,inj,Quad PF,36+ Mos 10/08/2012, 11/02/2013, 10/15/2014  . Pneumococcal Conjugate-13 01/08/2013     Physical Exam: Filed Vitals:   07/13/15 1145  BP: 161/86  Pulse: 88  Temp: 96.2 F (35.7 C)  TempSrc: Oral  Height: 5\' 3"  (1.6 m)  Weight:  119 lb 3.2 oz (54.069 kg)  SpO2: 89%   Body mass index is 21.12 kg/(m^2).  General- elderly female, frail, thin built, chronically ill appearing, in moderate distress  Head- normocephalic, atraumatic Nose- no maxillary or frontal sinus tenderness, no nasal discharge Throat- dry mucus membrane  Eyes- PERRLA, EOMI, no pallor, no icterus, no discharge, normal conjunctiva, normal sclera Neck- no cervical lymphadenopathy Cardiovascular- irregular heart rate, no murmur Respiratory- bilateral poor air movement, + wheeze,  no rhonchi, +crackles, using her accessory muscles Abdomen- bowel sounds present, soft, non tender Musculoskeletal- able to move all 4 extremities, generalized weakness Neurological- alert and oriented to person, place and time Skin- warm and dry Psychiatry- anxious    Labs reviewed: Basic Metabolic Panel:  Recent Labs  91/47/8205/26/17 1615  07/07/15 1002 07/08/15 0549 07/09/15 0442  NA  --   < > 140 139 143  K  --   < > 3.4* 3.2* 3.5  CL  --   < > 101 99* 102  CO2  --   < > 29 32 29  GLUCOSE  --   < > 223* 176* 156*  BUN  --   < > 37* 38* 36*  CREATININE  --   < > 1.43* 1.51* 1.34*  CALCIUM  --   < > 9.0 8.6* 8.9  MG 2.0  --   --   --   --   < > = values in this interval not displayed. Liver Function Tests:  Recent Labs  06/24/15 1250 07/04/15 1248 07/05/15 0511  AST 22 38 28  ALT 19 49 31  ALKPHOS 67 83 69  BILITOT 0.6 1.0 1.1  PROT 6.4 5.6* 4.5*  ALBUMIN 3.8 2.8* 2.1*   No results for input(s): LIPASE, AMYLASE in the last 8760 hours. No results for input(s): AMMONIA in the last 8760 hours. CBC:  Recent Labs  07/07/15 1002 07/08/15 0549 07/09/15 0442  WBC 16.1* 11.3* 16.7*  NEUTROABS 14.7* 10.0* 14.9*  HGB 10.8* 9.5* 10.6*  HCT 33.9* 29.7* 34.3*  MCV 91.9 92.5 94.8  PLT 151 121* 198   Cardiac Enzymes:  Recent Labs  07/06/15 2201 07/07/15 0258 07/07/15 1002  TROPONINI 0.14* 0.10* 0.09*   BNP: Invalid input(s): POCBNP CBG: No results for input(s): GLUCAP in the last 8760 hours.  Radiological Exams: Dg Chest 2 View  07/05/2015  CLINICAL DATA:  Shortness of breath for several days EXAM: CHEST  2 VIEW COMPARISON:  July 04, 2015 and July 14, 2014 chest radiograph; chest CT September 29, 2014 FINDINGS: There is extensive underlying fibrosis. Lungs are somewhat hyperexpanded, likely due to underlying obstructive pulmonary disease. The overall extent of fibrosis and scarring appear stable. No frank edema or consolidation is evident. Heart is upper normal in  size, stable. The pulmonary vascularity is within normal limits. No adenopathy is evident. Bones are osteoporotic. There is postoperative change in the right shoulder. Sclerosis is noted in the right humeral head. There is lower thoracic dextroscoliosis with upper lumbar levoscoliosis. There is atherosclerotic calcification in the aorta. IMPRESSION: Underlying obstructive pulmonary disease with fibrosis, stable. No edema or consolidation is noted. Subtle infiltrate could easily be obscured given the degree of fibrosis which is present. Stable cardiac silhouette. Aortic atherosclerosis noted. Postoperative change in the right shoulder. Question a degree of avascular necrosis in the right humeral head. Electronically Signed   By: Bretta BangWilliam  Woodruff III M.D.   On: 07/05/2015 07:44   Dg Chest 2 View  07/04/2015  CLINICAL DATA:  Shortness of breath. EXAM: CHEST  2 VIEW COMPARISON:  Radiographs dated 06/03/2015, 10/12/2014 and 07/14/2014 and chest CT dated 09/29/2014 FINDINGS: The patient has severe chronic interstitial and obstructive lung disease with multiple areas of parenchymal scarring bilaterally which are stable. No acute infiltrates or effusions. Diffuse osteopenia with straightening of the thoracic kyphosis. Aortic atherosclerosis. Chronic thoracolumbar scoliosis. No acute bone abnormality. IMPRESSION: No acute abnormality.  Severe chronic lung disease. Electronically Signed   By: Francene Boyers M.D.   On: 07/04/2015 13:19   Dg Chest Port 1 View  07/06/2015  CLINICAL DATA:  Cough and shortness of breath. EXAM: PORTABLE CHEST 1 VIEW COMPARISON:  07/05/2015 FINDINGS: At 2004 hours. Lungs are hyperexpanded. Underlying chronic interstitial lung disease noted with superimposition of interstitial in diffuse patchy alveolar opacity on today's study. Cardiopericardial silhouette appears enlarged. Bones are diffusely demineralized. Telemetry leads overlie the chest. IMPRESSION: Imaging features today likely related  to pulmonary edema superimposed on advanced chronic underlying interstitial lung disease. Diffuse infection could also have this appearance. Electronically Signed   By: Kennith Center M.D.   On: 07/06/2015 20:37    Assessment/Plan  Generalized weakness From deconditioning. Will have patient work with PT/OT as tolerated to regain strength and restore function.  Fall precautions are in place. Appears to have poor prognosis. Reviewed goals of care with patient and family. Patient would like to have comfort care. Daughter is her HCPOA and would like to hold off on palliative care and hospice services for now  Acute on chronic respiratory failure With her HCAP and COPD exacerbation along with her pulmonary fibrosis. Continue her bronchodilator treatment. Continue and complete her cefazolin course. With her labored breathing, after discussion with her pulmonologist Dr Sherene Sires over the phone and her family, start roxanol 20 mg/ml at 5 mg q6h prn for dyspnea for now and xanax 0.5 mg q6h prn anxiety. Reviewed comfort care with patient and family for now, deferred per daughter's request. With her hypoxia, increased o2 to 6l/min via NRB mask for now.   Anxiety state Her air hunger and current health status are likely contributing to this. Given ativan 0.5 mg im x 1 now and start xanax 0.5 mg q6h prn and monitor  COPD exacerbation Continue and complete her tapering course of prednisone and antibiotic course. Continue bronchodilators. Continue chronic prednisone after completion of tapering course  HCAP Continue cefazolin 2 g q12h until 07/19/15. Continue breathing treatment  Protein calorie malnutrition Get RD consult and feeding supplement. Assistance with feed for now  UTI Encourage hydration and complete her antibiotic course  ckd stage 3 Monitor bmp  Anemia of chronic disease Monitor cbc  Leukocytosis Afebrile. Monitor cbc. On chronic steroids and currently on antibiotic for pneumonia and  UTI  Hypertension Monitor VS. Continue norvasc 2.5 mg daily, HCTZ 25 mg daily, bystolic 10 mg daily and irbesartan 300 mg daily. Check bmp  CAD Chest pain free at present. Continue aspirin 81 mg daily, atorvastatin 40 mg daily, irbesartan, bystolic and prn NTG. Continue her brilinta  Hypothyroidism Continue synthroid  gerd Continue pantoprazole  OA Continue her current tramadol order   Goals of care: short term rehabilitation   Labs/tests ordered: cbc, cmp 07/14/15.  Family/ staff Communication: reviewed care plan with patient, her family and nursing supervisor. Daughter would like to review on goals of care after her brothers are here from out of town. She will need palliative care consult once family agrees.     Oneal Grout, MD Internal Medicine [facility]  [facility] [address] [place], [place] [address] Cell Phone (Monday-Friday 8 am - 5 pm): [phone] On Call: [phone] and follow prompts after 5 pm and on weekends Office Phone: [phone] Office Fax: [phone]

## 2015-07-13 NOTE — Telephone Encounter (Signed)
Discussed with dr Glade LloydPandey and daughter, shifting over to a palliative approach is appropriate at this point

## 2015-07-13 NOTE — Telephone Encounter (Signed)
Spoke with pt's daughter and she states that pt has been dc'd to [facility] for rehab from recent hospitalization for PNA. Pt's dau states that pt has not recovered and is not doing well. She reports that pt's O2 sats are dropping into the 70's with even minimal exertion. Pt was on 2L O2 continuous but facility has increased to 3.5L. Pt is still having problems with O2 sats. Pt's dau is not sure who should be caring for pt and states that "MW did not even see her when she was in hospital". Pt had appt on 7/3 with TP but dau thinks facility was not aware to bring her. She states that pt could not travel due to condition anyway. When asked if she could bring pt in to be seen she said "absolutely not, she would not make it". She also reports that pt "seems to have given up". She is very concerned about her mother's condition.  MW Please advise. Thanks!  LOV  05/27/15  Patient Instructions     Prednisone 10 mg 2 daily with breakfast until back to your normal self, then one daily until return  Add Pepcid ac 20 mg at bedtime   See Tammy NP In 4-6 weeks with all your medications, even over the counter meds, separated in two separate bags, the ones you take no matter what vs the ones you stop once you feel better and take only as needed when you feel you need them. Tammy will generate for you a new user friendly medication calendar that will put us all on the same page re: your medication use.    Without this process, it simply isn't possible to assure that we are providing your outpatient care with the attention to detail we feel you deserve. If we cannot assure that you're getting that kind of care, then we cannot manage your problem effectively from this clinic.  Once you have seen Tammy and we are sure that we're all on the same page with your medication use she will arrange follow up with me.  Add be sure using calcium gluconate not carbonate

## 2015-07-14 LAB — CULTURE, BLOOD (ROUTINE X 2)
CULTURE: NO GROWTH
Culture: NO GROWTH

## 2015-08-01 ENCOUNTER — Ambulatory Visit: Payer: Medicare Other | Admitting: Internal Medicine

## 2015-08-09 DEATH — deceased

## 2015-08-18 ENCOUNTER — Ambulatory Visit: Payer: Medicare Other | Admitting: Internal Medicine

## 2016-07-24 IMAGING — CT CT ANGIO CHEST
2 of 6 series · 18 of 36 positions shown · IV contrast (OMNIPAQUE)
Comparison: Radiograph dated 09/27/2014 and chest CT dated
08/18/2012

CLINICAL DATA: 87-year-old female with productive cough and
hemoptysis and congestion.

EXAM:
CT ANGIOGRAPHY CHEST WITH CONTRAST
TECHNIQUE: Multidetector CT imaging of the chest was performed using the
standard protocol during bolus administration of intravenous
contrast. Multiplanar CT image reconstructions and MIPs were
obtained to evaluate the vascular anatomy.
CONTRAST:  80mL OMNIPAQUE IOHEXOL 350 MG/ML SOLN

[Series 6: pe thins @ 1mm · axial · 0.59mm/px · z∈[+1158,+1376]mm · 17 of 245 slices shown]
[im 13/245  lung]
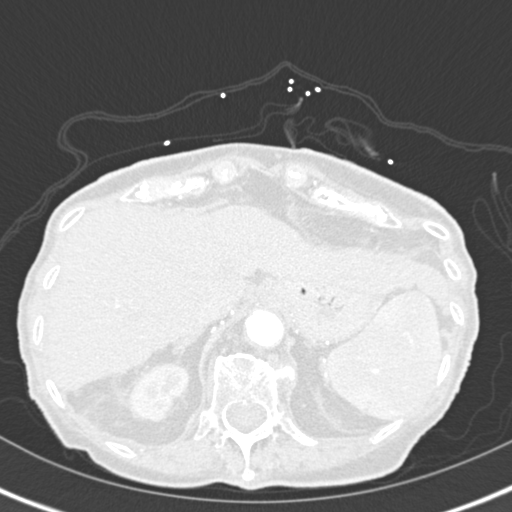
[im 25/245  mediastinal]
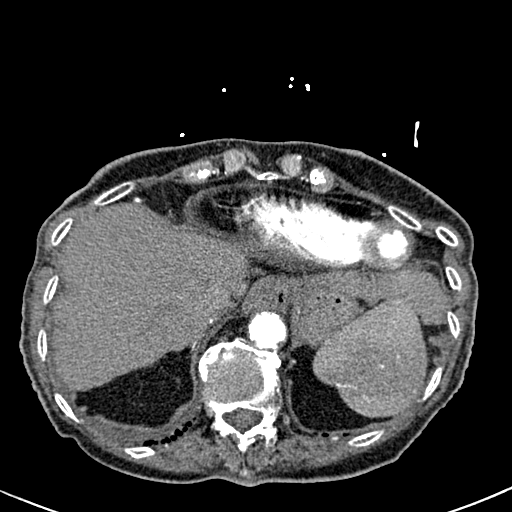
[im 37/245  lung]
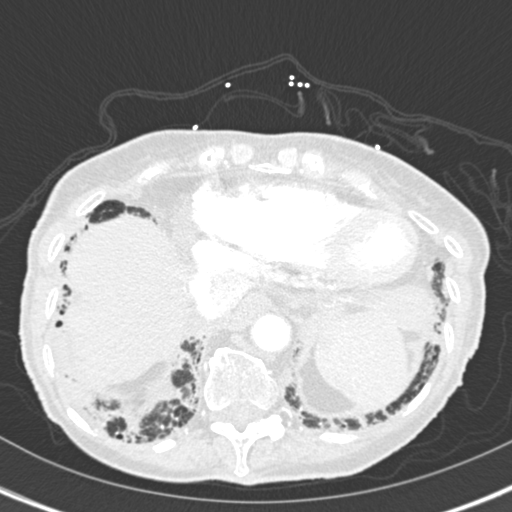
[im 49/245  mediastinal]
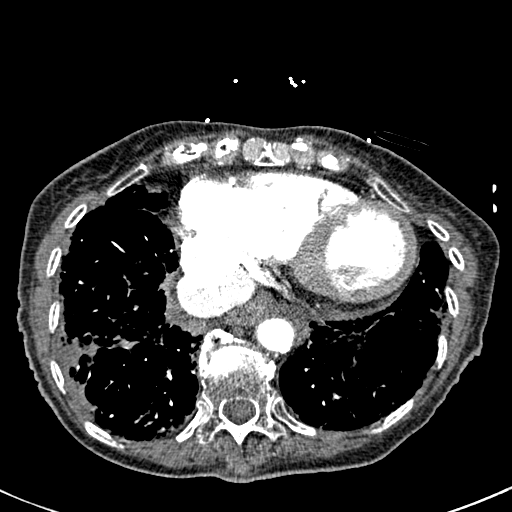
[im 74/245  lung]
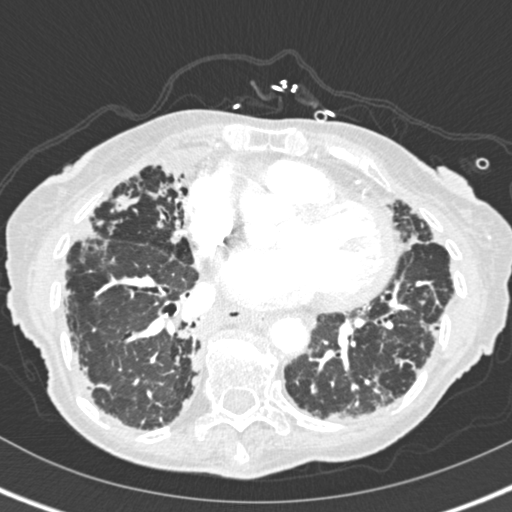
[im 86/245  mediastinal]
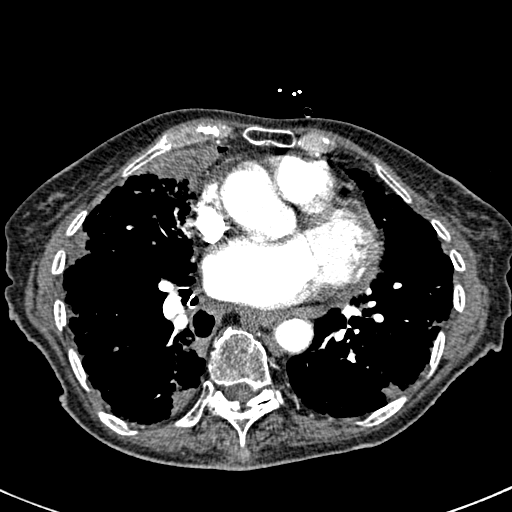
[im 98/245  lung]
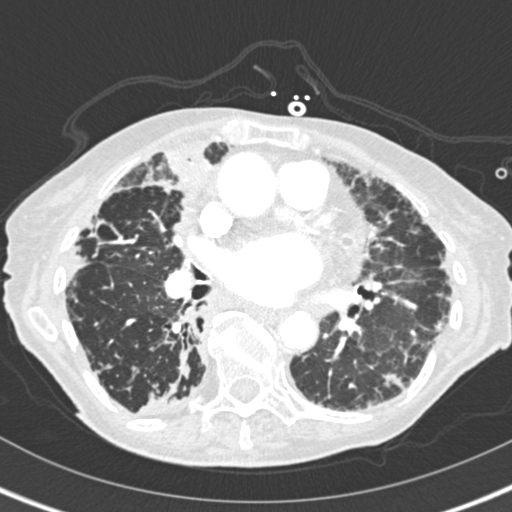
[im 110/245  mediastinal]
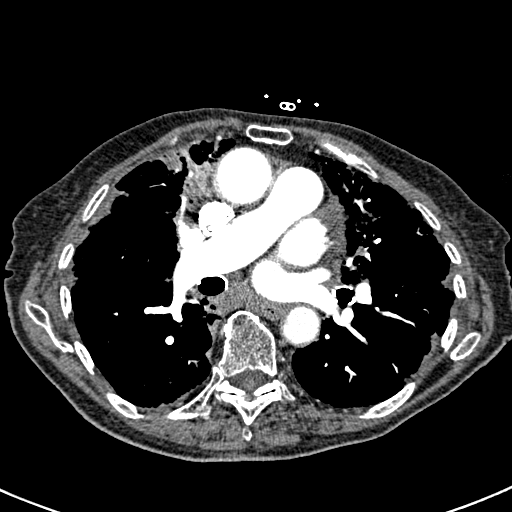
[im 123/245  lung]
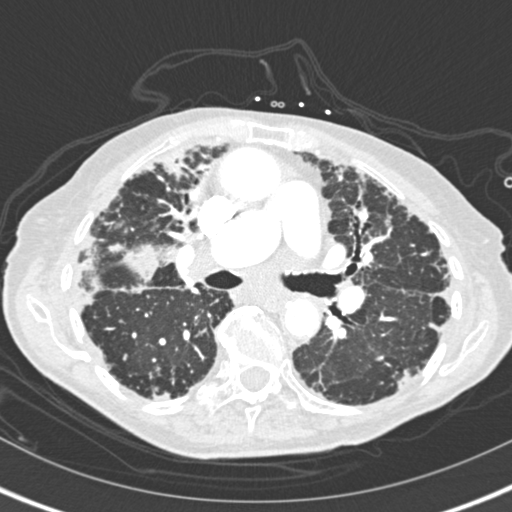
[im 135/245  mediastinal]
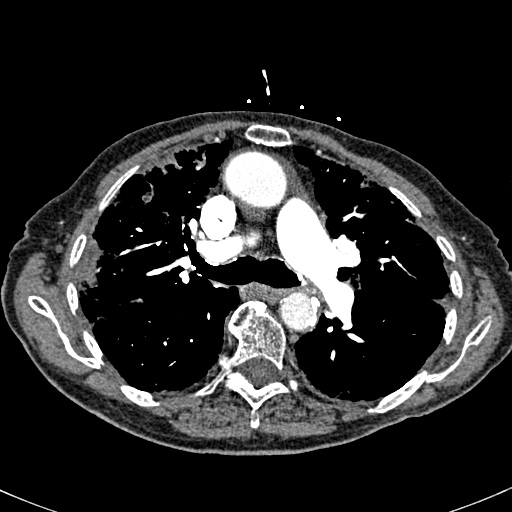
[im 147/245  lung]
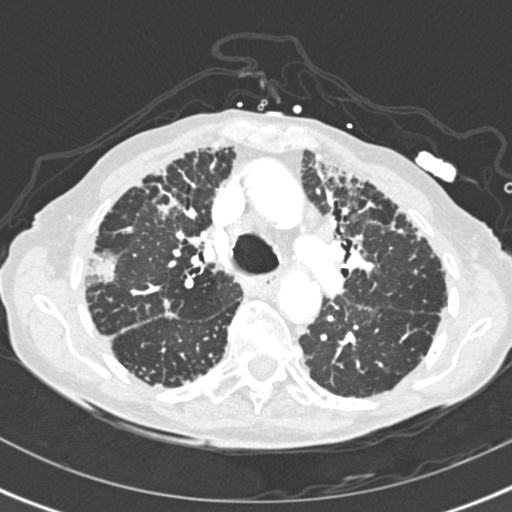
[im 159/245  mediastinal]
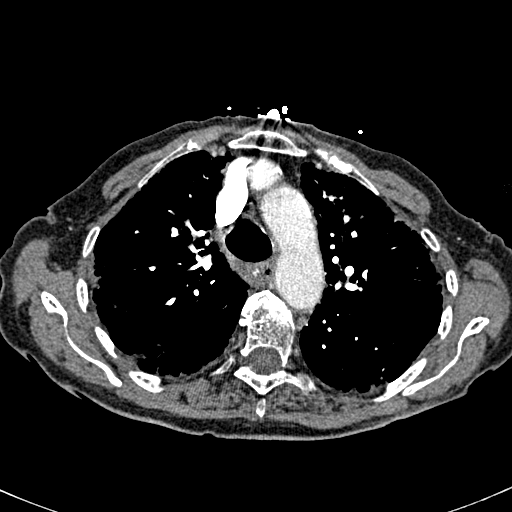
[im 171/245  lung]
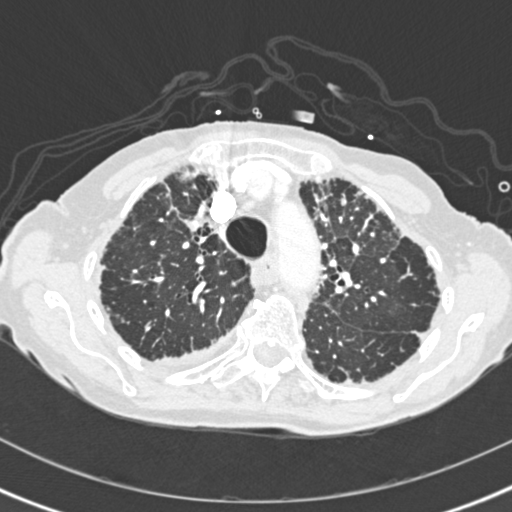
[im 196/245  mediastinal]
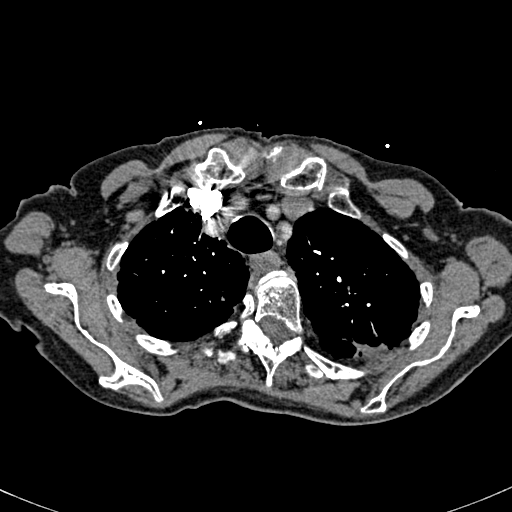
[im 208/245  lung]
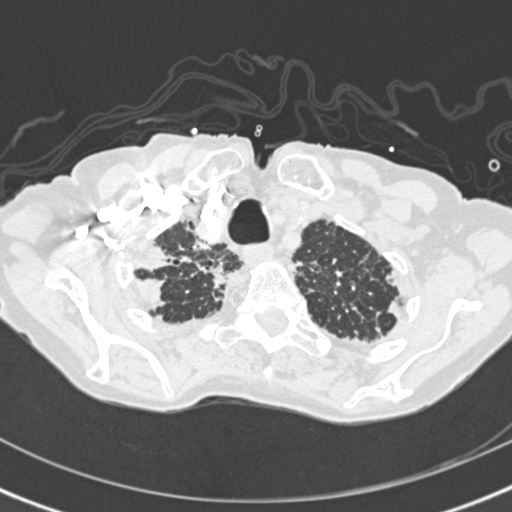
[im 220/245  mediastinal]
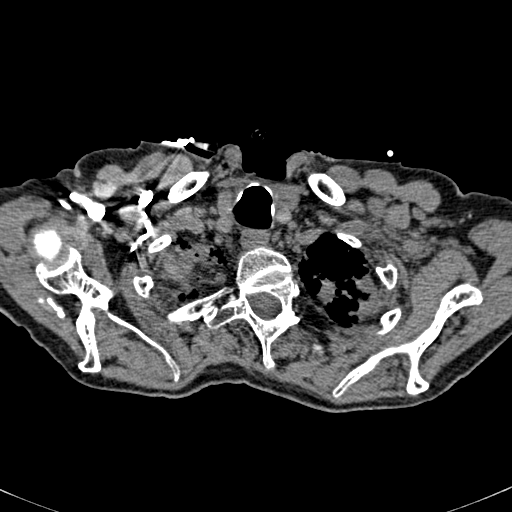
[im 232/245  lung]
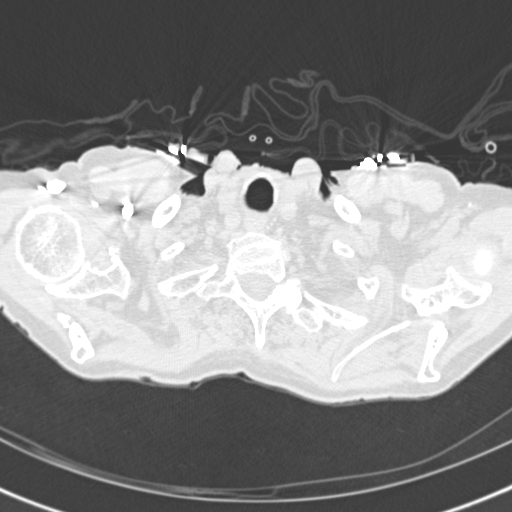

[Series 602: <mpr thick range> · coronal · 0.59mm/px · 1 of 103 slices shown]
[im 52/103  mediastinal]
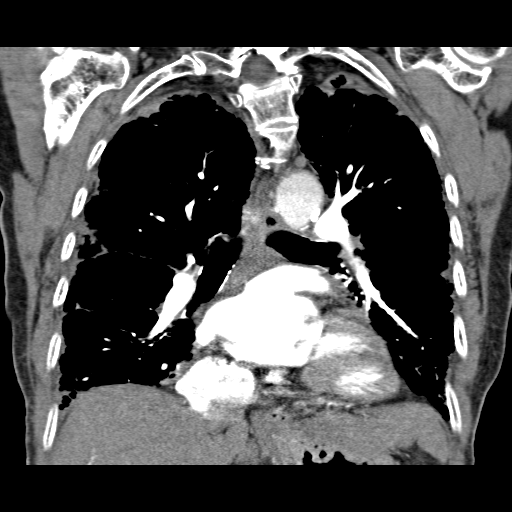

[18 of 36 positions shown; findings below may reference images not displayed]

FINDINGS: There are emphysematous changes of the lungs. Patchy areas of
airspace opacity predominantly in the subpleural regions bilaterally
noted some of which may be chronic and on there is likely represent
multifocal pneumonia. Clinical correlation and follow-up to
resolution recommended. There is a focal area consolidation in the
right middle lobe. There are cavitary lesions in the paramediastinal
and infrahilar aspect of the right lower lobe, likely infectious in
etiology. A cavitary neoplasm is not excluded. Clinical correlation
and follow-up recommended. No pleural effusion or pneumothorax. The
central airways are patent.

The thoracic aorta appears unremarkable. There are atherosclerotic
calcifications of the thoracic aorta. No CT evidence of pulmonary
embolism.

Right hilar adenopathy. There is mild soft tissue thickening of the
left infrahilar pericardial. Cardiomegaly. No significant
pericardial effusion. Small hiatal hernia. The esophagus is
predominantly collapsed.

No axillary adenopathy. The chest wall soft tissues appear
unremarkable. There is extensive degenerative changes of the spine.
Scoliosis of the thoracic spine. No acute fracture.

There is heterogeneous enhancement of the spleen.

Review of the MIP images confirms the above findings.
IMPRESSION: No CT evidence of pulmonary embolism.

Multifocal bilateral airspace consolidation most compatible with
pneumonia. Clinical correlation and follow-up two resolution
recommended.

Right infrahilar/paramediastinal cavitary consolidation.
Differential includes fungal infection, abscess, TB, or cavitary
neoplasm. Correlation with sputum cultures and pulmonary consult is
advised. Bronchoscopy may provide better evaluation.

## 2017-04-30 IMAGING — CR DG CHEST 1V PORT
1 series · 1 of 1 positions shown · non-contrast
Comparison: 07/05/2015

CLINICAL DATA: Cough and shortness of breath.

EXAM:
PORTABLE CHEST 1 VIEW

[AP]
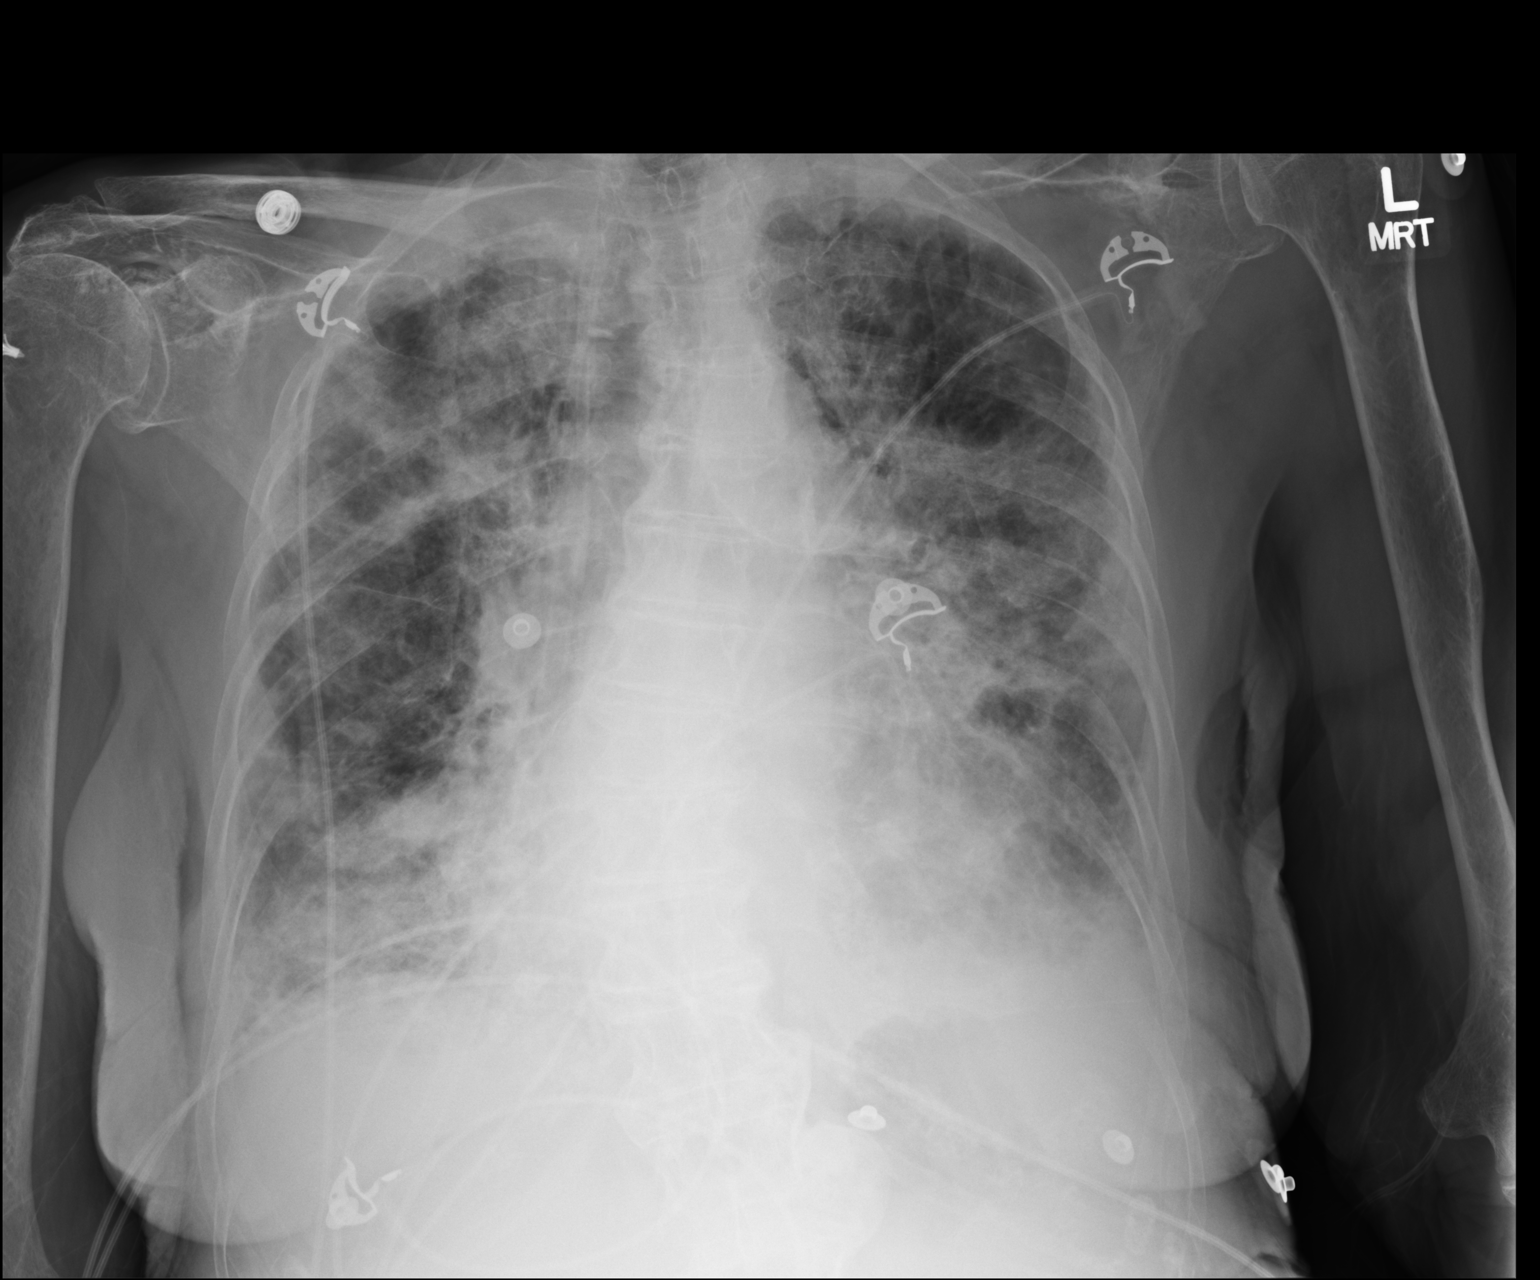

[1 of 1 positions shown; findings below may reference images not displayed]

FINDINGS: At 0886 hours. Lungs are hyperexpanded. Underlying chronic
interstitial lung disease noted with superimposition of interstitial
in diffuse patchy alveolar opacity on today's study.
Cardiopericardial silhouette appears enlarged. Bones are diffusely
demineralized. Telemetry leads overlie the chest.
IMPRESSION: Imaging features today likely related to pulmonary edema
superimposed on advanced chronic underlying interstitial lung
disease. Diffuse infection could also have this appearance.
